# Patient Record
Sex: Female | Born: 1950
Health system: Southern US, Community
[De-identification: ages and names within clinical notes are randomized; demographics above are authoritative.]

## PROBLEM LIST (undated history)

## (undated) DIAGNOSIS — T8859XA Other complications of anesthesia, initial encounter: Secondary | ICD-10-CM

## (undated) DIAGNOSIS — Z9889 Other specified postprocedural states: Secondary | ICD-10-CM

## (undated) DIAGNOSIS — T4145XA Adverse effect of unspecified anesthetic, initial encounter: Secondary | ICD-10-CM

## (undated) DIAGNOSIS — E785 Hyperlipidemia, unspecified: Secondary | ICD-10-CM

## (undated) DIAGNOSIS — R112 Nausea with vomiting, unspecified: Secondary | ICD-10-CM

---

## 1997-12-23 ENCOUNTER — Other Ambulatory Visit: Admission: RE | Admit: 1997-12-23 | Discharge: 1997-12-23 | Payer: Self-pay | Admitting: Obstetrics & Gynecology

## 1999-02-02 ENCOUNTER — Other Ambulatory Visit: Admission: RE | Admit: 1999-02-02 | Discharge: 1999-02-02 | Payer: Self-pay | Admitting: Obstetrics & Gynecology

## 2000-06-19 ENCOUNTER — Other Ambulatory Visit: Admission: RE | Admit: 2000-06-19 | Discharge: 2000-06-19 | Payer: Self-pay | Admitting: Obstetrics & Gynecology

## 2001-02-26 HISTORY — PX: HEMORRHOID SURGERY: SHX153

## 2002-06-10 ENCOUNTER — Other Ambulatory Visit: Admission: RE | Admit: 2002-06-10 | Discharge: 2002-06-10 | Payer: Self-pay | Admitting: Obstetrics & Gynecology

## 2003-10-19 ENCOUNTER — Other Ambulatory Visit: Admission: RE | Admit: 2003-10-19 | Discharge: 2003-10-19 | Payer: Self-pay | Admitting: Obstetrics & Gynecology

## 2005-01-08 ENCOUNTER — Other Ambulatory Visit: Admission: RE | Admit: 2005-01-08 | Discharge: 2005-01-08 | Payer: Self-pay | Admitting: Obstetrics & Gynecology

## 2008-04-08 ENCOUNTER — Ambulatory Visit (HOSPITAL_COMMUNITY): Admission: RE | Admit: 2008-04-08 | Discharge: 2008-04-08 | Payer: Self-pay | Admitting: Surgery

## 2008-04-08 ENCOUNTER — Encounter (INDEPENDENT_AMBULATORY_CARE_PROVIDER_SITE_OTHER): Payer: Self-pay | Admitting: Surgery

## 2010-06-13 LAB — CBC
HCT: 32 % — ABNORMAL LOW (ref 36.0–46.0)
Hemoglobin: 10.2 g/dL — ABNORMAL LOW (ref 12.0–15.0)
MCHC: 31.9 g/dL (ref 30.0–36.0)
MCV: 72.5 fL — ABNORMAL LOW (ref 78.0–100.0)
RBC: 4.42 MIL/uL (ref 3.87–5.11)

## 2010-06-13 LAB — COMPREHENSIVE METABOLIC PANEL
ALT: 25 U/L (ref 0–35)
CO2: 29 mEq/L (ref 19–32)
Calcium: 9.6 mg/dL (ref 8.4–10.5)
Creatinine, Ser: 0.9 mg/dL (ref 0.4–1.2)
GFR calc non Af Amer: >60 mL/min (ref >60)
Glucose, Bld: 109 mg/dL — ABNORMAL HIGH (ref 70–99)

## 2010-06-13 LAB — DIFFERENTIAL
Eosinophils Absolute: 0.1 10*3/uL (ref 0.0–0.7)
Lymphocytes Relative: 34 % (ref 12–46)
Lymphs Abs: 1.8 10*3/uL (ref 0.7–4.0)
Neutrophils Relative %: 54 % (ref 43–77)

## 2010-07-11 NOTE — Op Note (Signed)
NAME:  Nichole Cunningham, Nichole Cunningham                ACCOUNT NO.:  [id number]   MEDICAL RECORD NO.:  [id number]          PATIENT TYPE:  AMB   LOCATION:  DAY                          FACILITY:  [facility]   PHYSICIAN:  Thomas A. Cornett, M.D.DATE OF BIRTH:  [birth year]   DATE OF PROCEDURE:  04/08/2008  DATE OF DISCHARGE:                               OPERATIVE REPORT   PREOPERATIVE DIAGNOSES:  Grade 3 complex internal and external  hemorrhoids.   POSTOPERATIVE DIAGNOSES:  Grade 3 complex internal and external  hemorrhoids.   PROCEDURE:  Three column internal/external hemorrhoidectomy.   SURGEON:  Maisie Fus A. Cornett, M.D.   ANESTHESIA:  General endotracheal anesthesia.   ESTIMATED BLOOD LOSS:  60 mL.   SPECIMEN:  Multiple hemorrhoid columns, sent to pathology.   DRAINS:  None.   INDICATIONS FOR PROCEDURE:  The patient has a longstanding history of  complex internal and external hemorrhoids.  She has been treated in the  past with sclerotherapy but hemorrhoids  have recurred.  She has a  significant external component and therefore we talked about those  being stapled . I went over the pros and cons of each.  She decided to  go ahead and proceed with a hemorrhoidectomy   DESCRIPTION OF PROCEDURE:  The patient was brought to the operating room  and intubated.  The patient was placed in jackknifeprone position and  appropriately  padded.  The buttocks were taped apart.  The perineum was  prepped and draped in a sterile fashion.  The digital exam was done and  tone was normal.  A stitchwas placed to the right posterior and inferior  hemmorhoid complexes.  The right posterior column was addressed first.  A  stitch  was placed  at the apex. We excised the hemmohoid both  internal and external components and removeda  skin tag in its entirety  with care  taken not to injure the sphincter mechanism.  This was easily  seen and we avoided this.  We closed the  mucosa with a running #3-0  Monocryl .  In a  similar fashion the other two areas were grasped with  #3-0 Monocryl with a running tack stitch.  The excess tissue excised,  preserving the sphincter muscle that we could see and _avoid.The right  anterior,  right posterior and left lateral piles were excised.  Once  the three columns were addressed and hemostasis achieved irrigation  within  the rectum was done which was clear without significant  bleeding.  Packing of Gelfoam wrap with Surgicel was placed in the anal  canal.  We injected the entire anal region  with 0.25%sensoricaine  locally with epinephrine. Xylocaine gel was then  applied.  The patient was then placed supine at this point.  All sponge,  needle and instrument counts were correct.   The patient was awakened and taken to recovery room in satisfactory  condition.      Thomas A. Cornett, M.D.  Electronically Signed     TAC/MEDQ  D:  04/08/2008  T:  04/08/2008  Job:  [id number]   cc:   Dr. __________

## 2010-09-07 ENCOUNTER — Encounter: Payer: Self-pay | Admitting: Internal Medicine

## 2010-11-30 ENCOUNTER — Other Ambulatory Visit: Payer: Self-pay | Admitting: Obstetrics & Gynecology

## 2011-04-01 ENCOUNTER — Other Ambulatory Visit: Payer: Self-pay

## 2011-04-01 ENCOUNTER — Emergency Department (HOSPITAL_COMMUNITY): Payer: Managed Care, Other (non HMO)

## 2011-04-01 ENCOUNTER — Encounter (HOSPITAL_COMMUNITY): Payer: Self-pay | Admitting: *Deleted

## 2011-04-01 ENCOUNTER — Emergency Department (HOSPITAL_COMMUNITY)
Admission: EM | Admit: 2011-04-01 | Discharge: 2011-04-01 | Disposition: A | Discharge status: Home / Self Care | Payer: Managed Care, Other (non HMO) | Attending: Emergency Medicine | Admitting: Emergency Medicine

## 2011-04-01 DIAGNOSIS — K829 Disease of gallbladder, unspecified: Secondary | ICD-10-CM | POA: Insufficient documentation

## 2011-04-01 DIAGNOSIS — E785 Hyperlipidemia, unspecified: Secondary | ICD-10-CM | POA: Insufficient documentation

## 2011-04-01 DIAGNOSIS — R079 Chest pain, unspecified: Secondary | ICD-10-CM | POA: Insufficient documentation

## 2011-04-01 DIAGNOSIS — I491 Atrial premature depolarization: Secondary | ICD-10-CM | POA: Insufficient documentation

## 2011-04-01 DIAGNOSIS — R10816 Epigastric abdominal tenderness: Secondary | ICD-10-CM | POA: Insufficient documentation

## 2011-04-01 DIAGNOSIS — M549 Dorsalgia, unspecified: Secondary | ICD-10-CM | POA: Insufficient documentation

## 2011-04-01 DIAGNOSIS — R1011 Right upper quadrant pain: Secondary | ICD-10-CM | POA: Insufficient documentation

## 2011-04-01 HISTORY — DX: Hyperlipidemia, unspecified: E78.5

## 2011-04-01 LAB — HEPATIC FUNCTION PANEL
ALT: 47 U/L — ABNORMAL HIGH (ref 0–35)
AST: 40 U/L — ABNORMAL HIGH (ref 0–37)
Albumin: 3.9 g/dL (ref 3.5–5.2)
Alkaline Phosphatase: 85 U/L (ref 39–117)
Total Protein: 7.1 g/dL (ref 6.0–8.3)

## 2011-04-01 LAB — DIFFERENTIAL
Eosinophils Relative: 2 % (ref 0–5)
Lymphocytes Relative: 27 % (ref 12–46)
Lymphs Abs: 2.5 10*3/uL (ref 0.7–4.0)
Neutro Abs: 6.4 10*3/uL (ref 1.7–7.7)

## 2011-04-01 LAB — BASIC METABOLIC PANEL
CO2: 27 mEq/L (ref 19–32)
Chloride: 102 mEq/L (ref 96–112)
Glucose, Bld: 156 mg/dL — ABNORMAL HIGH (ref 70–99)
Potassium: 4.1 mEq/L (ref 3.5–5.1)
Sodium: 139 mEq/L (ref 135–145)

## 2011-04-01 LAB — CBC
MCV: 90.4 fL (ref 78.0–100.0)
Platelets: 208 10*3/uL (ref 150–400)
RBC: 4.38 MIL/uL (ref 3.87–5.11)
WBC: 9.4 10*3/uL (ref 4.0–10.5)

## 2011-04-01 MED ORDER — SODIUM CHLORIDE 0.9 % IV SOLN
Freq: Once | INTRAVENOUS | Status: AC
  Administered 2011-04-01: 08:00:00 via INTRAVENOUS

## 2011-04-01 MED ORDER — PROMETHAZINE HCL 25 MG PO TABS: 25.0000 mg | ORAL_TABLET | Freq: Four times a day (QID) | ORAL | Status: DC | PRN

## 2011-04-01 MED ORDER — HYDROCODONE-ACETAMINOPHEN 5-325 MG PO TABS: 1.0000 | ORAL_TABLET | Freq: Four times a day (QID) | ORAL | Status: DC | PRN

## 2011-04-01 MED ORDER — HYDROMORPHONE HCL PF 1 MG/ML IJ SOLN
1.0000 mg | Freq: Once | INTRAMUSCULAR | Status: AC
  Administered 2011-04-01: 1 mg via INTRAVENOUS
  Filled 2011-04-01: qty 1

## 2011-04-01 MED ORDER — PANTOPRAZOLE SODIUM 40 MG IV SOLR
40.0000 mg | Freq: Once | INTRAVENOUS | Status: AC
  Administered 2011-04-01: 40 mg via INTRAVENOUS
  Filled 2011-04-01: qty 40

## 2011-04-01 MED ORDER — ONDANSETRON HCL 4 MG/2ML IJ SOLN
4.0000 mg | Freq: Once | INTRAMUSCULAR | Status: AC
  Administered 2011-04-01: 4 mg via INTRAVENOUS
  Filled 2011-04-01: qty 2

## 2011-04-01 NOTE — ED Notes (Signed)
Patient actively vomiting at this time. Dr Estell Harpin made aware and order for Zofran obtained.

## 2011-04-01 NOTE — ED Notes (Signed)
Pt arrived at department via EMS. Pt reports being awoken with pain in center of chest. Reports pain moves to left side somewhat, and into back.  Pt denies any nausea or SOB.  Nitro and ASA given by EMS.

## 2011-04-01 NOTE — ED Provider Notes (Signed)
History  Scribed for [name], MD, the patient was seen in room APA04/APA04. This chart was scribed by Candelaria Stagers. The patient's care started at 7:13AM      CSN: [id number]  Arrival date & time 04/01/11  2725   First MD Initiated Contact with Patient 04/01/11 [phone]      Chief Complaint  Patient presents with  . Chest Pain      Patient is a 61 y.o. female presenting with chest pain. The history is provided by the patient.  Chest Pain The chest pain began 1 - 2 hours ago. Chest pain occurs constantly. The pain radiates to the left arm. Pertinent negatives for primary symptoms include no fever, no cough and no nausea.  Pertinent negatives for associated symptoms include no diaphoresis. She tried nothing for the symptoms.  Her family medical history is significant for TIA in family.    Nichole Cunningham is a 61 y.o. female who presents to the Emergency Department complaining of constant chest pain in the center of the chest that started several hours ago.  Pt states the pain has moved to the left side of chest and that she is also experiencing back pain.  She denies nausea, diaphoresis, fever, or cough.  She reports that she has never experienced these sx before.  She is currently taking cholesterol medicines.  Pt's father died of heart attack in his early 82's.    Patient is a 61 y.o. female presenting with chest pain. The history is provided by the patient.    Past Medical History  Diagnosis Date  . Hyperlipidemia     History reviewed. No pertinent past surgical history.  History reviewed. No pertinent family history.  History  Substance Use Topics  . Smoking status: Never Smoker   . Smokeless tobacco: Not on file  . Alcohol Use: No    OB History    Grav Para Term Preterm Abortions TAB SAB Ect Mult Living                  Review of Systems  Constitutional: Negative for fever and diaphoresis.  Respiratory: Negative for cough.   Cardiovascular: Positive for  chest pain.  Gastrointestinal: Negative for nausea.  Musculoskeletal: Positive for back pain.  All other systems reviewed and are negative.    Allergies  Review of patient's allergies indicates no known allergies.  Home Medications   Current Outpatient Rx  Name Route Sig Dispense Refill  . ROSUVASTATIN CALCIUM 10 MG PO TABS Oral Take 10 mg by mouth daily.    Marland Kitchen ZOLPIDEM TARTRATE 5 MG PO TABS Oral Take 5 mg by mouth at bedtime as needed.      BP 122/91  Pulse 57  Temp 97.5 F (36.4 C)  Resp 16  Ht 5\' 3"  (1.6 m)  Wt 146 lb (66.225 kg)  BMI 25.86 kg/m2  SpO2 100%  Physical Exam  Nursing note and vitals reviewed. Constitutional: She is oriented to person, place, and time. She appears well-developed and well-nourished.  HENT:  Head: Atraumatic.  Eyes: EOM are normal. Right eye exhibits no discharge. Left eye exhibits no discharge.  Neck: Normal range of motion. Neck supple.  Pulmonary/Chest: Effort normal. She exhibits no tenderness.  Abdominal: Soft. There is tenderness (epigastric tenderness). There is no rebound.  Musculoskeletal: She exhibits no tenderness.       Baseline ROM, no obvious new focal weakness.  Neurological: She is alert and oriented to person, place, and time.  Skin: Skin  is warm and dry. No rash noted. She is not diaphoretic.  Psychiatric: She has a normal mood and affect. Her behavior is normal.    ED Course  Procedures   DIAGNOSTIC STUDIES: Oxygen Saturation is 100% on room air, normal by my interpretation.    COORDINATION OF CARE:  7:02AM Ordered: ED EKG ; Cardiac monitoring ; Pulse oximetry, continuous ; Saline lock IV ; CBC; Differential ; Basic metabolic panel ; I-Stat tropoinin I cardiac marker ; DG Chest Portable 1 View  7:19AM Ordered: Lipase, blood ; Hepatic function panel ; 0.9 % sodium chloride infusion ; HYDROmorphone (DILAUDID) injection 1 mg ; ondansetron (ZOFRAN) injection 4 mg ; pantoprazole (PROTONIX) injection 40 mg ; US Abdomen  Complete  8:07AM Ordered: ondansetron (ZOFRAN) injection 4 mg  10:29 AM  Recheck: discussed test results and course of care, recommended follow up with surgeon, pt is feeling nauseated.      Labs Reviewed  BASIC METABOLIC PANEL - Abnormal; Notable for the following:    Glucose, Bld 156 (*)    GFR calc non Af Amer 79 (*)    All other components within normal limits  HEPATIC FUNCTION PANEL - Abnormal; Notable for the following:    AST 40 (*)    ALT 47 (*)    All other components within normal limits  CBC  DIFFERENTIAL  LIPASE, BLOOD  POCT I-STAT TROPONIN I   US Abdomen Limited  04/01/2011  *RADIOLOGY REPORT*  Clinical Data:  Right upper quadrant abdominal pain  LIMITED ABDOMINAL ULTRASOUND - RIGHT UPPER QUADRANT  Comparison:  None.  Findings:  Gallbladder:  Dependent sludge noted without gallbladder wall thickening, pericholecystic fluid, or sonographic Murphy's sign.  Common bile duct:  Within normal limits in caliber.  Liver:  The liver is increased in echogenicity with poor sound through transmission, suggestive of fatty infiltration.A few areas of relative hypo echogenicity may indicate fatty sparing.  This is nonspecific however.  IMPRESSION: Sludge in the gallbladder without other sonographic evidence for acute cholecystitis.  Hepatic steatosis with areas of probable focal sparing.  If there is any underlying liver disease or primary malignancy, consider outpatient liver mass protocol MRI with contrast for better visualization of possible underlying lesions.  Original Report Authenticated By: Harrel Lemon, M.D.   Dg Chest Portable 1 View  04/01/2011  *RADIOLOGY REPORT*  Clinical Data: Chest pain  PORTABLE CHEST - 1 VIEW  Comparison: 04/08/2008  Findings: Cardiomediastinal silhouette is within normal limits. The lungs are clear. No pleural effusion.  No pneumothorax.  No acute osseous abnormality.  IMPRESSION: Normal chest.  Original Report Authenticated By: Harrel Lemon, M.D.       No diagnosis found.  Date: 04/01/2011  Rate: 67  Rhythm: premature atrial contractions (PAC)  QRS Axis: normal  Intervals: normal  ST/T Wave abnormalities: normal  Conduction Disutrbances:none  Narrative Interpretation:   Old EKG Reviewed: none available    MDM   Abd, pain,  Sludge in gb   The chart was scribed for me under my direct supervision.  I personally performed the history, physical, and medical decision making and all procedures in the evaluation of this patient.[name], MD 04/01/11 1038

## 2011-04-01 NOTE — ED Notes (Signed)
Patient transported to Ultrasound 

## 2011-04-01 NOTE — ED Notes (Signed)
New dark green tube drawn via venipuncture from right wrist. 2nd IStat troponin initiated. Patient tolerated procedure well.

## 2011-04-01 NOTE — ED Notes (Signed)
Patient ambulatory to restroom with steady gait. Patient reattached to cardiac monitor upon return to room.

## 2011-04-01 NOTE — ED Notes (Signed)
Patient with no complaints at this time. Respirations even and unlabored. Skin warm/dry. Discharge instructions reviewed with patient at this time. Patient given opportunity to voice concerns/ask questions. IV removed per policy and band-aid applied to site. Patient discharged at this time and left Emergency Department with steady gait.  

## 2011-04-06 ENCOUNTER — Encounter (HOSPITAL_COMMUNITY): Payer: Self-pay | Admitting: Pharmacy Technician

## 2011-04-11 ENCOUNTER — Encounter (HOSPITAL_COMMUNITY): Payer: Self-pay

## 2011-04-11 ENCOUNTER — Encounter (HOSPITAL_COMMUNITY)
Admission: RE | Admit: 2011-04-11 | Discharge: 2011-04-11 | Disposition: A | Discharge status: Home / Self Care | Payer: Managed Care, Other (non HMO) | Source: Ambulatory Visit | Attending: General Surgery | Admitting: General Surgery

## 2011-04-11 HISTORY — DX: Other complications of anesthesia, initial encounter: T88.59XA

## 2011-04-11 HISTORY — DX: Other specified postprocedural states: Z98.890

## 2011-04-11 HISTORY — DX: Nausea with vomiting, unspecified: R11.2

## 2011-04-11 HISTORY — DX: Adverse effect of unspecified anesthetic, initial encounter: T41.45XA

## 2011-04-11 NOTE — H&P (Signed)
  NTS SOAP Note  Vital Signs:  Vitals as of: 04/05/2011: Systolic 141: Diastolic 94: Heart Rate 75: Temp 99.56F: Height 15ft 3in: Weight 149Lbs 0 Ounces: OFC 0in: Respiratory Rate 0: O2 Saturation 0: Pain Level 3: BMI 26  BMI : 26.39 kg/m2  Subjective: This 7 Years 2 Months old Female presents forof R sided abdominal pain.  Has had slowly progressing symptoms.  Last sunday pain awoke her from sleep. Colicky in nature.  Associated nausea and emesis.  No change in BM.  No melena, hematochezia.  No diarrhea.  No constipation.  Has not noted any significant change with fatty foods.  Does have pressure pain occassionally.  No jaundice.  No family history of biliary disease.  Review of Symptoms:  Constitutional:unremarkable Head:unremarkable Eyes:unremarkable Nose/Mouth/Throat:unremarkable Cardiovascular:unremarkable Respiratory:unremarkable as per HPI Genitourinary:unremarkable arthralgia. Skin:unremarkable Breast:unremarkable Hematolgic/Lymphatic:unremarkable Allergic/Immunologic:unremarkable   Past Medical History:Obtained   Past Medical History  Pregnancy Gravida: 2 Pregnancy Para: 2 Surgical History: tubal Medical Problems: insomnia, hyperlipidemia Psychiatric History: none Allergies: NKDA Medications: Antara, ambien   Social History:Obtained   Social History  No EtOH No rec drugs.   Smoking Status: Never smoker reviewed on 04/08/2011  Family History:Obtained   Family History  noncontributory    Objective Information: General:Well appearing, well nourished in no distress. Skin:no rash or prominent lesions Head:Atraumatic; no masses; no abnormalities Eyes:conjunctiva clear, EOM intact, PERRL Mouth:Mucous membranes moist, no mucosal lesions. Neck:Supple without lymphadenopathy.  Heart:RRR, no murmur Lungs:CTA bilaterally, no wheezes, rhonchi, rales.  Breathing  unlabored. Abdomen:Soft, ND, no HSM, no masses. Mild RUQ pain. Extremities:No deformities, clubbing, cyanosis, or edema.   Assessment:  Diagnosis &amp; Procedure: DiagnosisCode: 574.00, ProcedureCode: 27253,    Plan: Cholelithiasis.  Surgical options discussed with the patient.  Will schedule at her convenience.    Patient Education:Alternative treatments to surgery were discussed with patient (and family).Risks and benefits  of procedure were fully explained to the patient (and family) who gave informed consent. Patient/family questions were addressed.  Follow-up:Pending Surgery

## 2011-04-11 NOTE — Patient Instructions (Signed)
20 MARASIA NEWHALL  04/11/2011   Your procedure is scheduled on:  04/13/11  Report to [facility] at 07:40 AM.  Call this number if you have problems the morning of surgery: [phone]   Remember:   Do not eat food:After Midnight.  May have clear liquids:until Midnight .  Clear liquids include soda, tea, black coffee, apple or grape juice, broth.  Take these medicines the morning of surgery with A SIP OF WATER: None   Do not wear jewelry, make-up or nail polish.  Do not wear lotions, powders, or perfumes. You may wear deodorant.  Do not shave 48 hours prior to surgery.  Do not bring valuables to the hospital.  Contacts, dentures or bridgework may not be worn into surgery.  Leave suitcase in the car. After surgery it may be brought to your room.  For patients admitted to the hospital, checkout time is 11:00 AM the day of discharge.   Patients discharged the day of surgery will not be allowed to drive home.  Name and phone number of your driver:   Special Instructions: CHG Shower Use Special Wash: 1/2 bottle night before surgery and 1/2 bottle morning of surgery.   Please read over the following fact sheets that you were given: Pain Booklet, MRSA Information, Surgical Site Infection Prevention, Anesthesia Post-op Instructions and Care and Recovery After Surgery    Laparoscopic Cholecystectomy Laparoscopic cholecystectomy is surgery to remove the gallbladder. The gallbladder is located slightly to the right of center in the abdomen, behind the liver. It is a concentrating and storage sac for the bile produced in the liver. Bile aids in the digestion and absorption of fats. Gallbladder disease (cholecystitis) is an inflammation of your gallbladder. This condition is usually caused by a buildup of gallstones (cholelithiasis) in your gallbladder. Gallstones can block the flow of bile, resulting in inflammation and pain. In severe cases, emergency surgery may be required. When emergency surgery is  not required, you will have time to prepare for the procedure. Laparoscopic surgery is an alternative to open surgery. Laparoscopic surgery usually has a shorter recovery time. Your common bile duct may also need to be examined and explored. Your caregiver will discuss this with you if he or she feels this should be done. If stones are found in the common bile duct, they may be removed. LET YOUR CAREGIVER KNOW ABOUT:  Allergies to food or medicine.   Medicines taken, including vitamins, herbs, eyedrops, over-the-counter medicines, and creams.   Use of steroids (by mouth or creams).   Previous problems with anesthetics or numbing medicines.   History of bleeding problems or blood clots.   Previous surgery.   Other health problems, including diabetes and kidney problems.   Possibility of pregnancy, if this applies.  RISKS AND COMPLICATIONS All surgery is associated with risks. Some problems that may occur following this procedure include:  Infection.   Damage to the common bile duct, nerves, arteries, veins, or other internal organs such as the stomach or intestines.   Bleeding.   A stone may remain in the common bile duct.  BEFORE THE PROCEDURE  Do not take aspirin for 3 days prior to surgery or blood thinners for 1 week prior to surgery.   Do not eat or drink anything after midnight the night before surgery.   Let your caregiver know if you develop a cold or other infectious problem prior to surgery.   You should be present 60 minutes before the procedure or as directed.  PROCEDURE  You will be given medicine that makes you sleep (general anesthetic). When you are asleep, your surgeon will make several small cuts (incisions) in your abdomen. One of these incisions is used to insert a small, lighted scope (laparoscope) into the abdomen. The laparoscope helps the surgeon see into your abdomen. Carbon dioxide gas will be pumped into your abdomen. The gas allows more room for the  surgeon to perform your surgery. Other operating instruments are inserted through the other incisions. Laparoscopic procedures may not be appropriate when:  There is major scarring from previous surgery.   The gallbladder is extremely inflamed.   There are bleeding disorders or unexpected cirrhosis of the liver.   A pregnancy is near term.   Other conditions make the laparoscopic procedure impossible.  If your surgeon feels it is not safe to continue with a laparoscopic procedure, he or she will perform an open abdominal procedure. In this case, the surgeon will make an incision to open the abdomen. This gives the surgeon a larger view and field to work within. This may allow the surgeon to perform procedures that sometimes cannot be performed with a laparoscope alone. Open surgery has a longer recovery time. AFTER THE PROCEDURE  You will be taken to the recovery area where a nurse will watch and check your progress.   You may be allowed to go home the same day.   Do not resume physical activities until directed by your caregiver.   You may resume a normal diet and activities as directed.  Document Released: 02/12/2005 Document Revised: 10/25/2010 Document Reviewed: 07/28/2010 [facility] Patient Information 2012 [place], [place].   PATIENT INSTRUCTIONS POST-ANESTHESIA  IMMEDIATELY FOLLOWING SURGERY:  Do not drive or operate machinery for the first twenty four hours after surgery.  Do not make any important decisions for twenty four hours after surgery or while taking narcotic pain medications or sedatives.  If you develop intractable nausea and vomiting or a severe headache please notify your doctor immediately.  FOLLOW-UP:  Please make an appointment with your surgeon as instructed. You do not need to follow up with anesthesia unless specifically instructed to do so.  WOUND CARE INSTRUCTIONS (if applicable):  Keep a dry clean dressing on the anesthesia/puncture wound site if there is  drainage.  Once the wound has quit draining you may leave it open to air.  Generally you should leave the bandage intact for twenty four hours unless there is drainage.  If the epidural site drains for more than 36-48 hours please call the anesthesia department.  QUESTIONS?:  Please feel free to call your physician or the hospital operator if you have any questions, and they will be happy to assist you.     [facility] Anesthesia Department [address] [place] [phone]

## 2011-04-13 ENCOUNTER — Encounter (HOSPITAL_COMMUNITY): Payer: Self-pay | Admitting: *Deleted

## 2011-04-13 ENCOUNTER — Ambulatory Visit (HOSPITAL_COMMUNITY)
Admission: RE | Admit: 2011-04-13 | Discharge: 2011-04-13 | Disposition: A | Discharge status: Home / Self Care | Payer: Managed Care, Other (non HMO) | Source: Ambulatory Visit | Attending: General Surgery | Admitting: General Surgery

## 2011-04-13 ENCOUNTER — Encounter (HOSPITAL_COMMUNITY): Payer: Self-pay | Admitting: Anesthesiology

## 2011-04-13 ENCOUNTER — Encounter (HOSPITAL_COMMUNITY)
Admission: RE | Disposition: A | Discharge status: Home / Self Care | Payer: Self-pay | Source: Ambulatory Visit | Attending: General Surgery

## 2011-04-13 ENCOUNTER — Ambulatory Visit (HOSPITAL_COMMUNITY): Payer: Managed Care, Other (non HMO) | Admitting: Anesthesiology

## 2011-04-13 ENCOUNTER — Other Ambulatory Visit: Payer: Self-pay | Admitting: General Surgery

## 2011-04-13 DIAGNOSIS — K810 Acute cholecystitis: Secondary | ICD-10-CM | POA: Insufficient documentation

## 2011-04-13 DIAGNOSIS — K819 Cholecystitis, unspecified: Secondary | ICD-10-CM

## 2011-04-13 DIAGNOSIS — Z01812 Encounter for preprocedural laboratory examination: Secondary | ICD-10-CM | POA: Insufficient documentation

## 2011-04-13 DIAGNOSIS — E785 Hyperlipidemia, unspecified: Secondary | ICD-10-CM | POA: Insufficient documentation

## 2011-04-13 HISTORY — PX: CHOLECYSTECTOMY: SHX55

## 2011-04-13 SURGERY — LAPAROSCOPIC CHOLECYSTECTOMY
Anesthesia: General | Site: Abdomen | Wound class: Clean Contaminated

## 2011-04-13 MED ORDER — FENTANYL CITRATE 0.05 MG/ML IJ SOLN
INTRAMUSCULAR | Status: AC
  Administered 2011-04-13: 50 ug via INTRAVENOUS
  Filled 2011-04-13: qty 2

## 2011-04-13 MED ORDER — LACTATED RINGERS IV SOLN
INTRAVENOUS | Status: DC
  Administered 2011-04-13: 1000 mL via INTRAVENOUS

## 2011-04-13 MED ORDER — FENTANYL CITRATE 0.05 MG/ML IJ SOLN
INTRAMUSCULAR | Status: DC | PRN
  Administered 2011-04-13 (×4): 50 ug via INTRAVENOUS

## 2011-04-13 MED ORDER — FENTANYL CITRATE 0.05 MG/ML IJ SOLN
25.0000 ug | INTRAMUSCULAR | Status: DC | PRN
  Administered 2011-04-13 (×2): 50 ug via INTRAVENOUS

## 2011-04-13 MED ORDER — PROMETHAZINE HCL 25 MG/ML IJ SOLN
12.5000 mg | Freq: Once | INTRAMUSCULAR | Status: AC
  Administered 2011-04-13: 12.5 mg via INTRAVENOUS

## 2011-04-13 MED ORDER — GLYCOPYRROLATE 0.2 MG/ML IJ SOLN
INTRAMUSCULAR | Status: AC
  Filled 2011-04-13: qty 1

## 2011-04-13 MED ORDER — CEFAZOLIN SODIUM 1-5 GM-% IV SOLN
INTRAVENOUS | Status: DC | PRN
  Administered 2011-04-13: 1 g via INTRAVENOUS

## 2011-04-13 MED ORDER — CELECOXIB 100 MG PO CAPS
400.0000 mg | ORAL_CAPSULE | Freq: Every day | ORAL | Status: AC
  Administered 2011-04-13: 400 mg via ORAL

## 2011-04-13 MED ORDER — MIDAZOLAM HCL 2 MG/2ML IJ SOLN
1.0000 mg | INTRAMUSCULAR | Status: DC | PRN
  Administered 2011-04-13 (×2): 2 mg via INTRAVENOUS

## 2011-04-13 MED ORDER — BUPIVACAINE HCL (PF) 0.5 % IJ SOLN
INTRAMUSCULAR | Status: DC | PRN
  Administered 2011-04-13: 10 mL

## 2011-04-13 MED ORDER — BUPIVACAINE HCL (PF) 0.5 % IJ SOLN
INTRAMUSCULAR | Status: AC
  Filled 2011-04-13: qty 30

## 2011-04-13 MED ORDER — ONDANSETRON HCL 4 MG/2ML IJ SOLN
INTRAMUSCULAR | Status: AC
  Administered 2011-04-13: 4 mg via INTRAVENOUS
  Filled 2011-04-13: qty 2

## 2011-04-13 MED ORDER — ENOXAPARIN SODIUM 40 MG/0.4ML ~~LOC~~ SOLN
40.0000 mg | Freq: Once | SUBCUTANEOUS | Status: AC
  Administered 2011-04-13: 40 mg via SUBCUTANEOUS

## 2011-04-13 MED ORDER — PROMETHAZINE HCL 25 MG/ML IJ SOLN
INTRAMUSCULAR | Status: AC
  Administered 2011-04-13: 12.5 mg via INTRAVENOUS
  Filled 2011-04-13: qty 1

## 2011-04-13 MED ORDER — CELECOXIB 100 MG PO CAPS
ORAL_CAPSULE | ORAL | Status: AC
  Administered 2011-04-13: 400 mg via ORAL
  Filled 2011-04-13: qty 4

## 2011-04-13 MED ORDER — ROCURONIUM BROMIDE 100 MG/10ML IV SOLN
INTRAVENOUS | Status: DC | PRN
  Administered 2011-04-13: 5 mg via INTRAVENOUS
  Administered 2011-04-13: 25 mg via INTRAVENOUS

## 2011-04-13 MED ORDER — FENTANYL CITRATE 0.05 MG/ML IJ SOLN
INTRAMUSCULAR | Status: AC
  Filled 2011-04-13: qty 2

## 2011-04-13 MED ORDER — ONDANSETRON HCL 4 MG/2ML IJ SOLN
4.0000 mg | Freq: Once | INTRAMUSCULAR | Status: AC
  Administered 2011-04-13: 4 mg via INTRAVENOUS

## 2011-04-13 MED ORDER — ONDANSETRON HCL 4 MG/2ML IJ SOLN: 4.0000 mg | Freq: Once | INTRAMUSCULAR | Status: DC | PRN

## 2011-04-13 MED ORDER — CEFAZOLIN SODIUM 1-5 GM-% IV SOLN: 1.0000 g | INTRAVENOUS | Status: DC

## 2011-04-13 MED ORDER — GLYCOPYRROLATE 0.2 MG/ML IJ SOLN
0.2000 mg | Freq: Once | INTRAMUSCULAR | Status: AC
  Administered 2011-04-13: 0.2 mg via INTRAVENOUS

## 2011-04-13 MED ORDER — MIDAZOLAM HCL 2 MG/2ML IJ SOLN
INTRAMUSCULAR | Status: AC
  Filled 2011-04-13: qty 2

## 2011-04-13 MED ORDER — ACETAMINOPHEN 325 MG PO TABS: 325.0000 mg | ORAL_TABLET | ORAL | Status: DC | PRN

## 2011-04-13 MED ORDER — LIDOCAINE HCL 1 % IJ SOLN
INTRAMUSCULAR | Status: DC | PRN
  Administered 2011-04-13: 30 mg via INTRADERMAL

## 2011-04-13 MED ORDER — SODIUM CHLORIDE 0.9 % IR SOLN
Status: DC | PRN
  Administered 2011-04-13: 3000 mL

## 2011-04-13 MED ORDER — ENOXAPARIN SODIUM 40 MG/0.4ML ~~LOC~~ SOLN
SUBCUTANEOUS | Status: AC
  Administered 2011-04-13: 40 mg via SUBCUTANEOUS
  Filled 2011-04-13: qty 0.4

## 2011-04-13 MED ORDER — GLYCOPYRROLATE 0.2 MG/ML IJ SOLN
0.1000 mg | Freq: Once | INTRAMUSCULAR | Status: AC
  Administered 2011-04-13: 0.2 mg via INTRAVENOUS

## 2011-04-13 MED ORDER — GLYCOPYRROLATE 0.2 MG/ML IJ SOLN
INTRAMUSCULAR | Status: AC
  Administered 2011-04-13: 0.2 mg via INTRAVENOUS
  Filled 2011-04-13: qty 1

## 2011-04-13 MED ORDER — CEFAZOLIN SODIUM 1-5 GM-% IV SOLN
INTRAVENOUS | Status: AC
  Filled 2011-04-13: qty 50

## 2011-04-13 MED ORDER — GLYCOPYRROLATE 0.2 MG/ML IJ SOLN
INTRAMUSCULAR | Status: DC | PRN
  Administered 2011-04-13: .6 mg via INTRAVENOUS

## 2011-04-13 MED ORDER — PROPOFOL 10 MG/ML IV BOLUS
INTRAVENOUS | Status: DC | PRN
  Administered 2011-04-13: 130 mg via INTRAVENOUS

## 2011-04-13 MED ORDER — MIDAZOLAM HCL 2 MG/2ML IJ SOLN
INTRAMUSCULAR | Status: AC
  Administered 2011-04-13: 2 mg via INTRAVENOUS
  Filled 2011-04-13: qty 2

## 2011-04-13 MED ORDER — SODIUM CHLORIDE 0.9 % IR SOLN
Status: DC | PRN
  Administered 2011-04-13: 1000 mL

## 2011-04-13 MED ORDER — NEOSTIGMINE METHYLSULFATE 1 MG/ML IJ SOLN
INTRAMUSCULAR | Status: DC | PRN
  Administered 2011-04-13: 3 mg via INTRAVENOUS

## 2011-04-13 MED ORDER — SODIUM CHLORIDE 0.9 % IJ SOLN
INTRAMUSCULAR | Status: AC
  Filled 2011-04-13: qty 10

## 2011-04-13 MED ORDER — HYDROCODONE-ACETAMINOPHEN 5-325 MG PO TABS: 1.0000 | ORAL_TABLET | ORAL | Status: AC | PRN

## 2011-04-13 SURGICAL SUPPLY — 36 items
APL SKNCLS STERI-STRIP NONHPOA (GAUZE/BANDAGES/DRESSINGS) ×1
APPLIER CLIP UNV 5X34 EPIX (ENDOMECHANICALS) ×2 IMPLANT
APR XCLPCLP 20M/L UNV 34X5 (ENDOMECHANICALS) ×1
BAG HAMPER (MISCELLANEOUS) ×2 IMPLANT
BAG SPEC RTRVL LRG 6X4 10 (ENDOMECHANICALS) ×1
BENZOIN TINCTURE PRP APPL 2/3 (GAUZE/BANDAGES/DRESSINGS) ×2 IMPLANT
CLOTH BEACON ORANGE TIMEOUT ST (SAFETY) ×2 IMPLANT
COVER LIGHT HANDLE STERIS (MISCELLANEOUS) ×4 IMPLANT
DECANTER SPIKE VIAL GLASS SM (MISCELLANEOUS) ×2 IMPLANT
DEVICE TROCAR PUNCTURE CLOSURE (ENDOMECHANICALS) ×2 IMPLANT
DURAPREP 26ML APPLICATOR (WOUND CARE) ×2 IMPLANT
ELECT REM PT RETURN 9FT ADLT (ELECTROSURGICAL) ×2
ELECTRODE REM PT RTRN 9FT ADLT (ELECTROSURGICAL) ×1 IMPLANT
FILTER SMOKE EVAC LAPAROSHD (FILTER) ×2 IMPLANT
FORMALIN 10 PREFIL 120ML (MISCELLANEOUS) ×2 IMPLANT
GLOVE BIOGEL PI IND STRL 7.5 (GLOVE) ×1 IMPLANT
GLOVE BIOGEL PI INDICATOR 7.5 (GLOVE) ×1
GLOVE ECLIPSE 6.5 STRL STRAW (GLOVE) ×3 IMPLANT
GLOVE ECLIPSE 7.0 STRL STRAW (GLOVE) ×5 IMPLANT
GLOVE EXAM NITRILE MD LF STRL (GLOVE) ×1 IMPLANT
GOWN STRL REIN XL XLG (GOWN DISPOSABLE) ×7 IMPLANT
HEMOSTAT SNOW SURGICEL 2X4 (HEMOSTASIS) ×1 IMPLANT
INST SET LAPROSCOPIC AP (KITS) ×2 IMPLANT
IV NS IRRIG 3000ML ARTHROMATIC (IV SOLUTION) ×2 IMPLANT
KIT ROOM TURNOVER APOR (KITS) ×2 IMPLANT
KIT TROCAR LAP CHOLE (TROCAR) ×2 IMPLANT
MANIFOLD NEPTUNE II (INSTRUMENTS) ×2 IMPLANT
PACK LAP CHOLE LZT030E (CUSTOM PROCEDURE TRAY) ×2 IMPLANT
PAD ARMBOARD 7.5X6 YLW CONV (MISCELLANEOUS) ×2 IMPLANT
POUCH SPECIMEN RETRIEVAL 10MM (ENDOMECHANICALS) ×2 IMPLANT
SET BASIN LINEN APH (SET/KITS/TRAYS/PACK) ×2 IMPLANT
SET TUBE IRRIG SUCTION NO TIP (IRRIGATION / IRRIGATOR) ×1 IMPLANT
STRIP CLOSURE SKIN 1/2X4 (GAUZE/BANDAGES/DRESSINGS) ×2 IMPLANT
SUT MNCRL AB 4-0 PS2 18 (SUTURE) ×4 IMPLANT
SUT VIC AB 2-0 CT2 27 (SUTURE) ×3 IMPLANT
WARMER LAPAROSCOPE (MISCELLANEOUS) ×2 IMPLANT

## 2011-04-13 NOTE — Transfer of Care (Signed)
Immediate Anesthesia Transfer of Care Note  Patient: Nichole Cunningham  Procedure(s) Performed: Procedure(s) (LRB): LAPAROSCOPIC CHOLECYSTECTOMY (N/A)  Patient Location: PACU  Anesthesia Type: General  Level of Consciousness: awake  Airway & Oxygen Therapy: Patient Spontanous Breathing and Patient connected to face mask oxygen  Post-op Assessment: Report given to PACU RN  Post vital signs: Reviewed and stable  Complications: No apparent anesthesia complications

## 2011-04-13 NOTE — Anesthesia Postprocedure Evaluation (Signed)
  Anesthesia Post-op Note  Patient: Nichole Cunningham  Procedure(s) Performed: Procedure(s) (LRB): LAPAROSCOPIC CHOLECYSTECTOMY (N/A)  Patient Location: PACU  Anesthesia Type: General  Level of Consciousness: awake  Airway and Oxygen Therapy: Patient Spontanous Breathing and Patient connected to face mask oxygen  Post-op Pain: none  Post-op Assessment: Post-op Vital signs reviewed, Patient's Cardiovascular Status Stable, Respiratory Function Stable and Patent Airway  Post-op Vital Signs: Reviewed and stable  Complications: No apparent anesthesia complications

## 2011-04-13 NOTE — Anesthesia Preprocedure Evaluation (Signed)
Anesthesia Evaluation  Patient identified by MRN, date of birth, ID band Patient awake    Reviewed: Allergy & Precautions, H&P , NPO status , Patient's Chart, lab work & pertinent test results  History of Anesthesia Complications (+) PONV  Airway Mallampati: I TM Distance: >3 FB Neck ROM: Full    Dental No notable dental hx.    Pulmonary neg pulmonary ROS,    Pulmonary exam normal       Cardiovascular neg cardio ROS Regular Normal    Neuro/Psych Negative Neurological ROS  Negative Psych ROS   GI/Hepatic negative GI ROS, Neg liver ROS,   Endo/Other  Negative Endocrine ROS  Renal/GU negative Renal ROS  Genitourinary negative   Musculoskeletal negative musculoskeletal ROS (+)   Abdominal Normal abdominal exam  (+)   Peds  Hematology negative hematology ROS (+)   Anesthesia Other Findings   Reproductive/Obstetrics negative OB ROS                           Anesthesia Physical Anesthesia Plan  ASA: I  Anesthesia Plan: General   Post-op Pain Management:    Induction: Intravenous  Airway Management Planned: Oral ETT  Additional Equipment:   Intra-op Plan:   Post-operative Plan: Extubation in OR  Informed Consent: I have reviewed the patients History and Physical, chart, labs and discussed the procedure including the risks, benefits and alternatives for the proposed anesthesia with the patient or authorized representative who has indicated his/her understanding and acceptance.   Dental advisory given  Plan Discussed with: CRNA  Anesthesia Plan Comments:         Anesthesia Quick Evaluation

## 2011-04-13 NOTE — Op Note (Signed)
Patient:  Nichole Cunningham  DOB:  11-11-1950  MRN:  [id number]   Preop Diagnosis:  Acute cholecystitis   Postop Diagnosis:  Same  Procedure:  Laparoscopic cholecystectomy  Surgeon:  Dr. Tilford Pillar  Anes:  General endotracheal, 0.5% Sensorcaine plain  Indications:  Patient is a 60 year old female presented my office with a history of right upper quadrant epigastric abdominal pain. Workup and evaluation was consistent for cholecystitis. Risks benefits alternatives of a laparoscopic possible open cholecystectomy were discussed at length with the patient including but not limited to risk of bleeding, infection, bile leak, small bowel injury, common bile duct injury, intraoperative cardiac and pulmonary events. Patient's questions and concerns were addressed the patient was consented for the planned procedure.  Procedure note:  Patient was taken to the operating room placed in supine position on the OR table. At this time general anesthetic was a minister was patient was asleep she was endotracheally intubated by the nurse anesthetist. At this point her abdomen was prepped with DuraPrep solution and draped in standard fashion. Stab incision was created supraumbilically with 11 blade scalpel. Additional dissection down to subcuticular tissues carried out using a Coker clamp which is utilized to grasp the anterior abdominal fascia and lift this anteriorly. A Veress needle is inserted saline drop test is utilized confirm intraperitoneal placement. Pneumoperitoneum was initiated once sufficient pneumoperitoneum was obtained an 11 mm process or over laparoscopic allowing visualization of the trocar entering into the peritoneal cavity. At this point the inner cannulas removed the laparoscope was reinserted there is no evidence of any trocar or Veress needle placement injury. At this time the remaining trochars replaced with a 5 mm can epigastrium, a 5 mm in the midline, and a 5 mm in the right lateral  abdominal wall. At this point patient please and her first Trendelenburg left lateral decubitus position. The fundus of the gallbladder was grasped and lifted up and over the right lobe the liver. Some omental effusions adherent to the body of the gallbladder bluntly stripped using a Art gallery manager. The peritoneal reflection onto the infundibulum was bluntly stripped using a Art gallery manager. A window was created behind the cystic duct and cystic artery. 3 endoclips placed proximally one distally and the cystic duct which was divided between 2 most distal clips. 2 endoclips placed proximally one distally and the cystic artery was divided between 2 most distal clips. At this point r utilized dissect gallbladder free from the gallbladder fossa. Once free the 10 mm scope is exchanged for a 5 mm scope and the Endo Catch bag was inserted into the peritoneal cavity. The gallbladder was placed into the Endo Catch bag was placed into the right lower quadrant. Inspection of the gallbladder fossa indicated excellent hemostasis. I did irrigate the daughter fossa until the returning aspirate was clear. Any clips are noted be in excellent position with no evidence of any bleeding or bile leak. At this time return my attention to closure.  Using an Endo Close suture passing device a 2-0 Vicryl sutures passed to the umbilical trocar site. With this suture and placed the gallbladder was retrieved was removed through the umbilical trocar site and intact Endo Catch bag. The gallbladder was sent as a permanent specimen to pathology. At this point the pneumoperitoneum was evacuated trochars removed the Vicryl suture was secured. Local anesthetic is instilled. A 4-0 Monocryl was utilized to reapproximate the skin edges at all 4 trocar sites. The skin was washed dried moist dry towel. Benzoin is applied  around all 4 trocar sites half-inch Steri-Strips are placed. Patient was allowed to come out of general anesthetic and stretcher  the PACU in stable condition. At the conclusion of procedure all instrument, sponge, needle counts are correct. Patient tolerated procedure extremely well.  Complications:  None  EBL:  Minimal  Specimen:  Gallbladder

## 2011-04-13 NOTE — Interval H&P Note (Signed)
History and Physical Interval Note:  04/13/2011 10:10 AM  Nichole Cunningham  has presented today for surgery, with the diagnosis of Calculus of gallbladder with other cholecystitis, without mention of obstruction [574.10]  The various methods of treatment have been discussed with the patient and family. After consideration of risks, benefits and other options for treatment, the patient has consented to  Procedure(s) (LRB): LAPAROSCOPIC CHOLECYSTECTOMY (N/A) as a surgical intervention .  The patients' history has been reviewed, patient examined, no change in status, stable for surgery.  I have reviewed the patients' chart and labs.  Questions were answered to the patient's satisfaction.     [name] C

## 2011-04-13 NOTE — Anesthesia Procedure Notes (Signed)
Procedure Name: Intubation Date/Time: 04/13/2011 10:41 AM Performed by: Glynn Octave Pre-anesthesia Checklist: Patient identified, Patient being monitored, Timeout performed, Emergency Drugs available and Suction available Patient Re-evaluated:Patient Re-evaluated prior to inductionOxygen Delivery Method: Circle System Utilized Preoxygenation: Pre-oxygenation with 100% oxygen Intubation Type: IV induction Ventilation: Mask ventilation without difficulty Laryngoscope Size: Mac and 3 Grade View: Grade II Tube type: Oral Tube size: 7.0 mm Number of attempts: 1 Airway Equipment and Method: stylet Placement Confirmation: ETT inserted through vocal cords under direct vision,  positive ETCO2 and breath sounds checked- equal and bilateral Secured at: 21 cm Tube secured with: Tape Dental Injury: Teeth and Oropharynx as per pre-operative assessment

## 2011-04-17 ENCOUNTER — Encounter (HOSPITAL_COMMUNITY): Payer: Self-pay | Admitting: General Surgery

## 2011-11-15 ENCOUNTER — Encounter: Payer: Self-pay | Admitting: Internal Medicine

## 2012-01-16 ENCOUNTER — Encounter: Payer: Self-pay | Admitting: Internal Medicine

## 2012-02-29 ENCOUNTER — Encounter: Payer: Self-pay | Admitting: Internal Medicine

## 2012-02-29 ENCOUNTER — Ambulatory Visit (AMBULATORY_SURGERY_CENTER): Payer: BC Managed Care – PPO | Admitting: *Deleted

## 2012-02-29 VITALS — Ht 62.0 in | Wt 154.6 lb

## 2012-02-29 DIAGNOSIS — Z1211 Encounter for screening for malignant neoplasm of colon: Secondary | ICD-10-CM

## 2012-02-29 MED ORDER — MOVIPREP 100 G PO SOLR: ORAL | Status: DC

## 2012-03-03 ENCOUNTER — Encounter: Payer: Self-pay | Admitting: Internal Medicine

## 2012-03-14 ENCOUNTER — Ambulatory Visit (AMBULATORY_SURGERY_CENTER): Payer: BC Managed Care – PPO | Admitting: Internal Medicine

## 2012-03-14 ENCOUNTER — Encounter: Payer: Self-pay | Admitting: Internal Medicine

## 2012-03-14 VITALS — BP 139/89 | HR 87 | Temp 99.3°F | Resp 15 | Ht 62.0 in | Wt 154.0 lb

## 2012-03-14 DIAGNOSIS — Z1211 Encounter for screening for malignant neoplasm of colon: Secondary | ICD-10-CM

## 2012-03-14 MED ORDER — SODIUM CHLORIDE 0.9 % IV SOLN: 500.0000 mL | INTRAVENOUS | Status: DC

## 2012-03-14 NOTE — Progress Notes (Signed)
1229 a/ox3 pleased report to [employer]

## 2012-03-14 NOTE — Patient Instructions (Addendum)
Findings:  Normal colon Recommendations:  High Fiber Diet, Repeat colonoscopy in 10 years.  YOU HAD AN ENDOSCOPIC PROCEDURE TODAY AT THE [facility]: Refer to the procedure report that was given to you for any specific questions about what was found during the examination.  If the procedure report does not answer your questions, please call your gastroenterologist to clarify.  If you requested that your care partner not be given the details of your procedure findings, then the procedure report has been included in a sealed envelope for you to review at your convenience later.  YOU SHOULD EXPECT: Some feelings of bloating in the abdomen. Passage of more gas than usual.  Walking can help get rid of the air that was put into your GI tract during the procedure and reduce the bloating. If you had a lower endoscopy (such as a colonoscopy or flexible sigmoidoscopy) you may notice spotting of blood in your stool or on the toilet paper. If you underwent a bowel prep for your procedure, then you may not have a normal bowel movement for a few days.  DIET: Your first meal following the procedure should be a light meal and then it is ok to progress to your normal diet.  A half-sandwich or bowl of soup is an example of a good first meal.  Heavy or fried foods are harder to digest and may make you feel nauseous or bloated.  Likewise meals heavy in dairy and vegetables can cause extra gas to form and this can also increase the bloating.  Drink plenty of fluids but you should avoid alcoholic beverages for 24 hours.  ACTIVITY: Your care partner should take you home directly after the procedure.  You should plan to take it easy, moving slowly for the rest of the day.  You can resume normal activity the day after the procedure however you should NOT DRIVE or use heavy machinery for 24 hours (because of the sedation medicines used during the test).    SYMPTOMS TO REPORT IMMEDIATELY: A gastroenterologist can be  reached at any hour.  During normal business hours, 8:30 AM to 5:00 PM Monday through Friday, call [phone].  After hours and on weekends, please call the GI answering service at [phone] who will take a message and have the physician on call contact you.   Following lower endoscopy (colonoscopy or flexible sigmoidoscopy):  Excessive amounts of blood in the stool  Significant tenderness or worsening of abdominal pains  Swelling of the abdomen that is new, acute  Fever of 100F or higher  Following upper endoscopy (EGD)  Vomiting of blood or coffee ground material  New chest pain or pain under the shoulder blades  Painful or persistently difficult swallowing  New shortness of breath  Fever of 100F or higher  Black, tarry-looking stools  FOLLOW UP: If any biopsies were taken you will be contacted by phone or by letter within the next 1-3 weeks.  Call your gastroenterologist if you have not heard about the biopsies in 3 weeks.  Our staff will call the home number listed on your records the next business day following your procedure to check on you and address any questions or concerns that you may have at that time regarding the information given to you following your procedure. This is a courtesy call and so if there is no answer at the home number and we have not heard from you through the emergency physician on call, we will assume that you  have returned to your regular daily activities without incident.  SIGNATURES/CONFIDENTIALITY: You and/or your care partner have signed paperwork which will be entered into your electronic medical record.  These signatures attest to the fact that that the information above on your After Visit Summary has been reviewed and is understood.  Full responsibility of the confidentiality of this discharge information lies with you and/or your care-partner.   Please follow all discharge instructions given to you by the recovery room nurse. If you have  any questions or problems after discharge please call one of the numbers listed above. You will receive a phone call in the am to see how you are doing and answer any questions you may have. Thank you for choosing [facility] for your health care needs.

## 2012-03-14 NOTE — Progress Notes (Signed)
Patient did not experience any of the following events: a burn prior to discharge; a fall within the facility; wrong site/side/patient/procedure/implant event; or a hospital transfer or hospital admission upon discharge from the facility. (G8907) Patient did not have preoperative order for IV antibiotic SSI prophylaxis. (G8918)  

## 2012-03-14 NOTE — Op Note (Signed)
[facility] [address] [place], [address]   COLONOSCOPY PROCEDURE REPORT  PATIENT: Nichole, Cunningham.  MR#: [id number] BIRTHDATE: 1950/03/06 , 61  yrs. old GENDER: Female ENDOSCOPIST: Hart Carwin, MD REFERRED BY:  Rudi Heap, M.D. PROCEDURE DATE:  03/14/2012 PROCEDURE:   Colonoscopy, screening ASA CLASS:   Class II INDICATIONS:Average risk patient for colon cancer and last colonoscopy 2--3 was normal except for int.  hemorrhoids. MEDICATIONS: MAC sedation, administered by CRNA and Propofol (Diprivan) 180 mg IV  DESCRIPTION OF PROCEDURE:   After the risks and benefits and of the procedure were explained, informed consent was obtained.  A digital rectal exam revealed no abnormalities of the rectum.    The LB [id number]-[id number]  endoscope was introduced through the anus and advanced to the cecum, which was identified by both the appendix and ileocecal valve .  The quality of the prep was good, using MoviPrep .  The instrument was then slowly withdrawn as the colon was fully examined.     COLON FINDINGS: A normal appearing cecum, ileocecal valve, and appendiceal orifice were identified.  The ascending, hepatic flexure, transverse, splenic flexure, descending, sigmoid colon and rectum appeared unremarkable.  No polyps or cancers were seen. The scope was then withdrawn from the patient and the procedure completed.  COMPLICATIONS: There were no complications. ENDOSCOPIC IMPRESSION: Normal colon  RECOMMENDATIONS: High fiber diet   REPEAT EXAM: In 10 year(s)  for Colonoscopy.  cc:  _______________________________ eSignedHart Carwin, MD 03/14/2012 12:27 PM

## 2012-03-17 ENCOUNTER — Telehealth: Payer: Self-pay

## 2012-03-17 NOTE — Telephone Encounter (Signed)
Telephone call 2 x in error. Maw

## 2012-03-17 NOTE — Telephone Encounter (Signed)
  Follow up Call-  Call back number 03/14/2012  Post procedure Call Back phone  # [phone]  Permission to leave phone message Yes     Patient questions:  Do you have a fever, pain , or abdominal swelling? no Pain Score  0 *  Have you tolerated food without any problems? yes  Have you been able to return to your normal activities? yes  Do you have any questions about your discharge instructions: Diet   no Medications  no Follow up visit  no  Do you have questions or concerns about your Care? no  Actions: * If pain score is 4 or above: No action needed, pain <4.  No problems per the pt. Maw

## 2012-12-30 ENCOUNTER — Other Ambulatory Visit: Payer: Self-pay | Admitting: Obstetrics & Gynecology

## 2013-08-17 ENCOUNTER — Encounter: Payer: Self-pay | Admitting: Family Medicine

## 2013-08-17 ENCOUNTER — Encounter (INDEPENDENT_AMBULATORY_CARE_PROVIDER_SITE_OTHER): Payer: Self-pay

## 2013-08-17 ENCOUNTER — Ambulatory Visit (INDEPENDENT_AMBULATORY_CARE_PROVIDER_SITE_OTHER): Payer: 59

## 2013-08-17 ENCOUNTER — Ambulatory Visit (INDEPENDENT_AMBULATORY_CARE_PROVIDER_SITE_OTHER): Payer: 59 | Admitting: Family Medicine

## 2013-08-17 VITALS — BP 136/80 | HR 81 | Temp 97.3°F | Ht 62.0 in | Wt 148.4 lb

## 2013-08-17 DIAGNOSIS — M545 Low back pain, unspecified: Secondary | ICD-10-CM

## 2013-08-17 DIAGNOSIS — N390 Urinary tract infection, site not specified: Secondary | ICD-10-CM

## 2013-08-17 DIAGNOSIS — R1031 Right lower quadrant pain: Secondary | ICD-10-CM

## 2013-08-17 DIAGNOSIS — G8929 Other chronic pain: Secondary | ICD-10-CM

## 2013-08-17 LAB — POCT UA - MICROSCOPIC ONLY
Bacteria, U Microscopic: NEGATIVE
Casts, Ur, LPF, POC: NEGATIVE
Mucus, UA: NEGATIVE
RBC, urine, microscopic: NEGATIVE
Yeast, UA: NEGATIVE

## 2013-08-17 LAB — POCT URINALYSIS DIPSTICK
Bilirubin, UA: NEGATIVE
Blood, UA: NEGATIVE
Glucose, UA: NEGATIVE
Ketones, UA: NEGATIVE
Nitrite, UA: NEGATIVE
Protein, UA: NEGATIVE
Spec Grav, UA: >=1.03
Urobilinogen, UA: NEGATIVE
pH, UA: 5

## 2013-08-17 MED ORDER — NAPROXEN 500 MG PO TABS: 500.0000 mg | ORAL_TABLET | Freq: Two times a day (BID) | ORAL | Status: DC

## 2013-08-17 MED ORDER — CIPROFLOXACIN HCL 500 MG PO TABS: 500.0000 mg | ORAL_TABLET | Freq: Two times a day (BID) | ORAL | Status: DC

## 2013-08-17 MED ORDER — CYCLOBENZAPRINE HCL 10 MG PO TABS: 10.0000 mg | ORAL_TABLET | Freq: Three times a day (TID) | ORAL | Status: DC | PRN

## 2013-08-17 NOTE — Progress Notes (Signed)
   Subjective:    Patient ID: Nichole Cunningham, female    DOB: 11-Sep-1950, 63 y.o.   MRN: [id number]  HPI This 63 y.o. female presents for evaluation of back pain and abdominal pain for one day. She denies any dysuria.   Review of Systems C/o back pain No chest pain, SOB, HA, dizziness, vision change, N/V, diarrhea, constipation, dysuria, urinary urgency or frequency or rash.     Objective:   Physical Exam   Vital signs noted  Well developed well nourished female.  HEENT - Head atraumatic Normocephalic                Eyes - PERRLA, Conjuctiva - clear Sclera- Clear EOMI                Ears - EAC's Wnl TM's Wnl Gross Hearing WNL                Throat - oropharanx wnl Respiratory - Lungs CTA bilateral Cardiac - RRR S1 and S2 without murmur GI - Abdomen soft tender RLQ and bowel sounds active x 4 Extremities - No edema. Neuro - Grossly intact. MS - TTP bilateral LS muscles.  FROM LS spine  Results for orders placed in visit on 08/17/13  POCT URINALYSIS DIPSTICK      Result Value Ref Range   Color, UA AMBER     Clarity, UA CLOUDY     Glucose, UA NEG     Bilirubin, UA NEG     Ketones, UA NEG     Spec Grav, UA >=1.030     Blood, UA NEG     pH, UA 5.0     Protein, UA NEG     Urobilinogen, UA negative     Nitrite, UA NEG     Leukocytes, UA moderate (2+)    POCT UA - MICROSCOPIC ONLY      Result Value Ref Range   WBC, Ur, HPF, POC 1-2     RBC, urine, microscopic NEG     Bacteria, U Microscopic NEG     Mucus, UA NEG     Epithelial cells, urine per micros OCC     Crystals, Ur, HPF, POC MANY     Casts, Ur, LPF, POC NEG     Yeast, UA NEG        LS spine xray - scoliosis KUB - stool right colon Prelimnary reading by Gwyndolyn Saxon [place],FNP Assessment & Plan:  Right-sided low back pain without sciatica - Plan: POCT urinalysis dipstick, POCT UA - Microscopic Only, DG Abd 1 View, DG Lumbar Spine 2-3 Views, naproxen (NAPROSYN) 500 MG tablet, cyclobenzaprine (FLEXERIL) 10 MG  tablet  Right lower quadrant abdominal pain - Plan: DG Abd 1 View, naproxen (NAPROSYN) 500 MG tablet, cyclobenzaprine (FLEXERIL) 10 MG tablet  UTI - Cipro 500mg  one po bid x 7 days #14  Lysbeth Penner FNP

## 2013-08-19 ENCOUNTER — Telehealth: Payer: Self-pay | Admitting: Family Medicine

## 2013-08-19 LAB — URINE CULTURE

## 2013-08-19 NOTE — Telephone Encounter (Signed)
Message copied by Waverly Ferrari on Wed Aug 19, 2013  9:06 AM ------      Message from: Lysbeth Penner      Created: Wed Aug 19, 2013  8:22 AM       UA cx normal and continue abx's ------

## 2013-08-19 NOTE — Telephone Encounter (Signed)
Pt aware of urine cx results

## 2014-01-12 ENCOUNTER — Other Ambulatory Visit: Payer: Self-pay | Admitting: Obstetrics & Gynecology

## 2014-01-14 LAB — CYTOLOGY - PAP

## 2014-03-03 ENCOUNTER — Ambulatory Visit (INDEPENDENT_AMBULATORY_CARE_PROVIDER_SITE_OTHER): Payer: 59 | Admitting: Nurse Practitioner

## 2014-03-03 ENCOUNTER — Encounter: Payer: Self-pay | Admitting: Nurse Practitioner

## 2014-03-03 VITALS — BP 141/87 | HR 100 | Temp 99.1°F | Ht 62.0 in | Wt 149.0 lb

## 2014-03-03 DIAGNOSIS — J0101 Acute recurrent maxillary sinusitis: Secondary | ICD-10-CM

## 2014-03-03 MED ORDER — AZITHROMYCIN 250 MG PO TABS: ORAL_TABLET | ORAL | Status: DC

## 2014-03-03 NOTE — Patient Instructions (Signed)

## 2014-03-03 NOTE — Progress Notes (Signed)
   Subjective:    Patient ID: Nichole Cunningham, female    DOB: [birth year], 64 y.o.   MRN: [id number]  HPI Patient in today c/o cough and congestion- Started 2 weeks ago as just a cough- Has gotten worse. Low grade fever.    Review of Systems  Constitutional: Positive for fever and fatigue. Negative for chills and appetite change.  HENT: Positive for congestion, postnasal drip, rhinorrhea, sinus pressure, sore throat and voice change. Negative for ear pain and trouble swallowing.   Respiratory: Positive for cough.   Cardiovascular: Negative.   Gastrointestinal: Negative.   Genitourinary: Negative.   Neurological: Negative.   Psychiatric/Behavioral: Negative.   All other systems reviewed and are negative.      Objective:   Physical Exam  Constitutional: She is oriented to person, place, and time. She appears well-developed and well-nourished. No distress.  HENT:  Right Ear: Hearing, tympanic membrane, external ear and ear canal normal.  Left Ear: Hearing, tympanic membrane, external ear and ear canal normal.  Nose: Mucosal edema and rhinorrhea present. Right sinus exhibits maxillary sinus tenderness. Right sinus exhibits no frontal sinus tenderness. Left sinus exhibits maxillary sinus tenderness. Left sinus exhibits no frontal sinus tenderness.  Mouth/Throat: Uvula is midline, oropharynx is clear and moist and mucous membranes are normal.  Eyes: Pupils are equal, round, and reactive to light.  Neck: Normal range of motion. Neck supple.  Cardiovascular: Normal rate, regular rhythm and normal heart sounds.   Pulmonary/Chest: Effort normal and breath sounds normal.  Dry cough  Abdominal: Soft. Bowel sounds are normal.  Lymphadenopathy:    She has no cervical adenopathy.  Neurological: She is alert and oriented to person, place, and time.  Skin: Skin is warm and dry.  Psychiatric: She has a normal mood and affect. Her behavior is normal. Judgment and thought content normal.   BP  141/87 mmHg  Pulse 100  Temp(Src) 99.1 F (37.3 C) (Oral)  Ht 5\' 2"  (1.575 m)  Wt 149 lb (67.586 kg)  BMI 27.25 kg/m2        Assessment & Plan:   1. Acute recurrent maxillary sinusitis    Meds ordered this encounter  Medications  . azithromycin (ZITHROMAX Z-PAK) 250 MG tablet    Sig: As directed    Dispense:  6 each    Refill:  0    Order Specific Question:  Supervising Provider    Answer:  Chipper Herb [[id number]]   1. Take meds as prescribed 2. Use a cool mist humidifier especially during the winter months and when heat has been humid. 3. Use saline nose sprays frequently 4. Saline irrigations of the nose can be very helpful if done frequently.  * 4X daily for 1 week*  * Use of a nettie pot can be helpful with this. Follow directions with this* 5. Drink plenty of fluids 6. Keep thermostat turn down low 7.For any cough or congestion  Use plain Mucinex- regular strength or max strength is fine   * Children- consult with Pharmacist for dosing 8. For fever or aces or pains- take tylenol or ibuprofen appropriate for age and weight.  * for fevers greater than 101 orally you may alternate ibuprofen and tylenol every  3 hours.   Mary-Margaret Hassell Done, FNP

## 2014-10-01 ENCOUNTER — Ambulatory Visit (INDEPENDENT_AMBULATORY_CARE_PROVIDER_SITE_OTHER): Payer: 59

## 2014-10-01 ENCOUNTER — Encounter: Payer: Self-pay | Admitting: Nurse Practitioner

## 2014-10-01 ENCOUNTER — Ambulatory Visit (INDEPENDENT_AMBULATORY_CARE_PROVIDER_SITE_OTHER): Payer: 59 | Admitting: Nurse Practitioner

## 2014-10-01 ENCOUNTER — Telehealth: Payer: Self-pay | Admitting: Nurse Practitioner

## 2014-10-01 VITALS — BP 128/82 | HR 81 | Temp 97.4°F | Ht 62.0 in | Wt 156.0 lb

## 2014-10-01 DIAGNOSIS — Z780 Asymptomatic menopausal state: Secondary | ICD-10-CM | POA: Diagnosis not present

## 2014-10-01 DIAGNOSIS — E785 Hyperlipidemia, unspecified: Secondary | ICD-10-CM | POA: Insufficient documentation

## 2014-10-01 DIAGNOSIS — Z6828 Body mass index (BMI) 28.0-28.9, adult: Secondary | ICD-10-CM | POA: Diagnosis not present

## 2014-10-01 DIAGNOSIS — G4700 Insomnia, unspecified: Secondary | ICD-10-CM

## 2014-10-01 DIAGNOSIS — Z6829 Body mass index (BMI) 29.0-29.9, adult: Secondary | ICD-10-CM | POA: Insufficient documentation

## 2014-10-01 MED ORDER — FENOFIBRATE MICRONIZED 130 MG PO CAPS: 130.0000 mg | ORAL_CAPSULE | Freq: Every day | ORAL | Status: DC

## 2014-10-01 MED ORDER — ZOLPIDEM TARTRATE 10 MG PO TABS: 5.0000 mg | ORAL_TABLET | Freq: Every evening | ORAL | Status: DC | PRN

## 2014-10-01 MED ORDER — ROSUVASTATIN CALCIUM 10 MG PO TABS: 10.0000 mg | ORAL_TABLET | Freq: Every day | ORAL | Status: DC

## 2014-10-01 NOTE — Progress Notes (Signed)
   Subjective:    Patient ID: Nichole Cunningham, female    DOB: 11-27-1950, 64 y.o.   MRN: [id number]   Patient here today for follow up of chronic medical problems.   Hyperlipidemia This is a chronic problem. Recent lipid tests were reviewed and are variable. She has no history of diabetes, hypothyroidism or obesity. Current antihyperlipidemic treatment includes statins. The current treatment provides moderate improvement of lipids. Compliance problems include adherence to diet and adherence to exercise.  Risk factors for coronary artery disease include dyslipidemia, post-menopausal and a sedentary lifestyle.  insomnia Uses ambien which helps her rest well at night. No side effects- has been out of meds for awhile      Review of Systems  Constitutional: Negative.   HENT: Negative.   Respiratory: Negative.   Cardiovascular: Negative.   Gastrointestinal: Negative.   Genitourinary: Negative.   Neurological: Negative.   Psychiatric/Behavioral: Negative.   All other systems reviewed and are negative.      Objective:   Physical Exam  Constitutional: She is oriented to person, place, and time. She appears well-developed and well-nourished.  HENT:  Nose: Nose normal.  Mouth/Throat: Oropharynx is clear and moist.  Eyes: EOM are normal.  Neck: Trachea normal, normal range of motion and full passive range of motion without pain. Neck supple. No JVD present. Carotid bruit is not present. No thyromegaly present.  Cardiovascular: Normal rate, regular rhythm, normal heart sounds and intact distal pulses.  Exam reveals no gallop and no friction rub.   No murmur heard. Pulmonary/Chest: Effort normal and breath sounds normal.  Abdominal: Soft. Bowel sounds are normal. She exhibits no distension and no mass. There is no tenderness.  Musculoskeletal: Normal range of motion.  Lymphadenopathy:    She has no cervical adenopathy.  Neurological: She is alert and oriented to person, place, and time.  She has normal reflexes.  Skin: Skin is warm and dry.  Psychiatric: She has a normal mood and affect. Her behavior is normal. Judgment and thought content normal.    BP 128/82 mmHg  Pulse 81  Temp(Src) 97.4 F (36.3 C) (Oral)  Ht $R'5\' 2"'Ce$  (1.575 m)  Wt 156 lb (70.761 kg)  BMI 28.53 kg/m2  EKG- sinus rhythym-.mmms  Chest x ray- no cardiopulmonary problems-Preliminary reading by Ronnald Collum, FNP  [facility]      Assessment & Plan:  1. Hyperlipidemia with target LDL less than 100 Low fat diet - CMP14+EGFR - Lipid panel - fenofibrate micronized (ANTARA) 130 MG capsule; Take 1 capsule (130 mg total) by mouth daily before breakfast.  Dispense: 90 capsule; Refill: 1 - rosuvastatin (CRESTOR) 10 MG tablet; Take 1 tablet (10 mg total) by mouth daily.  Dispense: 90 tablet; Refill: 1 - DG Chest 2 View; Future - EKG 12-Lead  2. Insomnia Bedtime ritual - zolpidem (AMBIEN) 10 MG tablet; Take 0.5 tablets (5 mg total) by mouth at bedtime as needed.  Dispense: 30 tablet; Refill: 2  3. BMI 28.0-28.9,adult Discussed diet and exercise for person with BMI >25 Will recheck weight in 3-6 months   4. Menopause Weight bearing exercises - DG Bone Density; Future    Labs pending Health maintenance reviewed Diet and exercise encouraged Continue all meds Follow up  In 6 months   [place], FNP

## 2014-10-01 NOTE — Patient Instructions (Signed)
Bone Health Our bones do many things. They provide structure, protect organs, anchor muscles, and store calcium. Adequate calcium in your diet and weight-bearing physical activity help build strong bones, improve bone amounts, and may reduce the risk of weakening of bones (osteoporosis) later in life. PEAK BONE MASS By age 64, the average woman has acquired most of her skeletal bone mass. A large decline occurs in older adults which increases the risk of osteoporosis. In women this occurs around the time of menopause. It is important for young girls to reach their peak bone mass in order to maintain bone health throughout life. A person with high bone mass as a young adult will be more likely to have a higher bone mass later in life. Not enough calcium consumption and physical activity early on could result in a failure to achieve optimum bone mass in adulthood. OSTEOPOROSIS Osteoporosis is a disease of the bones. It is defined as low bone mass with deterioration of bone structure. Osteoporosis leads to an increase risk of fractures with falls. These fractures commonly happen in the wrist, hip, and spine. While men and women of all ages and background can develop osteoporosis, some of the risk factors for osteoporosis are:  Female.  White.  Postmenopausal.  Older adults.  Small in body size.  Eating a diet low in calcium.  Physically inactive.  Smoking.  Use of some medications.  Family history. CALCIUM Calcium is a mineral needed by the body for healthy bones, teeth, and proper function of the heart, muscles, and nerves. The body cannot produce calcium so it must be absorbed through food. Good sources of calcium include:  Dairy products (low fat or nonfat milk, cheese, and yogurt).  Dark green leafy vegetables (bok choy and broccoli).  Calcium fortified foods (orange juice, cereal, bread, soy beverages, and tofu products).  Nuts (almonds). Recommended amounts of calcium vary  for individuals. RECOMMENDED CALCIUM INTAKES Age and Amount in mg per day  Children 1 to 3 years / 700 mg  Children 4 to 8 years / 1,000 mg  Children 9 to 13 years / 1,300 mg  Teens 14 to 18 years / 1,300 mg  Adults 19 to 50 years / 1,000 mg  Adult women 51 to 70 years / 1,200 mg  Adults 71 years and older / 1,200 mg  Pregnant and breastfeeding teens / 1,300 mg  Pregnant and breastfeeding adults / 1,000 mg Vitamin D also plays an important role in healthy bone development. Vitamin D helps in the absorption of calcium. WEIGHT-BEARING PHYSICAL ACTIVITY Regular physical activity has many positive health benefits. Benefits include strong bones. Weight-bearing physical activity early in life is important in reaching peak bone mass. Weight-bearing physical activities cause muscles and bones to work against gravity. Some examples of weight bearing physical activities include:  Walking, jogging, or running.  Field Hockey.  Jumping rope.  Dancing.  Soccer.  Tennis or Racquetball.  Stair climbing.  Basketball.  Hiking.  Weight lifting.  Aerobic fitness classes. Including weight-bearing physical activity into an exercise plan is a great way to keep bones healthy. Adults: Engage in at least 30 minutes of moderate physical activity on most, preferably all, days of the week. Children: Engage in at least 60 minutes of moderate physical activity on most, preferably all, days of the week. FOR MORE INFORMATION [place] Department of Agriculture, Center for Nutrition Policy and Promotion: www.cnpp.usda.gov National Osteoporosis Foundation: www.nof.org Document Released: 05/05/2003 Document Revised: 06/09/2012 Document Reviewed: 08/04/2008 Exit[employer] Patient Information   2015 [employer]. This information is not intended to replace advice given to you by your health care provider. Make sure you discuss any questions you have with your health care provider.  

## 2014-10-02 ENCOUNTER — Other Ambulatory Visit: Payer: Self-pay | Admitting: Nurse Practitioner

## 2014-10-02 LAB — CMP14+EGFR
ALBUMIN: 4.6 g/dL (ref 3.6–4.8)
ALT: 31 IU/L (ref 0–32)
AST: 26 IU/L (ref 0–40)
Albumin/Globulin Ratio: 2 (ref 1.1–2.5)
Alkaline Phosphatase: 87 IU/L (ref 39–117)
BUN / CREAT RATIO: 22 (ref 11–26)
BUN: 18 mg/dL (ref 8–27)
Bilirubin Total: 2.4 mg/dL — ABNORMAL HIGH (ref 0.0–1.2)
CHLORIDE: 99 mmol/L (ref 97–108)
CO2: 26 mmol/L (ref 18–29)
Calcium: 9.8 mg/dL (ref 8.7–10.3)
Creatinine, Ser: 0.81 mg/dL (ref 0.57–1.00)
GFR calc Af Amer: 89 mL/min/{1.73_m2} (ref >59)
GFR, EST NON AFRICAN AMERICAN: 78 mL/min/{1.73_m2} (ref >59)
GLOBULIN, TOTAL: 2.3 g/dL (ref 1.5–4.5)
GLUCOSE: 94 mg/dL (ref 65–99)
Potassium: 4.7 mmol/L (ref 3.5–5.2)
Sodium: 139 mmol/L (ref 134–144)
TOTAL PROTEIN: 6.9 g/dL (ref 6.0–8.5)

## 2014-10-02 LAB — LIPID PANEL
CHOL/HDL RATIO: 7.3 ratio — AB (ref 0.0–4.4)
Cholesterol, Total: 292 mg/dL — ABNORMAL HIGH (ref 100–199)
HDL: 40 mg/dL (ref >39)
TRIGLYCERIDES: 575 mg/dL — AB (ref 0–149)

## 2014-10-02 MED ORDER — FENOFIBRATE 145 MG PO TABS: 145.0000 mg | ORAL_TABLET | Freq: Every day | ORAL | Status: DC

## 2014-10-02 MED ORDER — ATORVASTATIN CALCIUM 40 MG PO TABS: 40.0000 mg | ORAL_TABLET | Freq: Every day | ORAL | Status: DC

## 2014-10-02 NOTE — Telephone Encounter (Signed)
Changed to fenofibrate and lipitor

## 2014-10-02 NOTE — Telephone Encounter (Signed)
Patient informed. 

## 2014-10-05 ENCOUNTER — Telehealth: Payer: Self-pay

## 2014-10-06 NOTE — Telephone Encounter (Signed)
x

## 2014-10-08 ENCOUNTER — Telehealth: Payer: Self-pay

## 2014-10-08 MED ORDER — FENOFIBRATE 160 MG PO TABS: 160.0000 mg | ORAL_TABLET | Freq: Every day | ORAL | Status: DC

## 2014-10-08 NOTE — Telephone Encounter (Signed)
Insurance denied prior authorization for Fenofibrate  Needs to fail Fenofibrate 54 mg and, Fenofibrate 160 mg

## 2014-10-11 ENCOUNTER — Telehealth: Payer: Self-pay | Admitting: Nurse Practitioner

## 2014-10-11 NOTE — Telephone Encounter (Signed)
Patient states that rx for fenofibrate is to high and would like to see if there is anything else she can take. Message sent to [facility].

## 2014-10-12 MED ORDER — FENOFIBRATE 145 MG PO TABS: 145.0000 mg | ORAL_TABLET | Freq: Every day | ORAL | Status: DC

## 2014-10-12 NOTE — Telephone Encounter (Signed)
Sent in tricor rx and see if cheaper

## 2014-10-12 NOTE — Telephone Encounter (Signed)
Patient aware that new rx has been sent to the pharmacy.

## 2014-11-17 ENCOUNTER — Telehealth: Payer: Self-pay | Admitting: Nurse Practitioner

## 2014-11-17 DIAGNOSIS — E785 Hyperlipidemia, unspecified: Secondary | ICD-10-CM

## 2014-11-17 MED ORDER — ROSUVASTATIN CALCIUM 10 MG PO TABS: 10.0000 mg | ORAL_TABLET | Freq: Every day | ORAL | Status: DC

## 2014-11-17 NOTE — Addendum Note (Signed)
Addended by: Eustaquio Maize on: 11/17/2014 01:28 PM   Modules accepted: Orders

## 2014-11-17 NOTE — Telephone Encounter (Signed)
Pt has been on Lipitor for about 2.5 mos now. She takes it at night and develops a headache. She has taken Crestor in the past without any problems and is wondering if she could switch back to it. She is leaving in the am to go out of town.

## 2014-11-17 NOTE — Telephone Encounter (Signed)
Starts getting a headache after taking the lipitor. Thinks she tolerated the crestor better. Will switch to crestor for 30 days to see if symptoms improve. Pt coming by to pick up prescription and coupon.

## 2015-01-24 ENCOUNTER — Telehealth: Payer: Self-pay | Admitting: Nurse Practitioner

## 2015-05-17 ENCOUNTER — Other Ambulatory Visit: Payer: Self-pay | Admitting: Obstetrics & Gynecology

## 2015-05-18 LAB — CYTOLOGY - PAP

## 2015-05-25 ENCOUNTER — Encounter: Payer: Self-pay | Admitting: Nurse Practitioner

## 2015-05-25 ENCOUNTER — Ambulatory Visit (INDEPENDENT_AMBULATORY_CARE_PROVIDER_SITE_OTHER): Payer: BLUE CROSS/BLUE SHIELD | Admitting: Nurse Practitioner

## 2015-05-25 VITALS — BP 138/82 | HR 74 | Temp 96.7°F | Ht 62.0 in | Wt 160.0 lb

## 2015-05-25 DIAGNOSIS — E785 Hyperlipidemia, unspecified: Secondary | ICD-10-CM

## 2015-05-25 DIAGNOSIS — Z1212 Encounter for screening for malignant neoplasm of rectum: Secondary | ICD-10-CM | POA: Diagnosis not present

## 2015-05-25 DIAGNOSIS — Z6828 Body mass index (BMI) 28.0-28.9, adult: Secondary | ICD-10-CM | POA: Diagnosis not present

## 2015-05-25 DIAGNOSIS — G4700 Insomnia, unspecified: Secondary | ICD-10-CM | POA: Diagnosis not present

## 2015-05-25 DIAGNOSIS — Z1159 Encounter for screening for other viral diseases: Secondary | ICD-10-CM

## 2015-05-25 MED ORDER — ROSUVASTATIN CALCIUM 10 MG PO TABS: 10.0000 mg | ORAL_TABLET | Freq: Every day | ORAL | Status: DC

## 2015-05-25 MED ORDER — FENOFIBRATE 160 MG PO TABS: 160.0000 mg | ORAL_TABLET | Freq: Every day | ORAL | Status: DC

## 2015-05-25 NOTE — Patient Instructions (Signed)
Fat and Cholesterol Restricted Diet High levels of fat and cholesterol in your blood may lead to various health problems, such as diseases of the heart, blood vessels, gallbladder, liver, and pancreas. Fats are concentrated sources of energy that come in various forms. Certain types of fat, including saturated fat, may be harmful in excess. Cholesterol is a substance needed by your body in small amounts. Your body makes all the cholesterol it needs. Excess cholesterol comes from the food you eat. When you have high levels of cholesterol and saturated fat in your blood, health problems can develop because the excess fat and cholesterol will gather along the walls of your blood vessels, causing them to narrow. Choosing the right foods will help you control your intake of fat and cholesterol. This will help keep the levels of these substances in your blood within normal limits and reduce your risk of disease. WHAT IS MY PLAN? Your health care provider recommends that you:  Get no more than ___20_______ % of the total calories in your daily diet from fat.  Limit your intake of saturated fat to less than __10____% of your total calories each day.  Limit the amount of cholesterol in your diet to less than ______80___mg per day. WHAT TYPES OF FAT SHOULD I CHOOSE?  Choose healthy fats more often. Choose monounsaturated and polyunsaturated fats, such as olive and canola oil, flaxseeds, walnuts, almonds, and seeds.  Eat more omega-3 fats. Good choices include salmon, mackerel, sardines, tuna, flaxseed oil, and ground flaxseeds. Aim to eat fish at least two times a week.  Limit saturated fats. Saturated fats are primarily found in animal products, such as meats, butter, and cream. Plant sources of saturated fats include palm oil, palm kernel oil, and coconut oil.  Avoid foods with partially hydrogenated oils in them. These contain trans fats. Examples of foods that contain trans fats are stick margarine,  some tub margarines, cookies, crackers, and other baked goods. WHAT GENERAL GUIDELINES DO I NEED TO FOLLOW? These guidelines for healthy eating will help you control your intake of fat and cholesterol:  Check food labels carefully to identify foods with trans fats or high amounts of saturated fat.  Fill one half of your plate with vegetables and green salads.  Fill one fourth of your plate with whole grains. Look for the word "whole" as the first word in the ingredient list.  Fill one fourth of your plate with lean protein foods.  Limit fruit to two servings a day. Choose fruit instead of juice.  Eat more foods that contain soluble fiber. Examples of foods that contain this type of fiber are apples, broccoli, carrots, beans, peas, and barley. Aim to get 20-30 g of fiber per day.  Eat more home-cooked food and less restaurant, buffet, and fast food.  Limit or avoid alcohol.  Limit foods high in starch and sugar.  Limit fried foods.  Cook foods using methods other than frying. Baking, boiling, grilling, and broiling are all great options.  Lose weight if you are overweight. Losing just 5-10% of your initial body weight can help your overall health and prevent diseases such as diabetes and heart disease. WHAT FOODS CAN I EAT? Grains Whole grains, such as whole wheat or whole grain breads, crackers, cereals, and pasta. Unsweetened oatmeal, bulgur, barley, quinoa, or brown rice. Corn or whole wheat flour tortillas. Vegetables Fresh or frozen vegetables (raw, steamed, roasted, or grilled). Green salads. Fruits All fresh, canned (in natural juice), or frozen fruits. Meat and  Other Protein Products Ground beef (85% or leaner), grass-fed beef, or beef trimmed of fat. Skinless chicken or turkey. Ground chicken or turkey. Pork trimmed of fat. All fish and seafood. Eggs. Dried beans, peas, or lentils. Unsalted nuts or seeds. Unsalted canned or dry beans. Dairy Low-fat dairy products, such as  skim or 1% milk, 2% or reduced-fat cheeses, low-fat ricotta or cottage cheese, or plain low-fat yogurt. Fats and Oils Tub margarines without trans fats. Light or reduced-fat mayonnaise and salad dressings. Avocado. Olive, canola, sesame, or safflower oils. Natural peanut or almond butter (choose ones without added sugar and oil). The items listed above may not be a complete list of recommended foods or beverages. Contact your dietitian for more options. WHAT FOODS ARE NOT RECOMMENDED? Grains White bread. White pasta. White rice. Cornbread. Bagels, pastries, and croissants. Crackers that contain trans fat. Vegetables White potatoes. Corn. Creamed or fried vegetables. Vegetables in a cheese sauce. Fruits Dried fruits. Canned fruit in light or heavy syrup. Fruit juice. Meat and Other Protein Products Fatty cuts of meat. Ribs, chicken wings, bacon, sausage, bologna, salami, chitterlings, fatback, hot dogs, bratwurst, and packaged luncheon meats. Liver and organ meats. Dairy Whole or 2% milk, cream, half-and-half, and cream cheese. Whole milk cheeses. Whole-fat or sweetened yogurt. Full-fat cheeses. Nondairy creamers and whipped toppings. Processed cheese, cheese spreads, or cheese curds. Sweets and Desserts Corn syrup, sugars, honey, and molasses. Candy. Jam and jelly. Syrup. Sweetened cereals. Cookies, pies, cakes, donuts, muffins, and ice cream. Fats and Oils Butter, stick margarine, lard, shortening, ghee, or bacon fat. Coconut, palm kernel, or palm oils. Beverages Alcohol. Sweetened drinks (such as sodas, lemonade, and fruit drinks or punches). The items listed above may not be a complete list of foods and beverages to avoid. Contact your dietitian for more information.   This information is not intended to replace advice given to you by your health care provider. Make sure you discuss any questions you have with your health care provider.   Document Released: 02/12/2005 Document Revised:  03/05/2014 Document Reviewed: 05/13/2013 Elsevier Interactive Patient Education 2016 Else[employer] Inc.  

## 2015-05-25 NOTE — Progress Notes (Signed)
Subjective:    Patient ID: Nichole Cunningham, female    DOB: 11-07-1950, 65 y.o.   MRN: [id number]   Patient here today for follow up of chronic medical problems.  Outpatient Encounter Prescriptions as of 05/25/2015  Medication Sig  . Cholecalciferol (VITAMIN D3 SUPER STRENGTH) 2000 UNITS TABS Take 1 tablet by mouth daily.  . Omega-3 Fatty Acids (OMEGA 3 PO) Take by mouth.  . Red Yeast Rice Extract (RED YEAST RICE PO) Take by mouth.  . zolpidem (AMBIEN) 10 MG tablet Take 0.5 tablets (5 mg total) by mouth at bedtime as needed.           * Gyn actually started her on Maxzide 37.5-25 daily- she only took 2 tablets and it causes lower ext cramping so she stopped taking. She has been checking blood pressure at home and is running in the [id number] systolic. * stopped fenofibrate due to expense- we also changed her to liptor due to expense and she said that caused headache- changed her back to crestor but she has not been taking. Labs done last week at OB/GYN- trig were 479- ldl would not calculate due to Trig being so elevated and HDL were normal.   Hyperlipidemia This is a chronic problem. Recent lipid tests were reviewed and are variable. She has no history of diabetes, hypothyroidism or obesity. Pertinent negatives include no shortness of breath. Current antihyperlipidemic treatment includes statins. The current treatment provides moderate improvement of lipids. Compliance problems include adherence to diet and adherence to exercise.  Risk factors for coronary artery disease include dyslipidemia, post-menopausal and a sedentary lifestyle.  Hypertension This is a new problem. The current episode started more than 1 month ago. The problem is unchanged. The problem is controlled. Pertinent negatives include no blurred vision, headaches or shortness of breath. Risk factors for coronary artery disease include dyslipidemia, obesity and post-menopausal state. Past treatments include diuretics. The current  treatment provides moderate improvement. Compliance problems include diet and exercise.  There is no history of CAD/MI or CVA.  insomnia Uses ambien which helps her rest well at night. No side effects- has been out of meds for awhile      Review of Systems  Constitutional: Negative.   HENT: Negative.   Eyes: Negative for blurred vision.  Respiratory: Negative.  Negative for shortness of breath.   Cardiovascular: Negative.   Gastrointestinal: Negative.   Genitourinary: Negative.   Neurological: Negative.  Negative for headaches.  Psychiatric/Behavioral: Negative.   All other systems reviewed and are negative.      Objective:   Physical Exam  Constitutional: She is oriented to person, place, and time. She appears well-developed and well-nourished.  HENT:  Nose: Nose normal.  Mouth/Throat: Oropharynx is clear and moist.  Eyes: EOM are normal.  Neck: Trachea normal, normal range of motion and full passive range of motion without pain. Neck supple. No JVD present. Carotid bruit is not present. No thyromegaly present.  Cardiovascular: Normal rate, regular rhythm, normal heart sounds and intact distal pulses.  Exam reveals no gallop and no friction rub.   No murmur heard. Pulmonary/Chest: Effort normal and breath sounds normal.  Abdominal: Soft. Bowel sounds are normal. She exhibits no distension and no mass. There is no tenderness.  Musculoskeletal: Normal range of motion.  Lymphadenopathy:    She has no cervical adenopathy.  Neurological: She is alert and oriented to person, place, and time. She has normal reflexes.  Skin: Skin is warm and dry.  Psychiatric: She has  a normal mood and affect. Her behavior is normal. Judgment and thought content normal.    BP 138/82 mmHg  Pulse 74  Temp(Src) 96.7 F (35.9 C) (Oral)  Ht 5\' 2"  (1.575 m)  Wt 160 lb (72.576 kg)  BMI 29.26 kg/m2       Assessment & Plan:  1. Hyperlipidemia Low fat diet Discussed need to get back on  fenofibrate and crestor - fenofibrate 160 MG tablet; Take 1 tablet (160 mg total) by mouth daily.  Dispense: 30 tablet; Refill: 5 - LDL Cholesterol, Direct - rosuvastatin (CRESTOR) 10 MG tablet; Take 1 tablet (10 mg total) by mouth daily.  Dispense: 30 tablet; Refill: 5  2. Hypertension Keep diary of blood pressure at home Do not add salt  To diet  3. Insomnia Bedtime ritual  4. BMI 28.0-28.9,adult Discussed diet and exercise for person with BMI >25 Will recheck weight in 3-6 months  5. Need for hepatitis C screening test - Hepatitis C antibody  6. Screening for malignant neoplasm of the rectum - Fecal occult blood, imunochemical; Future    Labs pending Health maintenance reviewed Diet and exercise encouraged Continue all meds Follow up  In 3 month   [place], FNP

## 2015-05-26 LAB — HEPATITIS C ANTIBODY: HEP C VIRUS AB: 0.2 {s_co_ratio} (ref 0.0–0.9)

## 2015-05-26 LAB — LDL CHOLESTEROL, DIRECT: LDL DIRECT: 153 mg/dL — AB (ref 0–99)

## 2015-07-07 ENCOUNTER — Other Ambulatory Visit: Payer: Self-pay

## 2015-07-07 DIAGNOSIS — E785 Hyperlipidemia, unspecified: Secondary | ICD-10-CM

## 2015-07-07 MED ORDER — ROSUVASTATIN CALCIUM 10 MG PO TABS: 10.0000 mg | ORAL_TABLET | Freq: Every day | ORAL | Status: DC

## 2015-07-07 MED ORDER — FENOFIBRATE 160 MG PO TABS: 160.0000 mg | ORAL_TABLET | Freq: Every day | ORAL | Status: DC

## 2015-09-14 ENCOUNTER — Ambulatory Visit: Payer: BLUE CROSS/BLUE SHIELD | Admitting: Nurse Practitioner

## 2015-09-23 ENCOUNTER — Ambulatory Visit (INDEPENDENT_AMBULATORY_CARE_PROVIDER_SITE_OTHER): Payer: BLUE CROSS/BLUE SHIELD | Admitting: Nurse Practitioner

## 2015-09-23 ENCOUNTER — Encounter: Payer: Self-pay | Admitting: Nurse Practitioner

## 2015-09-23 VITALS — BP 126/78 | HR 70 | Temp 97.1°F | Ht 62.0 in | Wt 158.0 lb

## 2015-09-23 DIAGNOSIS — E785 Hyperlipidemia, unspecified: Secondary | ICD-10-CM | POA: Diagnosis not present

## 2015-09-23 DIAGNOSIS — E875 Hyperkalemia: Secondary | ICD-10-CM

## 2015-09-23 DIAGNOSIS — G4700 Insomnia, unspecified: Secondary | ICD-10-CM | POA: Diagnosis not present

## 2015-09-23 DIAGNOSIS — Z6828 Body mass index (BMI) 28.0-28.9, adult: Secondary | ICD-10-CM

## 2015-09-23 MED ORDER — FENOFIBRATE 160 MG PO TABS
160.0000 mg | ORAL_TABLET | Freq: Every day | ORAL | 1 refills | Status: DC
Start: 2015-09-23 — End: 2016-03-20

## 2015-09-23 MED ORDER — ZOLPIDEM TARTRATE 10 MG PO TABS: 5.0000 mg | ORAL_TABLET | Freq: Every evening | ORAL | 2 refills | Status: DC | PRN

## 2015-09-23 MED ORDER — ROSUVASTATIN CALCIUM 10 MG PO TABS: 10.0000 mg | ORAL_TABLET | Freq: Every day | ORAL | 1 refills | Status: DC

## 2015-09-23 NOTE — Progress Notes (Signed)
Subjective:    Patient ID: Nichole Cunningham, female    DOB: 1950/12/30, 65 y.o.   MRN: [id number]   Patient here today for follow up of chronic medical problems. Patient has no complaints today.  Outpatient Encounter Prescriptions as of 09/23/2015  Medication Sig  . Cholecalciferol (VITAMIN D3 SUPER STRENGTH) 2000 UNITS TABS Take 1 tablet by mouth daily.  . fenofibrate 160 MG tablet Take 1 tablet (160 mg total) by mouth daily.  . Omega-3 Fatty Acids (OMEGA 3 PO) Take by mouth.  . Red Yeast Rice Extract (RED YEAST RICE PO) Take by mouth.  . rosuvastatin (CRESTOR) 10 MG tablet Take 1 tablet (10 mg total) by mouth daily.  Marland Kitchen zolpidem (AMBIEN) 10 MG tablet Take 0.5 tablets (5 mg total) by mouth at bedtime as needed.   No facility-administered encounter medications on file as of 09/23/2015.       Hyperlipidemia  This is a chronic problem. Recent lipid tests were reviewed and are variable. She has no history of diabetes, hypothyroidism or obesity. Current antihyperlipidemic treatment includes statins and fibric acid derivatives. The current treatment provides moderate improvement of lipids. Compliance problems include adherence to diet and adherence to exercise.  Risk factors for coronary artery disease include dyslipidemia, post-menopausal and a sedentary lifestyle.  insomnia Uses ambien which helps her rest well at night. No side effects- has been out of meds for awhile   Review of Systems  Constitutional: Negative.   HENT: Negative.   Respiratory: Negative.   Cardiovascular: Negative.   Gastrointestinal: Negative.   Genitourinary: Negative.   Neurological: Negative.   Psychiatric/Behavioral: Negative.   All other systems reviewed and are negative.      Objective:   Physical Exam  Constitutional: She is oriented to person, place, and time. She appears well-developed and well-nourished.  HENT:  Nose: Nose normal.  Mouth/Throat: Oropharynx is clear and moist.  Eyes: EOM are  normal.  Neck: Trachea normal, normal range of motion and full passive range of motion without pain. Neck supple. No JVD present. Carotid bruit is not present. No thyromegaly present.  Cardiovascular: Normal rate, regular rhythm, normal heart sounds and intact distal pulses.  Exam reveals no gallop and no friction rub.   No murmur heard. Pulmonary/Chest: Effort normal and breath sounds normal.  Abdominal: Soft. Bowel sounds are normal. She exhibits no distension and no mass. There is no tenderness.  Musculoskeletal: Normal range of motion.  Lymphadenopathy:    She has no cervical adenopathy.  Neurological: She is alert and oriented to person, place, and time. She has normal reflexes.  Skin: Skin is warm and dry.  Psychiatric: She has a normal mood and affect. Her behavior is normal. Judgment and thought content normal.    BP 126/78   Pulse 70   Temp 97.1 F (36.2 C) (Oral)   Ht '5\' 2"'$  (1.575 m)   Wt 158 lb (71.7 kg)   BMI 28.90 kg/m        Assessment & Plan:  1. Hyperlipidemia with target LDL less than 100 Low fat diet - rosuvastatin (CRESTOR) 10 MG tablet; Take 1 tablet (10 mg total) by mouth daily.  Dispense: 90 tablet; Refill: 1 - fenofibrate 160 MG tablet; Take 1 tablet (160 mg total) by mouth daily.  Dispense: 90 tablet; Refill: 1 - CMP14+EGFR - Lipid panel  2. Insomnia Bedtime routine - zolpidem (AMBIEN) 10 MG tablet; Take 0.5 tablets (5 mg total) by mouth at bedtime as needed.  Dispense: 30 tablet;  Refill: 2  3. BMI 28.0-28.9,adult Discussed diet and exercise for person with BMI >25 Will recheck weight in 3-6 months     Encouraged to do hemoccult cards given at lst appointment Labs pending Health maintenance reviewed Diet and exercise encouraged Continue all meds Follow up  In 3 months   [place], FNP

## 2015-09-23 NOTE — Patient Instructions (Signed)

## 2015-09-24 LAB — CMP14+EGFR
ALBUMIN: 4.9 g/dL — AB (ref 3.6–4.8)
ALT: 32 IU/L (ref 0–32)
AST: 33 IU/L (ref 0–40)
Albumin/Globulin Ratio: 2.2 (ref 1.2–2.2)
Alkaline Phosphatase: 64 IU/L (ref 39–117)
BUN / CREAT RATIO: 24 (ref 12–28)
BUN: 22 mg/dL (ref 8–27)
Bilirubin Total: 0.8 mg/dL (ref 0.0–1.2)
CALCIUM: 9.7 mg/dL (ref 8.7–10.3)
CO2: 23 mmol/L (ref 18–29)
CREATININE: 0.93 mg/dL (ref 0.57–1.00)
Chloride: 103 mmol/L (ref 96–106)
GFR, EST AFRICAN AMERICAN: 75 mL/min/{1.73_m2} (ref >59)
GFR, EST NON AFRICAN AMERICAN: 65 mL/min/{1.73_m2} (ref >59)
GLOBULIN, TOTAL: 2.2 g/dL (ref 1.5–4.5)
Glucose: 101 mg/dL — ABNORMAL HIGH (ref 65–99)
Potassium: 5.5 mmol/L — ABNORMAL HIGH (ref 3.5–5.2)
SODIUM: 143 mmol/L (ref 134–144)
TOTAL PROTEIN: 7.1 g/dL (ref 6.0–8.5)

## 2015-09-24 LAB — LIPID PANEL
CHOL/HDL RATIO: 3.5 ratio (ref 0.0–4.4)
Cholesterol, Total: 165 mg/dL (ref 100–199)
HDL: 47 mg/dL (ref >39)
LDL CALC: 81 mg/dL (ref 0–99)
TRIGLYCERIDES: 186 mg/dL — AB (ref 0–149)
VLDL Cholesterol Cal: 37 mg/dL (ref 5–40)

## 2015-09-28 ENCOUNTER — Other Ambulatory Visit: Payer: BLUE CROSS/BLUE SHIELD

## 2015-09-28 DIAGNOSIS — E875 Hyperkalemia: Secondary | ICD-10-CM

## 2015-09-28 NOTE — Addendum Note (Signed)
Addended by: Thana Ates on: 09/28/2015 10:58 AM   Modules accepted: Orders

## 2015-09-29 LAB — BMP8+EGFR
BUN/Creatinine Ratio: 32 — ABNORMAL HIGH (ref 12–28)
BUN: 30 mg/dL — ABNORMAL HIGH (ref 8–27)
CO2: 24 mmol/L (ref 18–29)
CREATININE: 0.93 mg/dL (ref 0.57–1.00)
Calcium: 10.2 mg/dL (ref 8.7–10.3)
Chloride: 99 mmol/L (ref 96–106)
GFR calc Af Amer: 75 mL/min/{1.73_m2} (ref >59)
GFR, EST NON AFRICAN AMERICAN: 65 mL/min/{1.73_m2} (ref >59)
Glucose: 85 mg/dL (ref 65–99)
Potassium: 5.2 mmol/L (ref 3.5–5.2)
Sodium: 139 mmol/L (ref 134–144)

## 2015-10-03 NOTE — Progress Notes (Signed)
Patient aware.

## 2016-01-02 ENCOUNTER — Other Ambulatory Visit: Payer: Medicare HMO

## 2016-01-02 DIAGNOSIS — Z1212 Encounter for screening for malignant neoplasm of rectum: Secondary | ICD-10-CM | POA: Diagnosis not present

## 2016-01-03 LAB — FECAL OCCULT BLOOD, IMMUNOCHEMICAL: FECAL OCCULT BLD: NEGATIVE

## 2016-02-16 DIAGNOSIS — R69 Illness, unspecified: Secondary | ICD-10-CM | POA: Diagnosis not present

## 2016-03-03 DIAGNOSIS — E782 Mixed hyperlipidemia: Secondary | ICD-10-CM | POA: Diagnosis not present

## 2016-03-03 DIAGNOSIS — R69 Illness, unspecified: Secondary | ICD-10-CM | POA: Diagnosis not present

## 2016-03-03 DIAGNOSIS — Z79899 Other long term (current) drug therapy: Secondary | ICD-10-CM | POA: Diagnosis not present

## 2016-03-03 DIAGNOSIS — Z0000 Encounter for general adult medical examination without abnormal findings: Secondary | ICD-10-CM | POA: Diagnosis not present

## 2016-03-03 DIAGNOSIS — Z6828 Body mass index (BMI) 28.0-28.9, adult: Secondary | ICD-10-CM | POA: Diagnosis not present

## 2016-03-16 ENCOUNTER — Ambulatory Visit: Payer: Medicare HMO | Admitting: Nurse Practitioner

## 2016-03-20 ENCOUNTER — Telehealth: Payer: Self-pay | Admitting: Nurse Practitioner

## 2016-03-20 ENCOUNTER — Ambulatory Visit (INDEPENDENT_AMBULATORY_CARE_PROVIDER_SITE_OTHER): Payer: Medicare HMO | Admitting: Nurse Practitioner

## 2016-03-20 ENCOUNTER — Encounter: Payer: Self-pay | Admitting: Nurse Practitioner

## 2016-03-20 VITALS — BP 118/80 | HR 81 | Temp 98.0°F | Ht 62.0 in | Wt 160.6 lb

## 2016-03-20 DIAGNOSIS — F5101 Primary insomnia: Secondary | ICD-10-CM | POA: Diagnosis not present

## 2016-03-20 DIAGNOSIS — E785 Hyperlipidemia, unspecified: Secondary | ICD-10-CM

## 2016-03-20 DIAGNOSIS — Z6829 Body mass index (BMI) 29.0-29.9, adult: Secondary | ICD-10-CM | POA: Diagnosis not present

## 2016-03-20 DIAGNOSIS — R69 Illness, unspecified: Secondary | ICD-10-CM | POA: Diagnosis not present

## 2016-03-20 MED ORDER — ZOLPIDEM TARTRATE 10 MG PO TABS: 5.0000 mg | ORAL_TABLET | Freq: Every evening | ORAL | 2 refills | Status: DC | PRN

## 2016-03-20 MED ORDER — FENOFIBRATE 160 MG PO TABS: 160.0000 mg | ORAL_TABLET | Freq: Every day | ORAL | 1 refills | Status: DC

## 2016-03-20 MED ORDER — ROSUVASTATIN CALCIUM 10 MG PO TABS: 10.0000 mg | ORAL_TABLET | Freq: Every day | ORAL | 1 refills | Status: DC

## 2016-03-20 NOTE — Patient Instructions (Signed)
Bone Health Introduction Bones protect organs, store calcium, and anchor muscles. Good health habits, such as eating nutritious foods and exercising regularly, are important for maintaining healthy bones. They can also help to prevent a condition that causes bones to lose density and become weak and brittle (osteoporosis). Why is bone mass important? Bone mass refers to the amount of bone tissue that you have. The higher your bone mass, the stronger your bones. An important step toward having healthy bones throughout life is to have strong and dense bones during childhood. A young adult who has a high bone mass is more likely to have a high bone mass later in life. Bone mass at its greatest it is called peak bone mass. A large decline in bone mass occurs in older adults. In women, it occurs about the time of menopause. During this time, it is important to practice good health habits, because if more bone is lost than what is replaced, the bones will become less healthy and more likely to break (fracture). If you find that you have a low bone mass, you may be able to prevent osteoporosis or further bone loss by changing your diet and lifestyle. How can I find out if my bone mass is low? Bone mass can be measured with an X-ray test that is called a bone mineral density (BMD) test. This test is recommended for all women who are age 65 or older. It may also be recommended for men who are age 70 or older, or for people who are more likely to develop osteoporosis due to:  Having bones that break easily.  Having a long-term disease that weakens bones, such as kidney disease or rheumatoid arthritis.  Having menopause earlier than normal.  Taking medicine that weakens bones, such as steroids, thyroid hormones, or hormone treatment for breast cancer or prostate cancer.  Smoking.  Drinking three or more alcoholic drinks each day. What are the nutritional recommendations for healthy bones? To have healthy  bones, you need to get enough of the right minerals and vitamins. Most nutrition experts recommend getting these nutrients from the foods that you eat. Nutritional recommendations vary from person to person. Ask your health care provider what is healthy for you. Here are some general guidelines. Calcium Recommendations  Calcium is the most important (essential) mineral for bone health. Most people can get enough calcium from their diet, but supplements may be recommended for people who are at risk for osteoporosis. Good sources of calcium include:  Dairy products, such as low-fat or nonfat milk, cheese, and yogurt.  Dark green leafy vegetables, such as bok choy and broccoli.  Calcium-fortified foods, such as orange juice, cereal, bread, soy beverages, and tofu products.  Nuts, such as almonds. Follow these recommended amounts for daily calcium intake:  Children, age 1?3: 700 mg.  Children, age 4?8: 1,000 mg.  Children, age 9?13: 1,300 mg.  Teens, age 14?18: 1,300 mg.  Adults, age 19?50: 1,000 mg.  Adults, age 51?70:  Men: 1,000 mg.  Women: 1,200 mg.  Adults, age 71 or older: 1,200 mg.  Pregnant and breastfeeding females:  Teens: 1,300 mg.  Adults: 1,000 mg. Vitamin D Recommendations  Vitamin D is the most essential vitamin for bone health. It helps the body to absorb calcium. Sunlight stimulates the skin to make vitamin D, so be sure to get enough sunlight. If you live in a cold climate or you do not get outside often, your health care provider may recommend that you take vitamin   D supplements. Good sources of vitamin D in your diet include:  Egg yolks.  Saltwater fish.  Milk and cereal fortified with vitamin D. Follow these recommended amounts for daily vitamin D intake:  Children and teens, age 1?18: 600 international units.  Adults, age 50 or younger: 400-800 international units.  Adults, age 51 or older: 800-1,000 international units. Other Nutrients  Other  nutrients for bone health include:  Phosphorus. This mineral is found in meat, poultry, dairy foods, nuts, and legumes. The recommended daily intake for adult men and adult women is 700 mg.  Magnesium. This mineral is found in seeds, nuts, dark green vegetables, and legumes. The recommended daily intake for adult men is 400?420 mg. For adult women, it is 310?320 mg.  Vitamin K. This vitamin is found in green leafy vegetables. The recommended daily intake is 120 mg for adult men and 90 mg for adult women. What type of physical activity is best for building and maintaining healthy bones? Weight-bearing and strength-building activities are important for building and maintaining peak bone mass. Weight-bearing activities cause muscles and bones to work against gravity. Strength-building activities increases muscle strength that supports bones. Weight-bearing and muscle-building activities include:  Walking and hiking.  Jogging and running.  Dancing.  Gym exercises.  Lifting weights.  Tennis and racquetball.  Climbing stairs.  Aerobics. Adults should get at least 30 minutes of moderate physical activity on most days. Children should get at least 60 minutes of moderate physical activity on most days. Ask your health care provide what type of exercise is best for you. Where can I find more information? For more information, check out the following websites:  National Osteoporosis Foundation: http://nof.org/learn/basics  National Institutes of Health: http://www.niams.nih.gov/Health_Info/Bone/Bone_Health/bone_health_for_life.asp This information is not intended to replace advice given to you by your health care provider. Make sure you discuss any questions you have with your health care provider. Document Released: 05/05/2003 Document Revised: 09/02/2015 Document Reviewed: 02/17/2014  2017 Elsevier  

## 2016-03-20 NOTE — Telephone Encounter (Signed)
Have not rec'd from pharmacy yet

## 2016-03-20 NOTE — Progress Notes (Signed)
Subjective:    Patient ID: Nichole Cunningham, female    DOB: May 31, 1950, 66 y.o.   MRN: [id number]   Patient here today for follow up of chronic medical problems. No change since last visit. Patient has no complaints today.  Outpatient Encounter Prescriptions as of 03/20/2016  Medication Sig  . Cholecalciferol (VITAMIN D3 SUPER STRENGTH) 2000 UNITS TABS Take 1 tablet by mouth daily.  . fenofibrate 160 MG tablet Take 1 tablet (160 mg total) by mouth daily.  . Omega-3 Fatty Acids (OMEGA 3 PO) Take by mouth.  . rosuvastatin (CRESTOR) 10 MG tablet Take 1 tablet (10 mg total) by mouth daily.  Marland Kitchen zolpidem (AMBIEN) 10 MG tablet Take 0.5 tablets (5 mg total) by mouth at bedtime as needed.  . Red Yeast Rice Extract (RED YEAST RICE PO) Take by mouth.   No facility-administered encounter medications on file as of 03/20/2016.      Hyperlipidemia  This is a chronic problem. Recent lipid tests were reviewed and are variable. She has no history of diabetes, hypothyroidism or obesity. Current antihyperlipidemic treatment includes statins and fibric acid derivatives. The current treatment provides moderate improvement of lipids. Compliance problems include adherence to diet and adherence to exercise.  Risk factors for coronary artery disease include dyslipidemia, post-menopausal and a sedentary lifestyle.  insomnia Uses ambien which helps her rest well at night. No side effects- has been out of meds for awhile   Review of Systems  Constitutional: Negative.   HENT: Negative.   Respiratory: Negative.   Cardiovascular: Negative.   Gastrointestinal: Negative.   Genitourinary: Negative.   Neurological: Negative.   Psychiatric/Behavioral: Negative.   All other systems reviewed and are negative.      Objective:   Physical Exam  Constitutional: She is oriented to person, place, and time. She appears well-developed and well-nourished.  HENT:  Nose: Nose normal.  Mouth/Throat: Oropharynx is clear and  moist.  Eyes: EOM are normal.  Neck: Trachea normal, normal range of motion and full passive range of motion without pain. Neck supple. No JVD present. Carotid bruit is not present. No thyromegaly present.  Cardiovascular: Normal rate, regular rhythm, normal heart sounds and intact distal pulses.  Exam reveals no gallop and no friction rub.   No murmur heard. Pulmonary/Chest: Effort normal and breath sounds normal.  Abdominal: Soft. Bowel sounds are normal. She exhibits no distension and no mass. There is no tenderness.  Musculoskeletal: Normal range of motion.  Lymphadenopathy:    She has no cervical adenopathy.  Neurological: She is alert and oriented to person, place, and time. She has normal reflexes.  Skin: Skin is warm and dry.  Psychiatric: She has a normal mood and affect. Her behavior is normal. Judgment and thought content normal.    BP 118/80   Pulse 81   Temp 98 F (36.7 C) (Oral)   Ht _0  (1.575 m)   Wt 160 lb 9.6 oz (72.8 kg)   BMI 29.37 kg/m      Assessment & Plan:  1. Hyperlipidemia with target LDL less than 100 *low fat diet - fenofibrate 160 MG tablet; Take 1 tablet (160 mg total) by mouth daily.  Dispense: 90 tablet; Refill: 1 - rosuvastatin (CRESTOR) 10 MG tablet; Take 1 tablet (10 mg total) by mouth daily.  Dispense: 90 tablet; Refill: 1 - CMP14+EGFR - Lipid panel  2. Primary insomnia Bedtime routine - zolpidem (AMBIEN) 10 MG tablet; Take 0.5 tablets (5 mg total) by mouth at bedtime as  needed.  Dispense: 30 tablet; Refill: 2  3. BMI 29.0-29.9,adult Discussed diet and exercise for person with BMI >25 Will recheck weight in 3-6 months   Patient will schedule mammogram Labs pending Health maintenance reviewed Diet and exercise encouraged Continue all meds Follow up  In 6 months   [place], FNP

## 2016-03-21 LAB — CMP14+EGFR
A/G RATIO: 2 (ref 1.2–2.2)
ALT: 48 IU/L — AB (ref 0–32)
AST: 47 IU/L — ABNORMAL HIGH (ref 0–40)
Albumin: 4.7 g/dL (ref 3.6–4.8)
Alkaline Phosphatase: 74 IU/L (ref 39–117)
BILIRUBIN TOTAL: 0.5 mg/dL (ref 0.0–1.2)
BUN / CREAT RATIO: 16 (ref 12–28)
BUN: 15 mg/dL (ref 8–27)
CHLORIDE: 105 mmol/L (ref 96–106)
CO2: 24 mmol/L (ref 18–29)
Calcium: 9.7 mg/dL (ref 8.7–10.3)
Creatinine, Ser: 0.94 mg/dL (ref 0.57–1.00)
GFR calc non Af Amer: 64 mL/min/{1.73_m2} (ref >59)
GFR, EST AFRICAN AMERICAN: 74 mL/min/{1.73_m2} (ref >59)
GLOBULIN, TOTAL: 2.4 g/dL (ref 1.5–4.5)
Glucose: 90 mg/dL (ref 65–99)
POTASSIUM: 5 mmol/L (ref 3.5–5.2)
SODIUM: 143 mmol/L (ref 134–144)
TOTAL PROTEIN: 7.1 g/dL (ref 6.0–8.5)

## 2016-03-21 LAB — LIPID PANEL
CHOL/HDL RATIO: 4.7 ratio — AB (ref 0.0–4.4)
Cholesterol, Total: 210 mg/dL — ABNORMAL HIGH (ref 100–199)
HDL: 45 mg/dL (ref >39)
LDL Calculated: 108 mg/dL — ABNORMAL HIGH (ref 0–99)
Triglycerides: 284 mg/dL — ABNORMAL HIGH (ref 0–149)
VLDL Cholesterol Cal: 57 mg/dL — ABNORMAL HIGH (ref 5–40)

## 2016-06-20 DIAGNOSIS — Z0100 Encounter for examination of eyes and vision without abnormal findings: Secondary | ICD-10-CM | POA: Diagnosis not present

## 2016-06-20 DIAGNOSIS — H2510 Age-related nuclear cataract, unspecified eye: Secondary | ICD-10-CM | POA: Diagnosis not present

## 2016-06-20 DIAGNOSIS — H5200 Hypermetropia, unspecified eye: Secondary | ICD-10-CM | POA: Diagnosis not present

## 2016-06-29 ENCOUNTER — Ambulatory Visit (INDEPENDENT_AMBULATORY_CARE_PROVIDER_SITE_OTHER): Payer: Medicare HMO | Admitting: Nurse Practitioner

## 2016-06-29 ENCOUNTER — Encounter: Payer: Self-pay | Admitting: Nurse Practitioner

## 2016-06-29 VITALS — BP 131/81 | HR 72 | Temp 97.0°F | Ht 62.0 in | Wt 161.0 lb

## 2016-06-29 DIAGNOSIS — Z6829 Body mass index (BMI) 29.0-29.9, adult: Secondary | ICD-10-CM | POA: Diagnosis not present

## 2016-06-29 DIAGNOSIS — R51 Headache: Secondary | ICD-10-CM

## 2016-06-29 DIAGNOSIS — M25511 Pain in right shoulder: Secondary | ICD-10-CM

## 2016-06-29 DIAGNOSIS — F5101 Primary insomnia: Secondary | ICD-10-CM | POA: Diagnosis not present

## 2016-06-29 DIAGNOSIS — R519 Headache, unspecified: Secondary | ICD-10-CM

## 2016-06-29 DIAGNOSIS — R69 Illness, unspecified: Secondary | ICD-10-CM | POA: Diagnosis not present

## 2016-06-29 DIAGNOSIS — E785 Hyperlipidemia, unspecified: Secondary | ICD-10-CM

## 2016-06-29 LAB — CMP14+EGFR
A/G RATIO: 2.6 — AB (ref 1.2–2.2)
ALK PHOS: 61 IU/L (ref 39–117)
ALT: 38 IU/L — AB (ref 0–32)
AST: 37 IU/L (ref 0–40)
Albumin: 5.1 g/dL — ABNORMAL HIGH (ref 3.6–4.8)
BUN/Creatinine Ratio: 26 (ref 12–28)
BUN: 24 mg/dL (ref 8–27)
Bilirubin Total: 0.9 mg/dL (ref 0.0–1.2)
CALCIUM: 9.8 mg/dL (ref 8.7–10.3)
CHLORIDE: 101 mmol/L (ref 96–106)
CO2: 23 mmol/L (ref 18–29)
Creatinine, Ser: 0.93 mg/dL (ref 0.57–1.00)
GFR calc Af Amer: 75 mL/min/{1.73_m2} (ref >59)
GFR calc non Af Amer: 65 mL/min/{1.73_m2} (ref >59)
GLOBULIN, TOTAL: 2 g/dL (ref 1.5–4.5)
Glucose: 90 mg/dL (ref 65–99)
POTASSIUM: 4.8 mmol/L (ref 3.5–5.2)
SODIUM: 142 mmol/L (ref 134–144)
Total Protein: 7.1 g/dL (ref 6.0–8.5)

## 2016-06-29 LAB — LIPID PANEL
CHOL/HDL RATIO: 2.8 ratio (ref 0.0–4.4)
CHOLESTEROL TOTAL: 136 mg/dL (ref 100–199)
HDL: 49 mg/dL (ref >39)
LDL Calculated: 63 mg/dL (ref 0–99)
TRIGLYCERIDES: 118 mg/dL (ref 0–149)
VLDL Cholesterol Cal: 24 mg/dL (ref 5–40)

## 2016-06-29 MED ORDER — FENOFIBRATE 160 MG PO TABS: 160.0000 mg | ORAL_TABLET | Freq: Every day | ORAL | 1 refills | Status: DC

## 2016-06-29 MED ORDER — ROSUVASTATIN CALCIUM 10 MG PO TABS: 10.0000 mg | ORAL_TABLET | Freq: Every day | ORAL | 1 refills | Status: DC

## 2016-06-29 MED ORDER — ZOLPIDEM TARTRATE 10 MG PO TABS: 5.0000 mg | ORAL_TABLET | Freq: Every evening | ORAL | 2 refills | Status: DC | PRN

## 2016-06-29 NOTE — Patient Instructions (Signed)
General Headache Without Cause A headache is pain or discomfort felt around the head or neck area. There are many causes and types of headaches. In some cases, the cause may not be found. Follow these instructions at home: Managing pain   Take over-the-counter and prescription medicines only as told by your doctor.  Lie down in a dark, quiet room when you have a headache.  If directed, apply ice to the head and neck area:  Put ice in a plastic bag.  Place a towel between your skin and the bag.  Leave the ice on for 20 minutes, 2-3 times per day.  Use a heating pad or hot shower to apply heat to the head and neck area as told by your doctor.  Keep lights dim if bright lights bother you or make your headaches worse. Eating and drinking   Eat meals on a regular schedule.  Lessen how much alcohol you drink.  Lessen how much caffeine you drink, or stop drinking caffeine. General instructions   Keep all follow-up visits as told by your doctor. This is important.  Keep a journal to find out if certain things bring on headaches. For example, write down:  What you eat and drink.  How much sleep you get.  Any change to your diet or medicines.  Relax by getting a massage or doing other relaxing activities.  Lessen stress.  Sit up straight. Do not tighten (tense) your muscles.  Do not use tobacco products. This includes cigarettes, chewing tobacco, or e-cigarettes. If you need help quitting, ask your doctor.  Exercise regularly as told by your doctor.  Get enough sleep. This often means 7-9 hours of sleep. Contact a doctor if:  Your symptoms are not helped by medicine.  You have a headache that feels different than the other headaches.  You feel sick to your stomach (nauseous) or you throw up (vomit).  You have a fever. Get help right away if:  Your headache becomes really bad.  You keep throwing up.  You have a stiff neck.  You have trouble seeing.  You have  trouble speaking.  You have pain in the eye or ear.  Your muscles are weak or you lose muscle control.  You lose your balance or have trouble walking.  You feel like you will pass out (faint) or you pass out.  You have confusion. This information is not intended to replace advice given to you by your health care provider. Make sure you discuss any questions you have with your health care provider. Document Released: 11/22/2007 Document Revised: 07/21/2015 Document Reviewed: 06/07/2014 Elsevier Interactive Patient Education  2017 [employer].

## 2016-06-29 NOTE — Progress Notes (Signed)
Subjective:    Patient ID: Nichole Cunningham, female    DOB: 1950-07-27, 66 y.o.   MRN: [id number]  HPI  Nichole Cunningham is here today for follow up of chronic medical problem.  Outpatient Encounter Prescriptions as of 06/29/2016  Medication Sig  . Cholecalciferol (VITAMIN D3 SUPER STRENGTH) 2000 UNITS TABS Take 1 tablet by mouth daily.  . fenofibrate 160 MG tablet Take 1 tablet (160 mg total) by mouth daily.  . Omega-3 Fatty Acids (OMEGA 3 PO) Take by mouth.  . Red Yeast Rice Extract (RED YEAST RICE PO) Take by mouth.  . rosuvastatin (CRESTOR) 10 MG tablet Take 1 tablet (10 mg total) by mouth daily.  Marland Kitchen zolpidem (AMBIEN) 10 MG tablet Take 0.5 tablets (5 mg total) by mouth at bedtime as needed.    1. BMI 29.0-29.9,adult  No recent weight gain or weight loss  2. Hyperlipidemia with target LDL less than 100  Does not watch diet very closely  3. Primary insomnia  Takes ambien to sleep at night. No side effects- feels rested in the morning    New complaints: Right shoulder pian- has been going on for over 3 months- seems to be worsening Headaches at night for the last week- has history of migraines     Review of Systems  Constitutional: Negative for diaphoresis.  Eyes: Negative for pain.  Respiratory: Negative for shortness of breath.   Cardiovascular: Negative for chest pain, palpitations and leg swelling.  Gastrointestinal: Negative for abdominal pain.  Endocrine: Negative for polydipsia.  Skin: Negative for rash.  Neurological: Negative for dizziness, weakness and headaches.  Hematological: Does not bruise/bleed easily.       Objective:   Physical Exam  Constitutional: She is oriented to person, place, and time. She appears well-developed and well-nourished.  HENT:  Nose: Nose normal.  Mouth/Throat: Oropharynx is clear and moist.  Eyes: EOM are normal.  Neck: Trachea normal, normal range of motion and full passive range of motion without pain. Neck supple. No JVD  present. Carotid bruit is not present. No thyromegaly present.  Cardiovascular: Normal rate, regular rhythm, normal heart sounds and intact distal pulses.  Exam reveals no gallop and no friction rub.   No murmur heard. Pulmonary/Chest: Effort normal and breath sounds normal.  Abdominal: Soft. Bowel sounds are normal. She exhibits no distension and no mass. There is no tenderness.  Musculoskeletal: Normal range of motion.  FROM of right shoulder with pain on abduction and internal rotation  Lymphadenopathy:    She has no cervical adenopathy.  Neurological: She is alert and oriented to person, place, and time. She has normal reflexes. No cranial nerve deficit.  Skin: Skin is warm and dry.  Psychiatric: She has a normal mood and affect. Her behavior is normal. Judgment and thought content normal.   BP 131/81   Pulse 72   Temp 97 F (36.1 C) (Oral)   Ht '5\' 2"'$  (1.575 m)   Wt 161 lb (73 kg)   BMI 29.45 kg/m      Assessment & Plan:  1. BMI 29.0-29.9,adult Discussed diet and exercise for person with BMI >25 Will recheck weight in 3-6 months   2. Hyperlipidemia with target LDL less than 100 Low fat diet - fenofibrate 160 MG tablet; Take 1 tablet (160 mg total) by mouth daily.  Dispense: 90 tablet; Refill: 1 - rosuvastatin (CRESTOR) 10 MG tablet; Take 1 tablet (10 mg total) by mouth daily.  Dispense: 90 tablet; Refill: 1 - CMP14+EGFR -  Lipid panel  3. Primary insomnia Bedtime routine - zolpidem (AMBIEN) 10 MG tablet; Take 0.5 tablets (5 mg total) by mouth at bedtime as needed.  Dispense: 30 tablet; Refill: 2  4. Acute pain of right shoulder - referral to ortho  5. Acute nonintractable headache, unspecified headache type Nightly allergy pill Keep diary of headaches RTO if no better   Patient wiill schedule mammogram Labs pending Health maintenance reviewed Diet and exercise encouraged Continue all meds Follow up  In 6 months   [place], FNP

## 2016-07-16 ENCOUNTER — Telehealth: Payer: Self-pay | Admitting: Nurse Practitioner

## 2016-08-14 DIAGNOSIS — Z6828 Body mass index (BMI) 28.0-28.9, adult: Secondary | ICD-10-CM | POA: Diagnosis not present

## 2016-08-14 DIAGNOSIS — Z01419 Encounter for gynecological examination (general) (routine) without abnormal findings: Secondary | ICD-10-CM | POA: Diagnosis not present

## 2016-08-14 DIAGNOSIS — Z1231 Encounter for screening mammogram for malignant neoplasm of breast: Secondary | ICD-10-CM | POA: Diagnosis not present

## 2016-08-15 DIAGNOSIS — L258 Unspecified contact dermatitis due to other agents: Secondary | ICD-10-CM | POA: Diagnosis not present

## 2016-08-15 DIAGNOSIS — B078 Other viral warts: Secondary | ICD-10-CM | POA: Diagnosis not present

## 2016-08-15 DIAGNOSIS — L918 Other hypertrophic disorders of the skin: Secondary | ICD-10-CM | POA: Diagnosis not present

## 2016-11-14 ENCOUNTER — Telehealth: Payer: Self-pay | Admitting: Nurse Practitioner

## 2017-01-07 ENCOUNTER — Encounter: Payer: Self-pay | Admitting: Nurse Practitioner

## 2017-01-07 ENCOUNTER — Ambulatory Visit (INDEPENDENT_AMBULATORY_CARE_PROVIDER_SITE_OTHER): Payer: Medicare HMO | Admitting: Nurse Practitioner

## 2017-01-07 VITALS — BP 126/78 | HR 76 | Temp 97.7°F | Ht 62.0 in | Wt 164.0 lb

## 2017-01-07 DIAGNOSIS — Z6829 Body mass index (BMI) 29.0-29.9, adult: Secondary | ICD-10-CM | POA: Diagnosis not present

## 2017-01-07 DIAGNOSIS — Z23 Encounter for immunization: Secondary | ICD-10-CM

## 2017-01-07 DIAGNOSIS — E785 Hyperlipidemia, unspecified: Secondary | ICD-10-CM

## 2017-01-07 DIAGNOSIS — R69 Illness, unspecified: Secondary | ICD-10-CM | POA: Diagnosis not present

## 2017-01-07 DIAGNOSIS — F5101 Primary insomnia: Secondary | ICD-10-CM | POA: Diagnosis not present

## 2017-01-07 MED ORDER — FENOFIBRATE 160 MG PO TABS: 160.0000 mg | ORAL_TABLET | Freq: Every day | ORAL | 1 refills | Status: DC

## 2017-01-07 MED ORDER — ROSUVASTATIN CALCIUM 10 MG PO TABS: 10.0000 mg | ORAL_TABLET | Freq: Every day | ORAL | 1 refills | Status: DC

## 2017-01-07 NOTE — Addendum Note (Signed)
Addended by: Rolena Infante on: 01/07/2017 09:50 AM   Modules accepted: Orders

## 2017-01-07 NOTE — Patient Instructions (Signed)
Fat and Cholesterol Restricted Diet High levels of fat and cholesterol in your blood may lead to various health problems, such as diseases of the heart, blood vessels, gallbladder, liver, and pancreas. Fats are concentrated sources of energy that come in various forms. Certain types of fat, including saturated fat, may be harmful in excess. Cholesterol is a substance needed by your body in small amounts. Your body makes all the cholesterol it needs. Excess cholesterol comes from the food you eat. When you have high levels of cholesterol and saturated fat in your blood, health problems can develop because the excess fat and cholesterol will gather along the walls of your blood vessels, causing them to narrow. Choosing the right foods will help you control your intake of fat and cholesterol. This will help keep the levels of these substances in your blood within normal limits and reduce your risk of disease. What is my plan? Your health care provider recommends that you:  Limit your fat intake to ___20___% or less of your total calories per day.  Limit the amount of cholesterol in your diet to less than ____200_____mg per day.  Eat 20-30 grams of fiber each day.  What types of fat should I choose?  Choose healthy fats more often. Choose monounsaturated and polyunsaturated fats, such as olive and canola oil, flaxseeds, walnuts, almonds, and seeds.  Eat more omega-3 fats. Good choices include salmon, mackerel, sardines, tuna, flaxseed oil, and ground flaxseeds. Aim to eat fish at least two times a week.  Limit saturated fats. Saturated fats are primarily found in animal products, such as meats, butter, and cream. Plant sources of saturated fats include palm oil, palm kernel oil, and coconut oil.  Avoid foods with partially hydrogenated oils in them. These contain trans fats. Examples of foods that contain trans fats are stick margarine, some tub margarines, cookies, crackers, and other baked  goods. What general guidelines do I need to follow? These guidelines for healthy eating will help you control your intake of fat and cholesterol:  Check food labels carefully to identify foods with trans fats or high amounts of saturated fat.  Fill one half of your plate with vegetables and green salads.  Fill one fourth of your plate with whole grains. Look for the word "whole" as the first word in the ingredient list.  Fill one fourth of your plate with lean protein foods.  Limit fruit to two servings a day. Choose fruit instead of juice.  Eat more foods that contain fiber, such as apples, broccoli, carrots, beans, peas, and barley.  Eat more home-cooked food and less restaurant, buffet, and fast food.  Limit or avoid alcohol.  Limit foods high in starch and sugar.  Limit fried foods.  Cook foods using methods other than frying. Baking, boiling, grilling, and broiling are all great options.  Lose weight if you are overweight. Losing just 5-10% of your initial body weight can help your overall health and prevent diseases such as diabetes and heart disease.  What foods can I eat? Grains  Whole grains, such as whole wheat or whole grain breads, crackers, cereals, and pasta. Unsweetened oatmeal, bulgur, barley, quinoa, or brown rice. Corn or whole wheat flour tortillas. Vegetables  Fresh or frozen vegetables (raw, steamed, roasted, or grilled). Green salads. Fruits  All fresh, canned (in natural juice), or frozen fruits. Meats and other protein foods  Ground beef (85% or leaner), grass-fed beef, or beef trimmed of fat. Skinless chicken or [place]. Ground chicken or [place].  Pork trimmed of fat. All fish and seafood. Eggs. Dried beans, peas, or lentils. Unsalted nuts or seeds. Unsalted canned or dry beans. Dairy  Low-fat dairy products, such as skim or 1% milk, 2% or reduced-fat cheeses, low-fat ricotta or cottage cheese, or plain low-fat yo Fats and oils  Tub margarines  without trans fats. Light or reduced-fat mayonnaise and salad dressings. Avocado. Olive, canola, sesame, or safflower oils. Natural peanut or almond butter (choose ones without added sugar and oil). The items listed above may not be a complete list of recommended foods or beverages. Contact your dietitian for more options. Foods to avoid Grains  White bread. White pasta. White rice. Cornbread. Bagels, pastries, and croissants. Crackers that contain trans fat. Vegetables  White potatoes. Corn. Creamed or fried vegetables. Vegetables in a cheese sauce. Fruits  Dried fruits. Canned fruit in light or heavy syrup. Fruit juice. Meats and other protein foods  Fatty cuts of meat. Ribs, chicken wings, bacon, sausage, bologna, salami, chitterlings, fatback, hot dogs, bratwurst, and packaged luncheon meats. Liver and organ meats. Dairy  Whole or 2% milk, cream, half-and-half, and cream cheese. Whole milk cheeses. Whole-fat or sweetened yogurt. Full-fat cheeses. Nondairy creamers and whipped toppings. Processed cheese, cheese spreads, or cheese curds. Beverages  Alcohol. Sweetened drinks (such as sodas, lemonade, and fruit drinks or punches). Fats and oils  Butter, stick margarine, lard, shortening, ghee, or bacon fat. Coconut, palm kernel, or palm oils. Sweets and desserts  Corn syrup, sugars, honey, and molasses. Candy. Jam and jelly. Syrup. Sweetened cereals. Cookies, pies, cakes, donuts, muffins, and ice cream. The items listed above may not be a complete list of foods and beverages to avoid. Contact your dietitian for more information. This information is not intended to replace advice given to you by your health care provider. Make sure you discuss any questions you have with your health care provider. Document Released: 02/12/2005 Document Revised: 03/05/2014 Document Reviewed: 05/13/2013 Elsevier Interactive Patient Education  2017 [employer].

## 2017-01-07 NOTE — Addendum Note (Signed)
Addended by: Rolena Infante on: 01/07/2017 09:58 AM   Modules accepted: Orders

## 2017-01-07 NOTE — Progress Notes (Signed)
Subjective:    Patient ID: Nichole Cunningham, female    DOB: 07-18-1950, 66 y.o.   MRN: [id number]  HPI   RAILEY GLAD is here today for follow up of chronic medical problem.  Outpatient Encounter Medications as of 01/07/2017  Medication Sig  . Cholecalciferol (VITAMIN D3 SUPER STRENGTH) 2000 UNITS TABS Take 1 tablet by mouth daily.  . fenofibrate 160 MG tablet Take 1 tablet (160 mg total) by mouth daily.  . Omega-3 Fatty Acids (OMEGA 3 PO) Take by mouth.  . Red Yeast Rice Extract (RED YEAST RICE PO) Take by mouth.  . rosuvastatin (CRESTOR) 10 MG tablet Take 1 tablet (10 mg total) by mouth daily.  Marland Kitchen zolpidem (AMBIEN) 10 MG tablet Take 0.5 tablets (5 mg total) by mouth at bedtime as needed.   No facility-administered encounter medications on file as of 01/07/2017.     1. Primary insomnia  Takes ambien to sleep at night. Does not take every night but does a couple times a month.  2. Hyperlipidemia with target LDL less than 100  Has not been watching diet very closely  3. BMI 29.0-29.9,adult  No recent weight changes    New complaints: None today  Social history: Retired- lives with husband- keeps her grandson.   Review of Systems  Constitutional: Negative for activity change and appetite change.  HENT: Negative.   Eyes: Negative for pain.  Respiratory: Negative for shortness of breath.   Cardiovascular: Negative for chest pain, palpitations and leg swelling.  Gastrointestinal: Negative for abdominal pain.  Endocrine: Negative for polydipsia.  Genitourinary: Negative.   Skin: Negative for rash.  Neurological: Negative for dizziness, weakness and headaches.  Hematological: Does not bruise/bleed easily.  Psychiatric/Behavioral: Negative.   All other systems reviewed and are negative.      Objective:   Physical Exam  Constitutional: She is oriented to person, place, and time. She appears well-developed and well-nourished.  HENT:  Nose: Nose normal.  Mouth/Throat:  Oropharynx is clear and moist.  Eyes: EOM are normal.  Neck: Trachea normal, normal range of motion and full passive range of motion without pain. Neck supple. No JVD present. Carotid bruit is not present. No thyromegaly present.  Cardiovascular: Normal rate, regular rhythm, normal heart sounds and intact distal pulses. Exam reveals no gallop and no friction rub.  No murmur heard. Pulmonary/Chest: Effort normal and breath sounds normal.  Abdominal: Soft. Bowel sounds are normal. She exhibits no distension and no mass. There is no tenderness.  Musculoskeletal: Normal range of motion.  Lymphadenopathy:    She has no cervical adenopathy.  Neurological: She is alert and oriented to person, place, and time. She has normal reflexes.  Skin: Skin is warm and dry.  Psychiatric: She has a normal mood and affect. Her behavior is normal. Judgment and thought content normal.    BP 126/78   Pulse 76   Temp 97.7 F (36.5 C) (Oral)   Ht 5\' 2"  (1.575 m)   Wt 164 lb (74.4 kg)   BMI 30.00 kg/m      Assessment & Plan:  1. Primary insomnia bedtime routine  2. Hyperlipidemia with target LDL less than 100 Low fat diet - fenofibrate 160 MG tablet; Take 1 tablet (160 mg total) daily by mouth.  Dispense: 90 tablet; Refill: 1 - rosuvastatin (CRESTOR) 10 MG tablet; Take 1 tablet (10 mg total) daily by mouth.  Dispense: 90 tablet; Refill: 1  3. BMI 29.0-29.9,adult Discussed diet and exercise for person with  BMI >25 Will recheck weight in 3-6 months    Labs pending Health maintenance reviewed Diet and exercise encouraged Continue all meds Follow up  In 6 months   Gauley Bridge, FNP

## 2017-01-08 LAB — CMP14+EGFR
A/G RATIO: 2 (ref 1.2–2.2)
ALT: 21 IU/L (ref 0–32)
AST: 24 IU/L (ref 0–40)
Albumin: 4.7 g/dL (ref 3.6–4.8)
Alkaline Phosphatase: 65 IU/L (ref 39–117)
BUN/Creatinine Ratio: 20 (ref 12–28)
BUN: 20 mg/dL (ref 8–27)
Bilirubin Total: 0.8 mg/dL (ref 0.0–1.2)
CALCIUM: 9.9 mg/dL (ref 8.7–10.3)
CO2: 21 mmol/L (ref 20–29)
CREATININE: 0.98 mg/dL (ref 0.57–1.00)
Chloride: 104 mmol/L (ref 96–106)
GFR, EST AFRICAN AMERICAN: 70 mL/min/{1.73_m2} (ref >59)
GFR, EST NON AFRICAN AMERICAN: 61 mL/min/{1.73_m2} (ref >59)
GLUCOSE: 91 mg/dL (ref 65–99)
Globulin, Total: 2.4 g/dL (ref 1.5–4.5)
Potassium: 4.3 mmol/L (ref 3.5–5.2)
Sodium: 141 mmol/L (ref 134–144)
TOTAL PROTEIN: 7.1 g/dL (ref 6.0–8.5)

## 2017-01-08 LAB — LIPID PANEL
Chol/HDL Ratio: 3 ratio (ref 0.0–4.4)
Cholesterol, Total: 141 mg/dL (ref 100–199)
HDL: 47 mg/dL (ref >39)
LDL CALC: 64 mg/dL (ref 0–99)
TRIGLYCERIDES: 148 mg/dL (ref 0–149)
VLDL Cholesterol Cal: 30 mg/dL (ref 5–40)

## 2017-03-05 ENCOUNTER — Telehealth: Payer: Self-pay | Admitting: Nurse Practitioner

## 2017-03-05 ENCOUNTER — Other Ambulatory Visit: Payer: Self-pay | Admitting: Nurse Practitioner

## 2017-03-05 MED ORDER — OSELTAMIVIR PHOSPHATE 75 MG PO CAPS: 75.0000 mg | ORAL_CAPSULE | Freq: Every day | ORAL | 0 refills | Status: DC

## 2017-03-05 NOTE — Telephone Encounter (Signed)
Pt aware rx sent into pharmacy. 

## 2017-03-05 NOTE — Telephone Encounter (Signed)
Please review and advise.

## 2017-03-05 NOTE — Telephone Encounter (Signed)
tamiflu rx sent to pharmacy

## 2017-04-01 ENCOUNTER — Encounter: Payer: Self-pay | Admitting: Family Medicine

## 2017-04-01 ENCOUNTER — Ambulatory Visit (INDEPENDENT_AMBULATORY_CARE_PROVIDER_SITE_OTHER): Payer: Medicare HMO | Admitting: Family Medicine

## 2017-04-01 VITALS — BP 133/81 | HR 98 | Temp 98.0°F | Ht 62.0 in | Wt 160.0 lb

## 2017-04-01 DIAGNOSIS — J029 Acute pharyngitis, unspecified: Secondary | ICD-10-CM

## 2017-04-01 MED ORDER — FLUTICASONE PROPIONATE 50 MCG/ACT NA SUSP: 1.0000 | Freq: Two times a day (BID) | NASAL | 6 refills | Status: DC | PRN

## 2017-04-01 NOTE — Progress Notes (Signed)
BP 133/81   Pulse 98   Temp 98 F (36.7 C) (Oral)   Ht 5\' 2"  (1.575 m)   Wt 160 lb (72.6 kg)   BMI 29.26 kg/m    Subjective:    Patient ID: Nichole Cunningham, female    DOB: [birth year], 67 y.o.   MRN: [id number]  HPI: Nichole Cunningham is a 67 y.o. female presenting on 04/01/2017 for Cough, drainage in throat (x 5 days, has taken Tylenol and Nyquil)   HPI  Cough: Patient presents to the clinic with cough x 5 days. Patient describes her cough as a dry, constant cough that is continuous throughout the day. She admits to having a sore throat, problems swallowing, runny nose, and chills. Patient states it feels like something is stuck in her throat which is causing problems swallowing. She has taken both Nyquil and Tylenol with relief, but her symptoms do not seem to be improving. Patient denies SOB, chest pain, heartburn, and fever. Patient denies being around any sick contacts.   Tinnitus: Patient also admits to having tinnitus bilaterally for a couple of months, but it has not worsened over the course of her acute illness. Patient admits to having hearing loss over the past few years which has not impacted her daily life yet. She is retired and no longer gets her hearing checked which was performed years ago at work. Patient has not tried anything OTC for her tinnitus. She denies any medication changes.   Relevant past medical, surgical, family and social history reviewed and updated as indicated. Interim medical history since our last visit reviewed. Allergies and medications reviewed and updated.  Review of Systems  Constitutional: Positive for chills. Negative for fever.  HENT: Positive for congestion, hearing loss (patient admits to hearing loss over the past years; has not gotten worse with illness), rhinorrhea, sore throat, tinnitus (for a couple a months, but has not worsened with illness (bilateral)) and trouble swallowing. Negative for ear discharge, ear pain, postnasal drip, sinus  pressure and sinus pain.   Eyes: Negative for pain, discharge, redness and itching.  Respiratory: Positive for cough (dry, constant cough). Negative for shortness of breath and wheezing.   Cardiovascular: Negative for chest pain.    Per HPI unless specifically indicated above   Allergies as of 04/01/2017   No Known Allergies     Medication List        Accurate as of 04/01/17  8:49 AM. Always use your most recent med list.          fenofibrate 160 MG tablet Take 1 tablet (160 mg total) daily by mouth.   fluticasone 50 MCG/ACT nasal spray Commonly known as:  FLONASE Place 1 spray into both nostrils 2 (two) times daily as needed for allergies or rhinitis.   OMEGA 3 PO Take by mouth.   rosuvastatin 10 MG tablet Commonly known as:  CRESTOR Take 1 tablet (10 mg total) daily by mouth.   VITAMIN D3 SUPER STRENGTH 2000 units Tabs Generic drug:  Cholecalciferol Take 1 tablet by mouth daily.   zolpidem 10 MG tablet Commonly known as:  AMBIEN Take 0.5 tablets (5 mg total) by mouth at bedtime as needed.          Objective:    BP 133/81   Pulse 98   Temp 98 F (36.7 C) (Oral)   Ht 5\' 2"  (1.575 m)   Wt 160 lb (72.6 kg)   BMI 29.26 kg/m   Wt Readings from Last  3 Encounters:  04/01/17 160 lb (72.6 kg)  01/07/17 164 lb (74.4 kg)  06/29/16 161 lb (73 kg)    Physical Exam  Constitutional: She is oriented to person, place, and time. She appears well-developed and well-nourished. No distress.  HENT:  Head: Normocephalic.  Right Ear: Hearing, tympanic membrane, external ear and ear canal normal.  Left Ear: Hearing, tympanic membrane, external ear and ear canal normal.  Nose: Rhinorrhea present. Right sinus exhibits no maxillary sinus tenderness and no frontal sinus tenderness. Left sinus exhibits no maxillary sinus tenderness and no frontal sinus tenderness.  Mouth/Throat: Uvula is midline. Posterior oropharyngeal erythema present. No oropharyngeal exudate or posterior  oropharyngeal edema.  Eyes: Conjunctivae are normal. Right eye exhibits no discharge. Left eye exhibits no discharge.  Neck: No tracheal deviation present. No thyromegaly present.  Cardiovascular: Normal rate, regular rhythm and normal heart sounds. Exam reveals no gallop and no friction rub.  No murmur heard. Pulmonary/Chest: Effort normal and breath sounds normal. No stridor. No respiratory distress. She has no wheezes. She has no rales.  Lymphadenopathy:    She has no cervical adenopathy.  Neurological: She is alert and oriented to person, place, and time.  Skin: She is not diaphoretic.  Psychiatric: She has a normal mood and affect. Her behavior is normal. Judgment and thought content normal.        Assessment & Plan:   Problem List Items Addressed This Visit    None    Visit Diagnoses    Acute pharyngitis, unspecified etiology    -  Primary   Recommended Flonase and Mucinex and an antihistamine and an NSAID, call back in 3 or 4 days if not improved   Relevant Medications   fluticasone (FLONASE) 50 MCG/ACT nasal spray    Acute Pharyngitis:  Patient presents to the clinic with signs and symptoms of acute pharyngitis such as sore throat, dysphagia, and a constant, dry cough. On physical exam, patient has post oropharyngeal erythema. Patient was instructed to take a NSAID po BID x 1 week, an allergy medication such as Zyrtec po daily x 1 week, and Flonase nasal spray 1 puff in each nostril BID x 1 week. Patient has been instructed to call the clinic in 3-4 days if her symptoms do not improve to potentially call in an antibiotic.   Tinnitus: Patient complains of tinnitus bilaterally for a few months. She admits to having hearing loss over the past few years. Patient has been educated on common causes of tinnitus such as age-related hearing loss. Patient has been instructed to get her hearing checked for the potential need of hearing aids. Patient has been instructed to attempt to have  white noise in the background to help with her tinnitus. Physical exam of the ear was insignificant.   Follow up plan: Return if symptoms worsen or fail to improve.  Counseling provided for all of the vaccine components No orders of the defined types were placed in this encounter.  Patient seen and examined with Lanna Poche PA student, agree with assessment and plan above. Caryl Pina, MD [facility] 04/04/2017, 3:43 PM

## 2017-04-02 ENCOUNTER — Telehealth: Payer: Self-pay | Admitting: Family Medicine

## 2017-04-02 MED ORDER — POLYMYXIN B-TRIMETHOPRIM 10000-0.1 UNIT/ML-% OP SOLN: 1.0000 [drp] | OPHTHALMIC | 0 refills | Status: DC

## 2017-04-02 NOTE — Telephone Encounter (Signed)
Office visit with Dr. Warrick Parisian yesterday.    Please advise on medicine for eye.

## 2017-04-02 NOTE — Telephone Encounter (Signed)
I sent in Polytrim for the patient

## 2017-04-02 NOTE — Telephone Encounter (Signed)
What symptoms do you have? Right eye was matted together this morning and went to the pharmacy to get something for it. Was told it was probably pink eye. Patient was seen yesterday for congestion. Wants something called in for pink eye  How long have you been sick? Today  Have you been seen for this problem? NO  If your provider decides to give you a prescription, which pharmacy would you like for it to be sent to? [facility] in [place]   Patient informed that this information will be sent to the clinical staff for review and that they should receive a follow up call.

## 2017-04-02 NOTE — Telephone Encounter (Signed)
Aware.  Script ready. 

## 2017-04-08 ENCOUNTER — Ambulatory Visit (INDEPENDENT_AMBULATORY_CARE_PROVIDER_SITE_OTHER): Payer: Medicare HMO | Admitting: Family Medicine

## 2017-04-08 VITALS — BP 129/79 | HR 95 | Temp 98.4°F | Ht 62.0 in | Wt 161.0 lb

## 2017-04-08 DIAGNOSIS — J40 Bronchitis, not specified as acute or chronic: Secondary | ICD-10-CM | POA: Diagnosis not present

## 2017-04-08 DIAGNOSIS — J029 Acute pharyngitis, unspecified: Secondary | ICD-10-CM

## 2017-04-08 LAB — RAPID STREP SCREEN (MED CTR MEBANE ONLY): Strep Gp A Ag, IA W/Reflex: NEGATIVE

## 2017-04-08 LAB — CULTURE, GROUP A STREP

## 2017-04-08 MED ORDER — AMOXICILLIN-POT CLAVULANATE 875-125 MG PO TABS: 1.0000 | ORAL_TABLET | Freq: Two times a day (BID) | ORAL | 0 refills | Status: DC

## 2017-04-08 MED ORDER — LIDOCAINE VISCOUS 2 % MT SOLN: OROMUCOSAL | 0 refills | Status: DC

## 2017-04-08 MED ORDER — BENZONATATE 200 MG PO CAPS: 200.0000 mg | ORAL_CAPSULE | Freq: Two times a day (BID) | ORAL | 0 refills | Status: DC | PRN

## 2017-04-08 NOTE — Patient Instructions (Signed)
Your strep test was negative.  It appears that you have a viral upper respiratory infection (cold).  Cold symptoms can last up to 2 weeks.  Because we are nearing the 2-week marker, I have prescribed you Augmentin to use if symptoms do not improve or if they worsen.  Please hold off on using this medication for now.  I have sent in a cough medication that you may use twice a day and a solution that you can swish and spit every 4 hours if needed for sore throat.  - Get plenty of rest and drink plenty of fluids. - Try to breathe moist air. Use a cold mist humidifier. - Consume warm fluids (soup or tea) to provide relief for a stuffy nose and to loosen phlegm. - For nasal stuffiness, try saline nasal spray or a Neti Pot. Afrin nasal spray can also be used but this product should not be used longer than 3 days or it will cause rebound nasal stuffiness (worsening nasal congestion). - For sore throat pain relief: suck on throat lozenges, hard candy or popsicles; gargle with warm salt water (1/4 tsp. salt per 8 oz. of water); and eat soft, bland foods. - Eat a well-balanced diet. If you cannot, ensure you are getting enough nutrients by taking a daily multivitamin. - Avoid dairy products, as they can thicken phlegm. - Avoid alcohol, as it impairs your body's immune system.  CONTACT YOUR DOCTOR IF YOU EXPERIENCE ANY OF THE FOLLOWING: - High fever - Ear pain - Sinus-type headache - Unusually severe cold symptoms - Cough that gets worse while other cold symptoms improve - Flare up of any chronic lung problem, such as asthma - Your symptoms persist longer than 2 weeks

## 2017-04-08 NOTE — Progress Notes (Signed)
Subjective: CC: URI/ ? Pink eye PCP: Chevis Pretty, FNP Nichole Cunningham is a 67 y.o. female presenting to clinic today for:  1. Cold symptoms  Patient reports cough, sore throat, headache that started 11 days ago.  Denies hemoptysis, SOB, dizziness, rash, nausea, vomiting, diarrhea, fevers, chills, myalgia, sick contacts, recent travel.  Denies dental or facial pain.  No difficulty swallowing.  She reports that the pinkeye has completely resolved with Polytrim eyedrops.  Patient has used Tylenol cough cold and flu and she has been gargling salt water with little relief of symptoms.  Denies history of COPD or asthma.  Denies tobacco use/ exposure.  ROS: Per HPI  No Known Allergies Past Medical History:  Diagnosis Date  . Complication of anesthesia   . Hyperlipidemia   . PONV (postoperative nausea and vomiting)     Current Outpatient Medications:  .  Cholecalciferol (VITAMIN D3 SUPER STRENGTH) 2000 UNITS TABS, Take 1 tablet by mouth daily., Disp: , Rfl:  .  fenofibrate 160 MG tablet, Take 1 tablet (160 mg total) daily by mouth., Disp: 90 tablet, Rfl: 1 .  fluticasone (FLONASE) 50 MCG/ACT nasal spray, Place 1 spray into both nostrils 2 (two) times daily as needed for allergies or rhinitis., Disp: 16 g, Rfl: 6 .  Omega-3 Fatty Acids (OMEGA 3 PO), Take by mouth., Disp: , Rfl:  .  rosuvastatin (CRESTOR) 10 MG tablet, Take 1 tablet (10 mg total) daily by mouth., Disp: 90 tablet, Rfl: 1 .  trimethoprim-polymyxin b (POLYTRIM) ophthalmic solution, Place 1 drop into both eyes every 4 (four) hours. Give enough for 10 days, Disp: 10 mL, Rfl: 0 .  zolpidem (AMBIEN) 10 MG tablet, Take 0.5 tablets (5 mg total) by mouth at bedtime as needed., Disp: 30 tablet, Rfl: 2 .  amoxicillin-clavulanate (AUGMENTIN) 875-125 MG tablet, Take 1 tablet by mouth 2 (two) times daily., Disp: 20 tablet, Rfl: 0 .  benzonatate (TESSALON) 200 MG capsule, Take 1 capsule (200 mg total) by mouth 2 (two) times daily  as needed for cough., Disp: 20 capsule, Rfl: 0 .  lidocaine (XYLOCAINE) 2 % solution, Swish and spit 10 mL every 4 hours as needed for sore throat., Disp: 100 mL, Rfl: 0 Social History   Socioeconomic History  . Marital status: Married    Spouse name: Not on file  . Number of children: Not on file  . Years of education: Not on file  . Highest education level: Not on file  Social Needs  . Financial resource strain: Not on file  . Food insecurity - worry: Not on file  . Food insecurity - inability: Not on file  . Transportation needs - medical: Not on file  . Transportation needs - non-medical: Not on file  Occupational History  . Not on file  Tobacco Use  . Smoking status: Never Smoker  . Smokeless tobacco: Never Used  Substance and Sexual Activity  . Alcohol use: No  . Drug use: No  . Sexual activity: Not on file  Other Topics Concern  . Not on file  Social History Narrative  . Not on file   Family History  Problem Relation Age of Onset  . Anesthesia problems Neg Hx   . Hypotension Neg Hx   . Malignant hyperthermia Neg Hx   . Pseudochol deficiency Neg Hx   . Colon cancer Neg Hx   . Stomach cancer Neg Hx     Objective: Office vital signs reviewed. BP 129/79   Pulse 95  Temp 98.4 F (36.9 C) (Oral)   Ht 5\' 2"  (1.575 m)   Wt 161 lb (73 kg)   SpO2 96%   BMI 29.45 kg/m   Physical Examination:  General: Awake, alert, well nourished, tired appearing, No acute distress HEENT: No sinus tenderness to palpation    Neck: No masses palpated. No lymphadenopathy    Ears: Tympanic membranes intact, normal light reflex, no erythema, no bulging    Eyes: PERRLA, extraocular membranes intact, sclera white    Nose: nasal turbinates moist, clear nasal discharge    Throat: moist mucus membranes, moderate oropharyngeal erythema, grade 2 tonsils with no tonsillar exudate.  Airway is patent Cardio: regular rate and rhythm, S1S2 heard, no murmurs appreciated Pulm: clear to  auscultation bilaterally, no wheezes, rhonchi or rales; normal work of breathing on room air  Assessment/ Plan: 67 y.o. female   1. Bronchitis I still think this is likely viral.  However, given persistent symptoms and borderline prolonged duration I did provide her a pocket prescription for Augmentin 875 p.o. twice daily for 10 days.  I did recommend that she wait a couple more days before starting this medication unless symptoms were to worsen prior to that time.  In the interim, continue supportive care.  Tessalon Perles and viscous lidocaine prescribed for symptom control.  Home care instructions were reviewed with the patient and a handout was provided. Strict return precautions and reasons for emergent evaluation in the emergency department review with patient.  They voiced understanding and will follow-up as needed.  2. Sore throat Rapid strep was negative. - Rapid Strep Screen (Not at [facility])   Orders Placed This Encounter  Procedures  . Rapid Strep Screen (Not at [facility])  . Culture, Group A Strep   Meds ordered this encounter  Medications  . amoxicillin-clavulanate (AUGMENTIN) 875-125 MG tablet    Sig: Take 1 tablet by mouth 2 (two) times daily.    Dispense:  20 tablet    Refill:  0  . benzonatate (TESSALON) 200 MG capsule    Sig: Take 1 capsule (200 mg total) by mouth 2 (two) times daily as needed for cough.    Dispense:  20 capsule    Refill:  0  . lidocaine (XYLOCAINE) 2 % solution    Sig: Swish and spit 10 mL every 4 hours as needed for sore throat.    Dispense:  100 mL    Refill:  [place], DO [facility] [phone] '

## 2017-05-31 DIAGNOSIS — Z8249 Family history of ischemic heart disease and other diseases of the circulatory system: Secondary | ICD-10-CM | POA: Diagnosis not present

## 2017-05-31 DIAGNOSIS — L309 Dermatitis, unspecified: Secondary | ICD-10-CM | POA: Diagnosis not present

## 2017-05-31 DIAGNOSIS — Z809 Family history of malignant neoplasm, unspecified: Secondary | ICD-10-CM | POA: Diagnosis not present

## 2017-05-31 DIAGNOSIS — H269 Unspecified cataract: Secondary | ICD-10-CM | POA: Diagnosis not present

## 2017-05-31 DIAGNOSIS — E785 Hyperlipidemia, unspecified: Secondary | ICD-10-CM | POA: Diagnosis not present

## 2017-05-31 DIAGNOSIS — Z7722 Contact with and (suspected) exposure to environmental tobacco smoke (acute) (chronic): Secondary | ICD-10-CM | POA: Diagnosis not present

## 2017-05-31 DIAGNOSIS — Z833 Family history of diabetes mellitus: Secondary | ICD-10-CM | POA: Diagnosis not present

## 2017-05-31 DIAGNOSIS — G4700 Insomnia, unspecified: Secondary | ICD-10-CM | POA: Diagnosis not present

## 2017-05-31 DIAGNOSIS — N393 Stress incontinence (female) (male): Secondary | ICD-10-CM | POA: Diagnosis not present

## 2017-06-28 ENCOUNTER — Encounter (INDEPENDENT_AMBULATORY_CARE_PROVIDER_SITE_OTHER): Payer: Self-pay

## 2017-06-28 ENCOUNTER — Encounter: Payer: Self-pay | Admitting: Nurse Practitioner

## 2017-06-28 ENCOUNTER — Ambulatory Visit (INDEPENDENT_AMBULATORY_CARE_PROVIDER_SITE_OTHER): Payer: Medicare HMO | Admitting: Nurse Practitioner

## 2017-06-28 ENCOUNTER — Ambulatory Visit (INDEPENDENT_AMBULATORY_CARE_PROVIDER_SITE_OTHER): Payer: Medicare HMO

## 2017-06-28 VITALS — BP 135/88 | Temp 97.3°F | Ht 62.0 in | Wt 160.0 lb

## 2017-06-28 DIAGNOSIS — E785 Hyperlipidemia, unspecified: Secondary | ICD-10-CM | POA: Diagnosis not present

## 2017-06-28 DIAGNOSIS — Z6829 Body mass index (BMI) 29.0-29.9, adult: Secondary | ICD-10-CM | POA: Diagnosis not present

## 2017-06-28 DIAGNOSIS — M542 Cervicalgia: Secondary | ICD-10-CM

## 2017-06-28 DIAGNOSIS — R69 Illness, unspecified: Secondary | ICD-10-CM | POA: Diagnosis not present

## 2017-06-28 DIAGNOSIS — F5101 Primary insomnia: Secondary | ICD-10-CM

## 2017-06-28 MED ORDER — FENOFIBRATE 160 MG PO TABS: 160.0000 mg | ORAL_TABLET | Freq: Every day | ORAL | 1 refills | Status: DC

## 2017-06-28 MED ORDER — ZOLPIDEM TARTRATE 10 MG PO TABS: 5.0000 mg | ORAL_TABLET | Freq: Every evening | ORAL | 2 refills | Status: DC | PRN

## 2017-06-28 MED ORDER — PREDNISONE 10 MG (21) PO TBPK: ORAL_TABLET | ORAL | 0 refills | Status: DC

## 2017-06-28 MED ORDER — ROSUVASTATIN CALCIUM 10 MG PO TABS
10.0000 mg | ORAL_TABLET | Freq: Every day | ORAL | 1 refills | Status: DC
Start: 2017-06-28 — End: 2018-03-06

## 2017-06-28 NOTE — Progress Notes (Addendum)
Subjective:    Patient ID: Nichole Cunningham, female    DOB: 1950-03-19, 67 y.o.   MRN: [id number]   Chief Complaint: medical management of chronic medical problems  HPI:  1. Primary insomnia  Patient takes ambien to sleep at night. Works well for her. Feels rested in mornings.  2. Hyperlipidemia with target LDL less than 100  Tries to watch diet. Does very little exercise.  3. BMI 29.0-29.9,adult  No recent weight changes    Outpatient Encounter Medications as of 06/28/2017  Medication Sig  . Cholecalciferol (VITAMIN D3 SUPER STRENGTH) 2000 UNITS TABS Take 1 tablet by mouth daily.  . fenofibrate 160 MG tablet Take 1 tablet (160 mg total) daily by mouth.  . fluticasone (FLONASE) 50 MCG/ACT nasal spray Place 1 spray into both nostrils 2 (two) times daily as needed for allergies or rhinitis.  Marland Kitchen lidocaine (XYLOCAINE) 2 % solution Swish and spit 10 mL every 4 hours as needed for sore throat.  . Omega-3 Fatty Acids (OMEGA 3 PO) Take by mouth.  . rosuvastatin (CRESTOR) 10 MG tablet Take 1 tablet (10 mg total) daily by mouth.  . trimethoprim-polymyxin b (POLYTRIM) ophthalmic solution Place 1 drop into both eyes every 4 (four) hours. Give enough for 10 days  . zolpidem (AMBIEN) 10 MG tablet Take 0.5 tablets (5 mg total) by mouth at bedtime as needed.       New complaints: - having pain in back of neck that radiates upward. Started about 1 month ago. She has taken some motrin at times which helps. -right keg going numb at night time. Does not happen every night but at least 2-3x a week. Moving makes it go away.  Social history: Still keeping her grandson-     Review of Systems  Constitutional: Negative for activity change and appetite change.  HENT: Negative.   Eyes: Negative for pain.  Respiratory: Negative for shortness of breath.   Cardiovascular: Negative for chest pain, palpitations and leg swelling.  Gastrointestinal: Negative for abdominal pain.  Endocrine: Negative for  polydipsia.  Genitourinary: Negative.   Skin: Negative for rash.  Neurological: Negative for dizziness, weakness and headaches.  Hematological: Does not bruise/bleed easily.  Psychiatric/Behavioral: Negative.   All other systems reviewed and are negative.      Objective:   Physical Exam  Constitutional: She is oriented to person, place, and time.  HENT:  Head: Normocephalic.  Nose: Nose normal.  Mouth/Throat: Oropharynx is clear and moist.  Eyes: Pupils are equal, round, and reactive to light. EOM are normal.  Neck: Normal range of motion. Neck supple. No JVD present. Carotid bruit is not present.  Cardiovascular: Normal rate, regular rhythm, normal heart sounds and intact distal pulses.  Pulmonary/Chest: Effort normal and breath sounds normal. No respiratory distress. She has no wheezes. She has no rales. She exhibits no tenderness.  Abdominal: Soft. Normal appearance, normal aorta and bowel sounds are normal. She exhibits no distension, no abdominal bruit, no pulsatile midline mass and no mass. There is no splenomegaly or hepatomegaly. There is no tenderness.  Musculoskeletal: Normal range of motion. She exhibits no edema.  Decreased rom of cervical spine with pain on flexion and extension Grips equal bil  Lymphadenopathy:    She has no cervical adenopathy.  Neurological: She is alert and oriented to person, place, and time. She has normal reflexes. She displays normal reflexes. No cranial nerve deficit.  Skin: Skin is warm and dry.  Psychiatric: She has a normal mood and affect.  Her behavior is normal. Judgment and thought content normal.   BP 135/88   Temp (!) 97.3 F (36.3 C) (Oral)   Ht '5\' 2"'$  (1.575 m)   Wt 160 lb (72.6 kg)   BMI 29.26 kg/m   Cervical xray- degenerative change sat c2-3-Preliminary reading by Ronnald Collum, FNP  [facility]      Assessment & Plan:  Nichole Cunningham comes in today with chief complaint of Medical Management of Chronic Issues   Diagnosis and  orders addressed:  1. Primary insomnia Bedtime routine - zolpidem (AMBIEN) 10 MG tablet; Take 0.5 tablets (5 mg total) by mouth at bedtime as needed.  Dispense: 30 tablet; Refill: 2  2. Hyperlipidemia with target LDL less than 100 Low fat diet - CMP14+EGFR - Lipid panel - fenofibrate 160 MG tablet; Take 1 tablet (160 mg total) by mouth daily.  Dispense: 90 tablet; Refill: 1 - rosuvastatin (CRESTOR) 10 MG tablet; Take 1 tablet (10 mg total) by mouth daily.  Dispense: 90 tablet; Refill: 1  3. BMI 29.0-29.9,adult Discussed diet and exercise for person with BMI >25 Will recheck weight in 3-6 months  4. Neck pain Will try prednisone- if does not help then will do referral to ortho - DG Cervical Spine Complete; Future - predniSONE (STERAPRED UNI-PAK 21 TAB) 10 MG (21) TBPK tablet; As directed x 6 days  Dispense: 21 tablet; Refill: 0  Patient will schedule mammogram Labs pending Health Maintenance reviewed Diet and exercise encouraged  Follow up plan: 6 months    Mary-Margaret Hassell Done, FNP

## 2017-06-28 NOTE — Patient Instructions (Signed)

## 2017-06-29 LAB — CMP14+EGFR
ALK PHOS: 91 IU/L (ref 39–117)
ALT: 29 IU/L (ref 0–32)
AST: 30 IU/L (ref 0–40)
Albumin/Globulin Ratio: 2 (ref 1.2–2.2)
Albumin: 5.1 g/dL — ABNORMAL HIGH (ref 3.6–4.8)
BILIRUBIN TOTAL: 1.8 mg/dL — AB (ref 0.0–1.2)
BUN/Creatinine Ratio: 19 (ref 12–28)
BUN: 16 mg/dL (ref 8–27)
CHLORIDE: 103 mmol/L (ref 96–106)
CO2: 21 mmol/L (ref 20–29)
Calcium: 10.3 mg/dL (ref 8.7–10.3)
Creatinine, Ser: 0.83 mg/dL (ref 0.57–1.00)
GFR calc Af Amer: 85 mL/min/{1.73_m2} (ref >59)
GFR calc non Af Amer: 74 mL/min/{1.73_m2} (ref >59)
GLUCOSE: 96 mg/dL (ref 65–99)
Globulin, Total: 2.5 g/dL (ref 1.5–4.5)
Potassium: 4.6 mmol/L (ref 3.5–5.2)
Sodium: 144 mmol/L (ref 134–144)
Total Protein: 7.6 g/dL (ref 6.0–8.5)

## 2017-06-29 LAB — LIPID PANEL
CHOLESTEROL TOTAL: 169 mg/dL (ref 100–199)
Chol/HDL Ratio: 3.4 ratio (ref 0.0–4.4)
HDL: 49 mg/dL (ref >39)
LDL Calculated: 66 mg/dL (ref 0–99)
Triglycerides: 269 mg/dL — ABNORMAL HIGH (ref 0–149)
VLDL CHOLESTEROL CAL: 54 mg/dL — AB (ref 5–40)

## 2017-09-18 DIAGNOSIS — M5136 Other intervertebral disc degeneration, lumbar region: Secondary | ICD-10-CM | POA: Diagnosis not present

## 2017-09-18 DIAGNOSIS — M545 Low back pain: Secondary | ICD-10-CM | POA: Diagnosis not present

## 2017-09-23 DIAGNOSIS — Z01419 Encounter for gynecological examination (general) (routine) without abnormal findings: Secondary | ICD-10-CM | POA: Diagnosis not present

## 2017-09-23 DIAGNOSIS — Z1231 Encounter for screening mammogram for malignant neoplasm of breast: Secondary | ICD-10-CM | POA: Diagnosis not present

## 2017-09-23 DIAGNOSIS — Z6829 Body mass index (BMI) 29.0-29.9, adult: Secondary | ICD-10-CM | POA: Diagnosis not present

## 2017-09-23 DIAGNOSIS — Z124 Encounter for screening for malignant neoplasm of cervix: Secondary | ICD-10-CM | POA: Diagnosis not present

## 2017-09-24 ENCOUNTER — Other Ambulatory Visit: Payer: Self-pay | Admitting: Obstetrics & Gynecology

## 2017-09-24 DIAGNOSIS — R928 Other abnormal and inconclusive findings on diagnostic imaging of breast: Secondary | ICD-10-CM

## 2017-09-30 ENCOUNTER — Ambulatory Visit
Admission: RE | Admit: 2017-09-30 | Discharge: 2017-09-30 | Disposition: A | Discharge status: Home / Self Care | Payer: Medicare HMO | Source: Ambulatory Visit | Attending: Obstetrics & Gynecology | Admitting: Obstetrics & Gynecology

## 2017-09-30 ENCOUNTER — Ambulatory Visit: Payer: Self-pay

## 2017-09-30 DIAGNOSIS — R928 Other abnormal and inconclusive findings on diagnostic imaging of breast: Secondary | ICD-10-CM | POA: Diagnosis not present

## 2017-10-04 ENCOUNTER — Other Ambulatory Visit: Payer: Self-pay

## 2017-10-04 ENCOUNTER — Other Ambulatory Visit: Payer: Self-pay | Admitting: Nurse Practitioner

## 2017-10-04 DIAGNOSIS — Z1239 Encounter for other screening for malignant neoplasm of breast: Secondary | ICD-10-CM

## 2017-10-14 ENCOUNTER — Other Ambulatory Visit: Payer: Self-pay | Admitting: Nurse Practitioner

## 2017-10-14 ENCOUNTER — Ambulatory Visit (INDEPENDENT_AMBULATORY_CARE_PROVIDER_SITE_OTHER): Payer: Medicare HMO

## 2017-10-14 ENCOUNTER — Other Ambulatory Visit: Payer: Medicare HMO

## 2017-10-14 DIAGNOSIS — Z780 Asymptomatic menopausal state: Secondary | ICD-10-CM

## 2017-10-14 DIAGNOSIS — M85852 Other specified disorders of bone density and structure, left thigh: Secondary | ICD-10-CM | POA: Diagnosis not present

## 2017-12-03 ENCOUNTER — Ambulatory Visit (INDEPENDENT_AMBULATORY_CARE_PROVIDER_SITE_OTHER): Payer: Medicare HMO

## 2017-12-03 ENCOUNTER — Encounter: Payer: Self-pay | Admitting: Nurse Practitioner

## 2017-12-03 ENCOUNTER — Ambulatory Visit (INDEPENDENT_AMBULATORY_CARE_PROVIDER_SITE_OTHER): Payer: Medicare HMO | Admitting: Nurse Practitioner

## 2017-12-03 VITALS — BP 139/89 | HR 79 | Temp 97.2°F | Ht 62.0 in | Wt 168.0 lb

## 2017-12-03 DIAGNOSIS — R109 Unspecified abdominal pain: Secondary | ICD-10-CM | POA: Diagnosis not present

## 2017-12-03 DIAGNOSIS — R0789 Other chest pain: Secondary | ICD-10-CM | POA: Diagnosis not present

## 2017-12-03 MED ORDER — NAPROXEN 500 MG PO TABS: 500.0000 mg | ORAL_TABLET | Freq: Two times a day (BID) | ORAL | 1 refills | Status: DC

## 2017-12-03 NOTE — Progress Notes (Signed)
   Subjective:    Patient ID: Nichole Cunningham, female    DOB: 1950/10/28, 67 y.o.   MRN: [id number]   Chief Complaint: right side pain (For 2  months  Also having sharp pains in head)   HPI Patient comes in c/o right flank pain for the last 2 months. Seems to be getting worse. Pain is intermittent and is worse when laying down at night. Rates pain 4/10 currently. Describes pain as aching most of the time but occasionally it is a sharp pain that just fades away. Denies any injury   Review of Systems  Constitutional: Negative for activity change and appetite change.  HENT: Negative.   Eyes: Negative for pain.  Respiratory: Negative for shortness of breath.   Cardiovascular: Negative for chest pain, palpitations and leg swelling.  Gastrointestinal: Negative for abdominal pain.  Endocrine: Negative for polydipsia.  Genitourinary: Negative.   Skin: Negative for rash.  Neurological: Negative for dizziness, weakness and headaches.  Hematological: Does not bruise/bleed easily.  Psychiatric/Behavioral: Negative.   All other systems reviewed and are negative.      Objective:   Physical Exam  Constitutional: She is oriented to person, place, and time. She appears well-developed and well-nourished. She appears distressed (mild).  Cardiovascular: Normal rate.  Pulmonary/Chest: Effort normal.  Mild ain on right lower rib cage on palpation.  Neurological: She is alert and oriented to person, place, and time.  Skin: Skin is warm.  Psychiatric: She has a normal mood and affect. Her behavior is normal. Thought content normal.   BP 139/89   Pulse 79   Temp (!) 97.2 F (36.2 C) (Oral)   Ht 5\' 2"  (1.575 m)   Wt 168 lb (76.2 kg)   BMI 30.73 kg/m      Assessment & Plan:  Nichole Cunningham in today with chief complaint of right side pain (For 2  months  Also having sharp pains in head)   1. Right flank pain Will try antiinflammatory and if does not help will order chest CT - DG Chest 2  View; Future - naproxen (NAPROSYN) 500 MG tablet; Take 1 tablet (500 mg total) by mouth 2 (two) times daily with a meal.  Dispense: 60 tablet; Refill: [place], FNP

## 2017-12-17 ENCOUNTER — Other Ambulatory Visit: Payer: Self-pay | Admitting: Family Medicine

## 2017-12-17 DIAGNOSIS — J029 Acute pharyngitis, unspecified: Secondary | ICD-10-CM

## 2017-12-30 DIAGNOSIS — R69 Illness, unspecified: Secondary | ICD-10-CM | POA: Diagnosis not present

## 2017-12-31 DIAGNOSIS — L821 Other seborrheic keratosis: Secondary | ICD-10-CM | POA: Diagnosis not present

## 2017-12-31 DIAGNOSIS — L728 Other follicular cysts of the skin and subcutaneous tissue: Secondary | ICD-10-CM | POA: Diagnosis not present

## 2017-12-31 DIAGNOSIS — L918 Other hypertrophic disorders of the skin: Secondary | ICD-10-CM | POA: Diagnosis not present

## 2018-01-17 ENCOUNTER — Ambulatory Visit (INDEPENDENT_AMBULATORY_CARE_PROVIDER_SITE_OTHER): Payer: Medicare HMO | Admitting: Nurse Practitioner

## 2018-01-17 ENCOUNTER — Encounter: Payer: Self-pay | Admitting: Nurse Practitioner

## 2018-01-17 VITALS — BP 135/84 | HR 100 | Temp 97.6°F | Ht 62.0 in | Wt 169.0 lb

## 2018-01-17 DIAGNOSIS — K591 Functional diarrhea: Secondary | ICD-10-CM

## 2018-01-17 MED ORDER — HYOSCYAMINE SULFATE 0.125 MG PO TABS: 0.1250 mg | ORAL_TABLET | ORAL | 0 refills | Status: DC | PRN

## 2018-01-17 NOTE — Progress Notes (Signed)
   Subjective:    Patient ID: Nichole Cunningham, female    DOB: 1950-06-26, 67 y.o.   MRN: [id number]   Chief Complaint: frequent stools (Since having gallbladder out)   HPI Patient comes in c/o of chronic diarrhea. Started after she had her galblader out about 2 years ago. Not long after eating she gets stomach cramps and has to go to the bathroom. Always diarrhea. She is not able  To hold it when she has to go. She has not tried anything for it. No family history of bowel problems.    Review of Systems  Constitutional: Negative.   HENT: Negative.   Respiratory: Negative.   Cardiovascular: Negative.   Gastrointestinal: Positive for abdominal pain (only when sh ehas to go to the restroom. ) and diarrhea. Negative for blood in stool.  Genitourinary: Negative.   Neurological: Negative.   Psychiatric/Behavioral: Negative.   All other systems reviewed and are negative.      Objective:   Physical Exam  Constitutional: She is oriented to person, place, and time. She appears well-developed and well-nourished. No distress.  Cardiovascular: Normal rate and regular rhythm.  Pulmonary/Chest: Effort normal and breath sounds normal.  Abdominal: Soft. Bowel sounds are normal. She exhibits no distension and no mass. There is no tenderness. There is no rebound and no guarding. No hernia.  Neurological: She is alert and oriented to person, place, and time.  Skin: Skin is warm.  Psychiatric: She has a normal mood and affect. Her behavior is normal. Thought content normal.   BP 135/84   Pulse 100   Temp 97.6 F (36.4 C) (Oral)   Ht 5\' 2"  (1.575 m)   Wt 169 lb (76.7 kg)   BMI 30.91 kg/m        Assessment & Plan:  Nichole Cunningham in today with chief complaint of frequent stools (Since having gallbladder out)   1. Functional diarrhea Imodium AD OTC as needed Probiotic daily Force fluids Meds ordered this encounter  Medications  . hyoscyamine (LEVSIN, ANASPAZ) 0.125 MG tablet    Sig:  Take 1 tablet (0.125 mg total) by mouth every 4 (four) hours as needed.    Dispense:  30 tablet    Refill:  0    Order Specific Question:   Supervising Provider    Answer:   Eustaquio Maize [[id number]]   Mary-Margaret Hassell Done, FNP

## 2018-01-17 NOTE — Patient Instructions (Signed)
Diarrhea, Adult °Diarrhea is when you have loose and water poop (stool) often. Diarrhea can make you feel weak and cause you to get dehydrated. Dehydration can make you tired and thirsty, make you have a dry mouth, and make it so you pee (urinate) less often. Diarrhea often lasts 2-3 days. However, it can last longer if it is a sign of something more serious. It is important to treat your diarrhea as told by your doctor. °Follow these instructions at home: °Eating and drinking ° °Follow these recommendations as told by your doctor: °· Take an oral rehydration solution (ORS). This is a drink that is sold at pharmacies and stores. °· Drink clear fluids, such as: °? Water. °? Ice chips. °? Diluted fruit juice. °? Low-calorie sports drinks. °· Eat bland, easy-to-digest foods in small amounts as you are able. These foods include: °? Bananas. °? Applesauce. °? Rice. °? Low-fat (lean) meats. °? Toast. °? Crackers. °· Avoid drinking fluids that have a lot of sugar or caffeine in them. °· Avoid alcohol. °· Avoid spicy or fatty foods. ° °General instructions ° °· Drink enough fluid to keep your pee (urine) clear or pale yellow. °· Wash your hands often. If you cannot use soap and water, use hand sanitizer. °· Make sure that all people in your home wash their hands well and often. °· Take over-the-counter and prescription medicines only as told by your doctor. °· Rest at home while you get better. °· Watch your condition for any changes. °· Take a warm bath to help with any burning or pain from having diarrhea. °· Keep all follow-up visits as told by your doctor. This is important. °Contact a doctor if: °· You have a fever. °· Your diarrhea gets worse. °· You have new symptoms. °· You cannot keep fluids down. °· You feel light-headed or dizzy. °· You have a headache. °· You have muscle cramps. °Get help right away if: °· You have chest pain. °· You feel very weak or you pass out (faint). °· You have bloody or black poop or  poop that look like tar. °· You have very bad pain, cramping, or bloating in your belly (abdomen). °· You have trouble breathing or you are breathing very quickly. °· Your heart is beating very quickly. °· Your skin feels cold and clammy. °· You feel confused. °· You have signs of dehydration, such as: °? Dark pee, hardly any pee, or no pee. °? Cracked lips. °? Dry mouth. °? Sunken eyes. °? Sleepiness. °? Weakness. °This information is not intended to replace advice given to you by your health care provider. Make sure you discuss any questions you have with your health care provider. °Document Released: 08/01/2007 Document Revised: 09/02/2015 Document Reviewed: 10/19/2014 °Elsevier Interactive Patient Education © 2018 Else[employer] Inc[employer] ° °

## 2018-02-04 DIAGNOSIS — R69 Illness, unspecified: Secondary | ICD-10-CM | POA: Diagnosis not present

## 2018-02-26 HISTORY — PX: SKIN LESION EXCISION: SHX2412

## 2018-02-27 ENCOUNTER — Other Ambulatory Visit: Payer: Self-pay | Admitting: Nurse Practitioner

## 2018-02-27 DIAGNOSIS — F5101 Primary insomnia: Secondary | ICD-10-CM

## 2018-02-27 NOTE — Telephone Encounter (Signed)
Last filled and seen 05/0/19

## 2018-03-04 DIAGNOSIS — C490 Malignant neoplasm of connective and soft tissue of head, face and neck: Secondary | ICD-10-CM | POA: Diagnosis not present

## 2018-03-04 DIAGNOSIS — L820 Inflamed seborrheic keratosis: Secondary | ICD-10-CM | POA: Diagnosis not present

## 2018-03-06 ENCOUNTER — Other Ambulatory Visit: Payer: Self-pay | Admitting: Nurse Practitioner

## 2018-03-06 DIAGNOSIS — E785 Hyperlipidemia, unspecified: Secondary | ICD-10-CM

## 2018-03-21 ENCOUNTER — Other Ambulatory Visit: Payer: Self-pay | Admitting: Nurse Practitioner

## 2018-03-21 DIAGNOSIS — E785 Hyperlipidemia, unspecified: Secondary | ICD-10-CM

## 2018-03-21 NOTE — Telephone Encounter (Signed)
Last lipid 06/28/17

## 2018-03-24 DIAGNOSIS — C490 Malignant neoplasm of connective and soft tissue of head, face and neck: Secondary | ICD-10-CM | POA: Diagnosis not present

## 2018-04-01 ENCOUNTER — Other Ambulatory Visit: Payer: Self-pay | Admitting: Nurse Practitioner

## 2018-04-01 DIAGNOSIS — E785 Hyperlipidemia, unspecified: Secondary | ICD-10-CM

## 2018-04-01 NOTE — Telephone Encounter (Signed)
MMM. NTBS 30 days given 03/06/18

## 2018-04-02 NOTE — Telephone Encounter (Signed)
appt made

## 2018-04-07 ENCOUNTER — Ambulatory Visit (INDEPENDENT_AMBULATORY_CARE_PROVIDER_SITE_OTHER): Payer: Medicare HMO | Admitting: Nurse Practitioner

## 2018-04-07 ENCOUNTER — Encounter: Payer: Self-pay | Admitting: Nurse Practitioner

## 2018-04-07 VITALS — BP 132/88 | HR 80 | Temp 97.6°F | Ht 62.0 in | Wt 169.0 lb

## 2018-04-07 DIAGNOSIS — Z23 Encounter for immunization: Secondary | ICD-10-CM

## 2018-04-07 DIAGNOSIS — Z6829 Body mass index (BMI) 29.0-29.9, adult: Secondary | ICD-10-CM | POA: Diagnosis not present

## 2018-04-07 DIAGNOSIS — E785 Hyperlipidemia, unspecified: Secondary | ICD-10-CM | POA: Diagnosis not present

## 2018-04-07 DIAGNOSIS — R69 Illness, unspecified: Secondary | ICD-10-CM | POA: Diagnosis not present

## 2018-04-07 DIAGNOSIS — F5101 Primary insomnia: Secondary | ICD-10-CM

## 2018-04-07 MED ORDER — ZOLPIDEM TARTRATE 10 MG PO TABS: 5.0000 mg | ORAL_TABLET | Freq: Every evening | ORAL | 5 refills | Status: DC | PRN

## 2018-04-07 MED ORDER — FENOFIBRATE 160 MG PO TABS: ORAL_TABLET | ORAL | 1 refills | Status: DC

## 2018-04-07 MED ORDER — ROSUVASTATIN CALCIUM 10 MG PO TABS: 10.0000 mg | ORAL_TABLET | Freq: Every day | ORAL | 1 refills | Status: DC

## 2018-04-07 NOTE — Addendum Note (Signed)
Addended by: Rolena Infante on: 04/07/2018 11:16 AM   Modules accepted: Orders

## 2018-04-07 NOTE — Progress Notes (Signed)
Subjective:    Patient ID: Nichole Cunningham, female    DOB: [birth year], 68 y.o.   MRN: [id number]   Chief Complaint: Medical Management of Chronic Issues   HPI:  1. Primary insomnia  Patient is on ambien at night. Says she sleeps well when she takes [place]  2. Hyperlipidemia with target LDL less than 100  Does not really watch diet and does very little exercise.  3. BMI 29.0-29.9,adult  Weight unchanged from previous visit    Outpatient Encounter Medications as of 04/07/2018  Medication Sig  . Cholecalciferol (VITAMIN D3 SUPER STRENGTH) 2000 UNITS TABS Take 1 tablet by mouth daily.  . fenofibrate 160 MG tablet TAKE 1 TABLET (160 MG TOTAL) DAILY BY MOUTH.  . Omega-3 Fatty Acids (OMEGA 3 PO) Take by mouth.  . rosuvastatin (CRESTOR) 10 MG tablet Take 1 tablet (10 mg total) by mouth daily. (Needs to be seen before next refill)  . zolpidem (AMBIEN) 10 MG tablet TAKE 0.5 TABLETS (5 MG TOTAL) BY MOUTH AT BEDTIME AS NEEDED.     New complaints: None today  Social history: Retired from [employer].  * had cancerous lesion removed from forehead- has follow up in 3 months  Review of Systems  Constitutional: Negative for activity change and appetite change.  HENT: Negative.   Eyes: Negative for pain.  Respiratory: Negative for shortness of breath.   Cardiovascular: Negative for chest pain, palpitations and leg swelling.  Gastrointestinal: Negative for abdominal pain.  Endocrine: Negative for polydipsia.  Genitourinary: Negative.   Skin: Negative for rash.  Neurological: Negative for dizziness, weakness and headaches.  Hematological: Does not bruise/bleed easily.  Psychiatric/Behavioral: Negative.   All other systems reviewed and are negative.      Objective:   Physical Exam Vitals signs and nursing note reviewed.  Constitutional:      General: She is not in acute distress.    Appearance: Normal appearance. She is well-developed.  HENT:     Head: Normocephalic.       Nose: Nose normal.  Eyes:     Pupils: Pupils are equal, round, and reactive to light.  Neck:     Musculoskeletal: Normal range of motion and neck supple.     Vascular: No carotid bruit or JVD.  Cardiovascular:     Rate and Rhythm: Normal rate and regular rhythm.     Heart sounds: Normal heart sounds.  Pulmonary:     Effort: Pulmonary effort is normal. No respiratory distress.     Breath sounds: Normal breath sounds. No wheezing or rales.  Chest:     Chest wall: No tenderness.  Abdominal:     General: Bowel sounds are normal. There is no distension or abdominal bruit.     Palpations: Abdomen is soft. There is no hepatomegaly, splenomegaly, mass or pulsatile mass.     Tenderness: There is no abdominal tenderness.  Musculoskeletal: Normal range of motion.  Lymphadenopathy:     Cervical: No cervical adenopathy.  Skin:    General: Skin is warm and dry.  Neurological:     Mental Status: She is alert and oriented to person, place, and time.     Deep Tendon Reflexes: Reflexes are normal and symmetric.  Psychiatric:        Behavior: Behavior normal.        Thought Content: Thought content normal.        Judgment: Judgment normal.    BP 132/88   Pulse 80   Temp 97.6 F (36.4  C) (Oral)   Ht '5\' 2"'$  (1.575 m)   Wt 169 lb (76.7 kg)   BMI 30.91 kg/m         Assessment & Plan:  Nichole Cunningham comes in today with chief complaint of Medical Management of Chronic Issues   Diagnosis and orders addressed:  1. Primary insomnia Bedtime routine - zolpidem (AMBIEN) 10 MG tablet; Take 0.5 tablets (5 mg total) by mouth at bedtime as needed.  Dispense: 30 tablet; Refill: 5  2. Hyperlipidemia with target LDL less than 100 Low fat diet - fenofibrate 160 MG tablet; TAKE 1 TABLET (160 MG TOTAL) DAILY BY MOUTH.  Dispense: 90 tablet; Refill: 1 - rosuvastatin (CRESTOR) 10 MG tablet; Take 1 tablet (10 mg total) by mouth daily. (Needs to be seen before next refill)  Dispense: 90 tablet;  Refill: 1 - CMP14+EGFR - Lipid panel  3. BMI 29.0-29.9,adult Discussed diet and exercise for person with BMI >25 Will recheck weight in 3-6 months    Labs pending Health Maintenance reviewed Diet and exercise encouraged  Follow up plan: 6 months   Mary-Margaret Hassell Done, FNP

## 2018-04-07 NOTE — Patient Instructions (Signed)

## 2018-04-08 LAB — CMP14+EGFR
ALT: 31 IU/L (ref 0–32)
AST: 27 IU/L (ref 0–40)
Albumin/Globulin Ratio: 2.1 (ref 1.2–2.2)
Albumin: 4.8 g/dL (ref 3.8–4.8)
Alkaline Phosphatase: 75 IU/L (ref 39–117)
BUN/Creatinine Ratio: 23 (ref 12–28)
BUN: 22 mg/dL (ref 8–27)
Bilirubin Total: 0.7 mg/dL (ref 0.0–1.2)
CO2: 22 mmol/L (ref 20–29)
Calcium: 9.9 mg/dL (ref 8.7–10.3)
Chloride: 106 mmol/L (ref 96–106)
Creatinine, Ser: 0.96 mg/dL (ref 0.57–1.00)
GFR calc non Af Amer: 61 mL/min/{1.73_m2} (ref >59)
GFR, EST AFRICAN AMERICAN: 71 mL/min/{1.73_m2} (ref >59)
Globulin, Total: 2.3 g/dL (ref 1.5–4.5)
Glucose: 101 mg/dL — ABNORMAL HIGH (ref 65–99)
Potassium: 4.6 mmol/L (ref 3.5–5.2)
SODIUM: 145 mmol/L — AB (ref 134–144)
Total Protein: 7.1 g/dL (ref 6.0–8.5)

## 2018-04-08 LAB — LIPID PANEL
CHOL/HDL RATIO: 3 ratio (ref 0.0–4.4)
Cholesterol, Total: 130 mg/dL (ref 100–199)
HDL: 43 mg/dL (ref >39)
LDL Calculated: 60 mg/dL (ref 0–99)
Triglycerides: 133 mg/dL (ref 0–149)
VLDL Cholesterol Cal: 27 mg/dL (ref 5–40)

## 2018-05-12 ENCOUNTER — Other Ambulatory Visit: Payer: Self-pay

## 2018-05-13 ENCOUNTER — Ambulatory Visit (INDEPENDENT_AMBULATORY_CARE_PROVIDER_SITE_OTHER): Payer: Medicare HMO | Admitting: Family Medicine

## 2018-05-13 ENCOUNTER — Encounter: Payer: Self-pay | Admitting: Family Medicine

## 2018-05-13 VITALS — BP 139/89 | HR 82 | Temp 98.2°F | Ht 62.0 in | Wt 167.8 lb

## 2018-05-13 DIAGNOSIS — R51 Headache: Secondary | ICD-10-CM | POA: Diagnosis not present

## 2018-05-13 DIAGNOSIS — R42 Dizziness and giddiness: Secondary | ICD-10-CM

## 2018-05-13 DIAGNOSIS — R519 Headache, unspecified: Secondary | ICD-10-CM

## 2018-05-13 NOTE — Patient Instructions (Addendum)
Headache supplements: - Magnesium citrate 400mg  daily - Riboflavin 400mg  daily (B2) - coenzyme Q10 100mg  three times daily  Maintain a headache diary.  Bring this to your next appointment Make sure you are getting plenty of water and rest.  We discussed consideration for neurology referral.  We should consider this especially if symptoms do not get better.  Dizziness Dizziness is a common problem. It makes you feel unsteady or light-headed. You may feel like you are about to pass out (faint). Dizziness can lead to getting hurt if you stumble or fall. Dizziness can be caused by many things, including:  Medicines.  Not having enough water in your body (dehydration).  Illness. Follow these instructions at home: Eating and drinking   Drink enough fluid to keep your pee (urine) clear or pale yellow. This helps to keep you from getting dehydrated. Try to drink more clear fluids, such as water.  Do not drink alcohol.  Limit how much caffeine you drink or eat, if your doctor tells you to do that.  Limit how much salt (sodium) you drink or eat, if your doctor tells you to do that. Activity   Avoid making quick movements. ? When you stand up from sitting in a chair, steady yourself until you feel okay. ? In the morning, first sit up on the side of the bed. When you feel okay, stand slowly while you hold onto something. Do this until you know that your balance is fine.  If you need to stand in one place for a long time, move your legs often. Tighten and relax the muscles in your legs while you are standing.  Do not drive or use heavy machinery if you feel dizzy.  Avoid bending down if you feel dizzy. Place items in your home so you can reach them easily without leaning over. Lifestyle  Do not use any products that contain nicotine or tobacco, such as cigarettes and e-cigarettes. If you need help quitting, ask your doctor.  Try to lower your stress level. You can do this by using  methods such as yoga or meditation. Talk with your doctor if you need help. General instructions  Watch your dizziness for any changes.  Take over-the-counter and prescription medicines only as told by your doctor. Talk with your doctor if you think that you are dizzy because of a medicine that you are taking.  Tell a friend or a family member that you are feeling dizzy. If he or she notices any changes in your behavior, have this person call your doctor.  Keep all follow-up visits as told by your doctor. This is important. Contact a doctor if:  Your dizziness does not go away.  Your dizziness or light-headedness gets worse.  You feel sick to your stomach (nauseous).  You have trouble hearing.  You have new symptoms.  You are unsteady on your feet.  You feel like the room is spinning. Get help right away if:  You throw up (vomit) or have watery poop (diarrhea), and you cannot eat or drink anything.  You have trouble: ? Talking. ? Walking. ? Swallowing. ? Using your arms, hands, or legs.  You feel generally weak.  You are not thinking clearly, or you have trouble forming sentences. A friend or family member may notice this.  You have: ? Chest pain. ? Pain in your belly (abdomen). ? Shortness of breath. ? Sweating.  Your vision changes.  You are bleeding.  You have a very bad headache.  You have neck pain or a stiff neck.  You have a fever. These symptoms may be an emergency. Do not wait to see if the symptoms will go away. Get medical help right away. Call your local emergency services (911 in the [place] Do not drive yourself to the hospital. Summary  Dizziness makes you feel unsteady or light-headed. You may feel like you are about to pass out (faint).  Drink enough fluid to keep your pee (urine) clear or pale yellow. Do not drink alcohol.  Avoid making quick movements if you feel dizzy.  Watch your dizziness for any changes. This information is not  intended to replace advice given to you by your health care provider. Make sure you discuss any questions you have with your health care provider. Document Released: 02/01/2011 Document Revised: 03/01/2016 Document Reviewed: 03/01/2016 Elsevier Interactive Patient Education  2019 [place] Headache Without Cause A headache is pain or discomfort that is felt around the head or neck area. There are many causes and types of headaches. In some cases, the cause may not be found. Follow these instructions at home: Watch your condition for any changes. Let your doctor know about them. Take these steps to help with your condition: Managing pain      Take over-the-counter and prescription medicines only as told by your doctor.  Lie down in a dark, quiet room when you have a headache.  If told, put ice on your head and neck area: ? Put ice in a plastic bag. ? Place a towel between your skin and the bag. ? Leave the ice on for 20 minutes, 2-3 times per day.  If told, put heat on the affected area. Use the heat source that your doctor recommends, such as a moist heat pack or a heating pad. ? Place a towel between your skin and the heat source. ? Leave the heat on for 20-30 minutes. ? Remove the heat if your skin turns bright red. This is very important if you are unable to feel pain, heat, or cold. You may have a greater risk of getting burned.  Keep lights dim if bright lights bother you or make your headaches worse. Eating and drinking  Eat meals on a regular schedule.  If you drink alcohol: ? Limit how much you use to:  0-1 drink a day for women.  0-2 drinks a day for men. ? Be aware of how much alcohol is in your drink. In the [place] one drink equals one 12 oz bottle of beer (355 mL), one 5 oz glass of wine (148 mL), or one 1 oz glass of hard liquor (44 mL).  Stop drinking caffeine, or reduce how much caffeine you drink. General instructions   Keep a journal to find  out if certain things bring on headaches. For example, write down: ? What you eat and drink. ? How much sleep you get. ? Any change to your diet or medicines.  Get a massage or try other ways to relax.  Limit stress.  Sit up straight. Do not tighten (tense) your muscles.  Do not use any products that contain nicotine or tobacco. This includes cigarettes, e-cigarettes, and chewing tobacco. If you need help quitting, ask your doctor.  Exercise regularly as told by your doctor.  Get enough sleep. This often means 7-9 hours of sleep each night.  Keep all follow-up visits as told by your doctor. This is important. Contact a doctor if:  Your symptoms are not  helped by medicine.  You have a headache that feels different than the other headaches.  You feel sick to your stomach (nauseous) or you throw up (vomit).  You have a fever. Get help right away if:  Your headache gets very bad quickly.  Your headache gets worse after a lot of physical activity.  You keep throwing up.  You have a stiff neck.  You have trouble seeing.  You have trouble speaking.  You have pain in the eye or ear.  Your muscles are weak or you lose muscle control.  You lose your balance or have trouble walking.  You feel like you will pass out (faint) or you pass out.  You are mixed up (confused).  You have a seizure. Summary  A headache is pain or discomfort that is felt around the head or neck area.  There are many causes and types of headaches. In some cases, the cause may not be found.  Keep a journal to help find out what causes your headaches. Watch your condition for any changes. Let your doctor know about them.  Contact a doctor if you have a headache that is different from usual, or if your headache is not helped by medicine.  Get help right away if your headache gets very bad, you throw up, you have trouble seeing, you lose your balance, or you have a seizure. This information is not  intended to replace advice given to you by your health care provider. Make sure you discuss any questions you have with your health care provider. Document Released: 11/22/2007 Document Revised: 09/02/2017 Document Reviewed: 09/02/2017 Elsevier Interactive Patient Education  2019 [employer].

## 2018-05-13 NOTE — Progress Notes (Signed)
Subjective: CC: dizziness PCP: [name], DO Nichole Cunningham is a 68 y.o. female presenting to clinic today for:  1. Dizziness Patient reports onset of headache and associated dizziness before Christmas.  Both symptoms seem to be intermittent but occurring several times per week.  Her headache is typically mild and a dull ache.  She has taken Tylenol multiple times per day with some relief but no total resolution.  It continues to recur at least 4 to 5 days/week.  She occasionally has migraine headaches but states that this is less than 1 time per month.  During the migraine she does have some photophobia, phonophobia and nausea.  During her mild headache days she does not have these symptoms.  She does report adequate rest when she uses the Ambien, which is rare.  She does not feel that this changes the characteristics of her headache.  She reports good hydration.  She is a non-smoker.  She has had no neurologic changes including visual disturbance, unilateral weakness or numbness and tingling.  No changes in speech.  She has never seen a neurologist for her migraine headaches in the past.   ROS: Per HPI  No Known Allergies Past Medical History:  Diagnosis Date  . Complication of anesthesia   . Hyperlipidemia   . PONV (postoperative nausea and vomiting)     Current Outpatient Medications:  .  Cholecalciferol (VITAMIN D3 SUPER STRENGTH) 2000 UNITS TABS, Take 1 tablet by mouth daily., Disp: , Rfl:  .  fenofibrate 160 MG tablet, TAKE 1 TABLET (160 MG TOTAL) DAILY BY MOUTH., Disp: 90 tablet, Rfl: 1 .  Omega-3 Fatty Acids (OMEGA 3 PO), Take by mouth., Disp: , Rfl:  .  rosuvastatin (CRESTOR) 10 MG tablet, Take 1 tablet (10 mg total) by mouth daily. (Needs to be seen before next refill), Disp: 90 tablet, Rfl: 1 .  zolpidem (AMBIEN) 10 MG tablet, Take 0.5 tablets (5 mg total) by mouth at bedtime as needed., Disp: 30 tablet, Rfl: 5 Social History   Socioeconomic History  .  Marital status: Married    Spouse name: Not on file  . Number of children: Not on file  . Years of education: Not on file  . Highest education level: Not on file  Occupational History  . Not on file  Social Needs  . Financial resource strain: Not on file  . Food insecurity:    Worry: Not on file    Inability: Not on file  . Transportation needs:    Medical: Not on file    Non-medical: Not on file  Tobacco Use  . Smoking status: Never Smoker  . Smokeless tobacco: Never Used  Substance and Sexual Activity  . Alcohol use: No  . Drug use: No  . Sexual activity: Not on file  Lifestyle  . Physical activity:    Days per week: Not on file    Minutes per session: Not on file  . Stress: Not on file  Relationships  . Social connections:    Talks on phone: Not on file    Gets together: Not on file    Attends religious service: Not on file    Active member of club or organization: Not on file    Attends meetings of clubs or organizations: Not on file    Relationship status: Not on file  . Intimate partner violence:    Fear of current or ex partner: Not on file    Emotionally abused: Not on file  Physically abused: Not on file    Forced sexual activity: Not on file  Other Topics Concern  . Not on file  Social History Narrative  . Not on file   Family History  Problem Relation Age of Onset  . Breast cancer Maternal Aunt   . Anesthesia problems Neg Hx   . Hypotension Neg Hx   . Malignant hyperthermia Neg Hx   . Pseudochol deficiency Neg Hx   . Colon cancer Neg Hx   . Stomach cancer Neg Hx     Objective: Office vital signs reviewed. BP 139/89   Pulse 82   Temp 98.2 F (36.8 C) (Oral)   Ht 5\' 2"  (1.575 m)   Wt 167 lb 12.8 oz (76.1 kg)   BMI 30.69 kg/m   Physical Examination:  General: Awake, alert, well nourished, No acute distress HEENT: Normal; sclera white.  No nasal drainage.  PERRLA/EOMI/symmetric rise of palate. MSK: Normal gait and station Neuro: Cranial  nerves II through XII grossly intact.  Alert and oriented x3.  Cerebellar testing normal in the upper extremities.  She has 5 out of 5 strength in the upper and lower extremities.  Light touch and station grossly intact.  No nystagmus.  Orthostatic VS for the past 24 hrs:  BP- Lying Pulse- Lying BP- Sitting Pulse- Sitting BP- Standing at 0 minutes Pulse- Standing at 0 minutes  05/13/18 1540 133/85 77 125/83 79 126/83 80     Assessment/ Plan: 68 y.o. female   1. Chronic daily headache Possible rebound headache from chronic use of analgesics.  Her neurologic exam was nonfocal today.  I do question a possible chronic sinusitis but she denied any sinus symptoms.  For now, will treat with supplements, maintain headache diary, encourage hydration and adequate rest.  I have advised use of magnesium, vitamin B2 and coenzyme Q10.  She will contact me if she is unable to follow-up in 1 month via telephone and we will plan accordingly.  We did discuss consideration for beta-blocker in efforts to reduce daily headache/migraines.  I also discussed consideration for referral to neurology for further evaluation but she declined this today.  2. Dizziness Orthostatic vital signs within normal limits.  Dizziness is intermittent and nonsustained.  It does not appear to be vertiginous in nature.  It is certainly possible that she has an inner ear dysfunction.  She has known hearing loss secondary to her workplace.  We discussed consideration for oral antihistamine to see if perhaps this might improve symptoms.  We discussed reasons for return and emergent evaluation emergency department.   No orders of the defined types were placed in this encounter.  No orders of the defined types were placed in this encounter.    [name], DO [place] [phone]

## 2018-09-19 ENCOUNTER — Other Ambulatory Visit: Payer: Self-pay | Admitting: Nurse Practitioner

## 2018-09-19 DIAGNOSIS — E785 Hyperlipidemia, unspecified: Secondary | ICD-10-CM

## 2018-09-19 NOTE — Telephone Encounter (Signed)
Ov 10/20/18

## 2018-09-30 ENCOUNTER — Ambulatory Visit (INDEPENDENT_AMBULATORY_CARE_PROVIDER_SITE_OTHER): Payer: Medicare HMO | Admitting: *Deleted

## 2018-09-30 ENCOUNTER — Encounter: Payer: Self-pay | Admitting: *Deleted

## 2018-09-30 DIAGNOSIS — Z0000 Encounter for general adult medical examination without abnormal findings: Secondary | ICD-10-CM | POA: Diagnosis not present

## 2018-09-30 NOTE — Progress Notes (Signed)
MEDICARE ANNUAL WELLNESS VISIT  09/30/2018  Telephone Visit Disclaimer This Medicare AWV was conducted by telephone due to national recommendations for restrictions regarding the COVID-19 Pandemic (e.g. social distancing).  I verified, using two identifiers, that I am speaking with Nichole Cunningham or their authorized healthcare agent. I discussed the limitations, risks, security, and privacy concerns of performing an evaluation and management service by telephone and the potential availability of an in-person appointment in the future. The patient expressed understanding and agreed to proceed.   Subjective:  Nichole Cunningham is a 68 y.o. female patient of [name], DO who had a Medicare Annual Wellness Visit today via telephone. Nichole Cunningham is Retired but she does keep her 60 month old grandchild during the day and lives with their spouse. she has 2 children. she reports that she is socially active and does interact with friends/family regularly. she is minimally physically active and enjoys shopping and looking at antique shops.  Patient Care Team: [name], DO as PCP - General (Family Medicine) Vania Rea, MD as Attending Physician (Obstetrics and Gynecology)  Advanced Directives 09/30/2018 04/11/2011  Does Patient Have a Medical Advance Directive? No Patient does not have advance directive;Patient would not like information  Would patient like information on creating a medical advance directive? No - Patient declined -  Pre-existing out of facility DNR order (yellow form or pink MOST form) - No    Hospital Utilization Over the Past 12 Months: # of hospitalizations or ER visits: 0 # of surgeries: 1  Review of Systems    Patient reports that her overall health is unchanged compared to last year.  Patient Reported Readings (BP, Pulse, CBG, Weight, etc) none  Review of Systems: No complaints  All other systems negative.  Pain Assessment Pain : No/denies pain     Current Medications & Allergies (verified) Allergies as of 09/30/2018   No Known Allergies     Medication List       Accurate as of September 30, 2018  8:51 AM. If you have any questions, ask your nurse or doctor.        fenofibrate 160 MG tablet TAKE 1 TABLET (160 MG TOTAL) DAILY BY MOUTH.   OMEGA 3 PO Take by mouth.   rosuvastatin 10 MG tablet Commonly known as: CRESTOR Take 1 tablet (10 mg total) by mouth daily.   Vitamin D3 Super Strength 50 MCG (2000 UT) Tabs Generic drug: Cholecalciferol Take 1 tablet by mouth daily.   vitamin E 400 UNIT capsule Take 400 Units by mouth daily.   zolpidem 10 MG tablet Commonly known as: AMBIEN Take 0.5 tablets (5 mg total) by mouth at bedtime as needed.       History (reviewed): Past Medical History:  Diagnosis Date  . Complication of anesthesia   . Hyperlipidemia   . PONV (postoperative nausea and vomiting)    Past Surgical History:  Procedure Laterality Date  . CHOLECYSTECTOMY  04/13/2011   Procedure: LAPAROSCOPIC CHOLECYSTECTOMY;  Surgeon: Donato Heinz, MD;  Location: [facility];  Service: General;  Laterality: N/A;  . HEMORRHOID SURGERY  2003   [place]  . SKIN LESION EXCISION  02/2018   mole removed from forehead    Family History  Problem Relation Age of Onset  . Breast cancer Maternal Aunt   . Anesthesia problems Neg Hx   . Hypotension Neg Hx   . Malignant hyperthermia Neg Hx   . Pseudochol deficiency Neg Hx   .  Colon cancer Neg Hx   . Stomach cancer Neg Hx    Social History   Socioeconomic History  . Marital status: Married    Spouse name: [name]  . Number of children: 2  . Years of education: 27  . Highest education level: High school graduate  Occupational History  . Occupation: retired  Scientific laboratory technician  . Financial resource strain: Not hard at all  . Food insecurity    Worry: Never true    Inability: Never true  . Transportation needs    Medical: No    Non-medical: No  Tobacco Use  . Smoking  status: Never Smoker  . Smokeless tobacco: Never Used  Substance and Sexual Activity  . Alcohol use: No  . Drug use: No  . Sexual activity: Yes    Birth control/protection: Post-menopausal  Lifestyle  . Physical activity    Days per week: 0 days    Minutes per session: 0 min  . Stress: Not at all  Relationships  . Social connections    Talks on phone: More than three times a week    Gets together: More than three times a week    Attends religious service: More than 4 times per year    Active member of club or organization: Yes    Attends meetings of clubs or organizations: More than 4 times per year    Relationship status: Married  Other Topics Concern  . Not on file  Social History Narrative  . Not on file    Activities of Daily Living In your present state of health, do you have any difficulty performing the following activities: 09/30/2018  Hearing? N  Vision? N  Comment pt does get yearly eye exams  Difficulty concentrating or making decisions? N  Walking or climbing stairs? N  Dressing or bathing? N  Doing errands, shopping? N  Preparing Food and eating ? N  Using the Toilet? N  In the past six months, have you accidently leaked urine? Y  Comment with coughing, sneezing or trying to pick heavy objects up  Do you have problems with loss of bowel control? N  Managing your Medications? N  Managing your Finances? N  Housekeeping or managing your Housekeeping? N  Some recent data might be hidden    Patient Education/ Literacy How often do you need to have someone help you when you read instructions, pamphlets, or other written materials from your doctor or pharmacy?: 1 - Never What is the last grade level you completed in school?: 12th grade  Exercise Current Exercise Habits: The patient does not participate in regular exercise at present, Exercise limited by: None identified  Diet Patient reports consuming 2 meals a day and 2 snack(s) a day Patient reports that  her primary diet is: Regular Patient reports that she does have regular access to food.   Depression Screen PHQ 2/9 Scores 09/30/2018 05/13/2018 04/07/2018 01/17/2018 12/03/2017 06/28/2017 01/07/2017  PHQ - 2 Score 0 0 0 0 0 0 0  PHQ- 9 Score - 0 - - - - -     Fall Risk Fall Risk  09/30/2018 05/13/2018 04/07/2018 01/17/2018 12/03/2017  Falls in the past year? 0 0 0 0 No     Objective:  Nichole Cunningham seemed alert and oriented and she participated appropriately during our telephone visit.  Blood Pressure Weight BMI  BP Readings from Last 3 Encounters:  05/13/18 139/89  04/07/18 132/88  01/17/18 135/84   Wt Readings from Last 3  Encounters:  05/13/18 167 lb 12.8 oz (76.1 kg)  04/07/18 169 lb (76.7 kg)  01/17/18 169 lb (76.7 kg)   BMI Readings from Last 1 Encounters:  05/13/18 30.69 kg/m    *Unable to obtain current vital signs, weight, and BMI due to telephone visit type  Hearing/Vision  . Denise did not seem to have difficulty with hearing/understanding during the telephone conversation . Reports that she has had a formal eye exam by an eye care professional within the past year . Reports that she has not had a formal hearing evaluation within the past year *Unable to fully assess hearing and vision during telephone visit type  Cognitive Function: 6CIT Screen 09/30/2018  What Year? 0 points  What month? 0 points  What time? 0 points  Count back from 20 0 points  Months in reverse 4 points  Repeat phrase 2 points  Total Score 6   (Normal:0-7, Significant for Dysfunction: >8)  Normal Cognitive Function Screening: Yes   Immunization & Health Maintenance Record Immunization History  Administered Date(s) Administered  . Pneumococcal Conjugate-13 01/07/2017  . Pneumococcal Polysaccharide-23 04/07/2018  . Td 05/30/1999    Health Maintenance  Topic Date Due  . TETANUS/TDAP  05/29/2009  . INFLUENZA VACCINE  09/27/2018  . MAMMOGRAM  09/25/2019  . DEXA SCAN  10/15/2019  .  COLONOSCOPY  03/14/2022  . Hepatitis C Screening  Completed  . PNA vac Low Risk Adult  Completed       Assessment  This is a routine wellness examination for Nichole Cunningham.  Health Maintenance: Due or Overdue Health Maintenance Due  Topic Date Due  . TETANUS/TDAP  05/29/2009  . INFLUENZA VACCINE  09/27/2018    Nichole Cunningham does not need a referral for Community Assistance: Care Management:   no Social Work:    no Prescription Assistance:  no Nutrition/Diabetes Education:  no   Plan:  Personalized Goals Goals Addressed            This Visit's Progress   . DIET - INCREASE WATER INTAKE       Try to drink 6-8 glasses of water daily.      Personalized Health Maintenance & Screening Recommendations  Td vaccine Shingles vaccine  Lung Cancer Screening Recommended: no (Low Dose CT Chest recommended if Age 29-80 years, 30 pack-year currently smoking OR have quit w/in past 15 years) Hepatitis C Screening recommended: no HIV Screening recommended: no  Advanced Directives: Written information was not prepared per patient's request.  Referrals & Orders No orders of the defined types were placed in this encounter.   Follow-up Plan . Follow-up with [name], DO as planned . Consider TDAP and Shingles vaccines at your next visit with your PCP   I have personally reviewed and noted the following in the patient's chart:   . Medical and social history . Use of alcohol, tobacco or illicit drugs  . Current medications and supplements . Functional ability and status . Nutritional status . Physical activity . Advanced directives . List of other physicians . Hospitalizations, surgeries, and ER visits in previous 12 months . Vitals . Screenings to include cognitive, depression, and falls . Referrals and appointments  In addition, I have reviewed and discussed with Nichole Cunningham certain preventive protocols, quality metrics, and best practice recommendations.  A written personalized care plan for preventive services as well as general preventive health recommendations is available and can be mailed to the patient at her request.  Marylin Crosby, LPN  07/02/[id number]

## 2018-09-30 NOTE — Patient Instructions (Signed)
Preventive Care 68 Years and Older, Female Preventive care refers to lifestyle choices and visits with your health care provider that can promote health and wellness. This includes:  A yearly physical exam. This is also called an annual well check.  Regular dental and eye exams.  Immunizations.  Screening for certain conditions.  Healthy lifestyle choices, such as diet and exercise. What can I expect for my preventive care visit? Physical exam Your health care provider will check:  Height and weight. These may be used to calculate body mass index (BMI), which is a measurement that tells if you are at a healthy weight.  Heart rate and blood pressure.  Your skin for abnormal spots. Counseling Your health care provider may ask you questions about:  Alcohol, tobacco, and drug use.  Emotional well-being.  Home and relationship well-being.  Sexual activity.  Eating habits.  History of falls.  Memory and ability to understand (cognition).  Work and work Statistician.  Pregnancy and menstrual history. What immunizations do I need?  Influenza (flu) vaccine  This is recommended every year. Tetanus, diphtheria, and pertussis (Tdap) vaccine  You may need a Td booster every 10 years. Varicella (chickenpox) vaccine  You may need this vaccine if you have not already been vaccinated. Zoster (shingles) vaccine  You may need this after age 68. Pneumococcal conjugate (PCV13) vaccine  One dose is recommended after age 68. Pneumococcal polysaccharide (PPSV23) vaccine  One dose is recommended after age 68. Measles, mumps, and rubella (MMR) vaccine  You may need at least one dose of MMR if you were born in 1957 or later. You may also need a second dose. Meningococcal conjugate (MenACWY) vaccine  You may need this if you have certain conditions. Hepatitis A vaccine  You may need this if you have certain conditions or if you travel or work in places where you may be exposed  to hepatitis A. Hepatitis B vaccine  You may need this if you have certain conditions or if you travel or work in places where you may be exposed to hepatitis B. Haemophilus influenzae type b (Hib) vaccine  You may need this if you have certain conditions. You may receive vaccines as individual doses or as more than one vaccine together in one shot (combination vaccines). Talk with your health care provider about the risks and benefits of combination vaccines. What tests do I need? Blood tests  Lipid and cholesterol levels. These may be checked every 5 years, or more frequently depending on your overall health.  Hepatitis C test.  Hepatitis B test. Screening  Lung cancer screening. You may have this screening every year starting at age 68 if you have a 30-pack-year history of smoking and currently smoke or have quit within the past 15 years.  Colorectal cancer screening. All adults should have this screening starting at age 68 and continuing until age 68. Your health care provider may recommend screening at age 23 if you are at increased risk. You will have tests every 1-10 years, depending on your results and the type of screening test.  Diabetes screening. This is done by checking your blood sugar (glucose) after you have not eaten for a while (fasting). You may have this done every 1-3 years.  Mammogram. This may be done every 1-2 years. Talk with your health care provider about how often you should have regular mammograms.  BRCA-related cancer screening. This may be done if you have a family history of breast, ovarian, tubal, or peritoneal cancers.  Other tests  Sexually transmitted disease (STD) testing.  Bone density scan. This is done to screen for osteoporosis. You may have this done starting at age 68. Follow these instructions at home: Eating and drinking  Eat a diet that includes fresh fruits and vegetables, whole grains, lean protein, and low-fat dairy products. Limit  your intake of foods with high amounts of sugar, saturated fats, and salt.  Take vitamin and mineral supplements as recommended by your health care provider.  Do not drink alcohol if your health care provider tells you not to drink.  If you drink alcohol: ? Limit how much you have to 0-1 drink a day. ? Be aware of how much alcohol is in your drink. In the [place] one drink equals one 12 oz bottle of beer (355 mL), one 5 oz glass of wine (148 mL), or one 1 oz glass of hard liquor (44 mL). Lifestyle  Take daily care of your teeth and gums.  Stay active. Exercise for at least 30 minutes on 5 or more days each week.  Do not use any products that contain nicotine or tobacco, such as cigarettes, e-cigarettes, and chewing tobacco. If you need help quitting, ask your health care provider.  If you are sexually active, practice safe sex. Use a condom or other form of protection in order to prevent STIs (sexually transmitted infections).  Talk with your health care provider about taking a low-dose aspirin or statin. What's next?  Go to your health care provider once a year for a well check visit.  Ask your health care provider how often you should have your eyes and teeth checked.  Stay up to date on all vaccines. This information is not intended to replace advice given to you by your health care provider. Make sure you discuss any questions you have with your health care provider. Document Released: 03/11/2015 Document Revised: 02/06/2018 Document Reviewed: 02/06/2018 Elsevier Patient Education  2020 [employer].

## 2018-10-15 DIAGNOSIS — Z0100 Encounter for examination of eyes and vision without abnormal findings: Secondary | ICD-10-CM | POA: Diagnosis not present

## 2018-10-15 DIAGNOSIS — H5200 Hypermetropia, unspecified eye: Secondary | ICD-10-CM | POA: Diagnosis not present

## 2018-10-15 DIAGNOSIS — H25813 Combined forms of age-related cataract, bilateral: Secondary | ICD-10-CM | POA: Diagnosis not present

## 2018-10-15 DIAGNOSIS — E7800 Pure hypercholesterolemia, unspecified: Secondary | ICD-10-CM | POA: Diagnosis not present

## 2018-10-17 ENCOUNTER — Other Ambulatory Visit: Payer: Self-pay

## 2018-10-17 NOTE — Patient Instructions (Signed)
Controlled Substance Guidelines:  1. You cannot get an early refill, even it is lost.  2. You cannot get controlled medications from any other doctor, unless it is the emergency department and related to a new problem or injury.  3. You cannot use alcohol, marijuana, cocaine or any other recreational drugs while using this medication. This is very dangerous.  4. You are willing to have your urine drug tested at each visit.  5. You will not drive while using this medication, because that can put yourself and others in serious danger of an accident. 6. If any medication is stolen, then there must be a police report to verify it, or it cannot be refilled.  7. I will not prescribe these medications for longer than 3 months.  8. You must bring your pill bottle to each visit.  9. You must use the same pharmacy for all refills for the medication, unless you clear it with me beforehand.  10. You cannot share or sell this medication.   

## 2018-10-20 ENCOUNTER — Ambulatory Visit (INDEPENDENT_AMBULATORY_CARE_PROVIDER_SITE_OTHER): Payer: Medicare HMO | Admitting: Family Medicine

## 2018-10-20 ENCOUNTER — Encounter: Payer: Self-pay | Admitting: Family Medicine

## 2018-10-20 ENCOUNTER — Other Ambulatory Visit: Payer: Self-pay

## 2018-10-20 VITALS — BP 129/85 | HR 85 | Temp 97.9°F | Ht 62.0 in | Wt 167.0 lb

## 2018-10-20 DIAGNOSIS — F5101 Primary insomnia: Secondary | ICD-10-CM

## 2018-10-20 DIAGNOSIS — E785 Hyperlipidemia, unspecified: Secondary | ICD-10-CM | POA: Diagnosis not present

## 2018-10-20 DIAGNOSIS — R3989 Other symptoms and signs involving the genitourinary system: Secondary | ICD-10-CM | POA: Diagnosis not present

## 2018-10-20 DIAGNOSIS — Z79899 Other long term (current) drug therapy: Secondary | ICD-10-CM

## 2018-10-20 DIAGNOSIS — R69 Illness, unspecified: Secondary | ICD-10-CM | POA: Diagnosis not present

## 2018-10-20 LAB — URINALYSIS
Bilirubin, UA: NEGATIVE
Glucose, UA: NEGATIVE
Ketones, UA: NEGATIVE
Leukocytes,UA: NEGATIVE
Nitrite, UA: NEGATIVE
Protein,UA: NEGATIVE
RBC, UA: NEGATIVE
Specific Gravity, UA: >1.03 — ABNORMAL HIGH (ref 1.005–1.030)
Urobilinogen, Ur: 0.2 mg/dL (ref 0.2–1.0)
pH, UA: 5 (ref 5.0–7.5)

## 2018-10-20 MED ORDER — ROSUVASTATIN CALCIUM 10 MG PO TABS: 10.0000 mg | ORAL_TABLET | Freq: Every day | ORAL | 3 refills | Status: DC

## 2018-10-20 MED ORDER — FENOFIBRATE 160 MG PO TABS: ORAL_TABLET | ORAL | 3 refills | Status: DC

## 2018-10-20 MED ORDER — ZOLPIDEM TARTRATE 10 MG PO TABS: 5.0000 mg | ORAL_TABLET | Freq: Every evening | ORAL | 5 refills | Status: DC | PRN

## 2018-10-20 NOTE — Progress Notes (Signed)
Subjective: CC: est care, insomnia PCP: [name], DO WPY:[id number] Nichole Cunningham is a 68 y.o. female presenting to clinic today for:  1. Insomnia Patient reports that she does well on the Ambien.  She will take anywhere between 1/2 tablet and 1 full tablet at bedtime perhaps 2 times per week.  Sometimes she will go full week without needing it at all.  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  She denies any excessive daytime sleepiness, falls, dizziness.  2.  Hyperlipidemia Patient reports compliance with Crestor and fenofibrate.  No abdominal pain, skin discoloration.  No myalgia.  3.  Bladder pain Patient reports several week history of intermittent bladder pain.  This seems to occur when she goes to urinate.  Denies overt dysuria, hematuria or change in urinary frequency.  ROS: Per HPI  No Known Allergies Past Medical History:  Diagnosis Date  . Complication of anesthesia   . Hyperlipidemia   . PONV (postoperative nausea and vomiting)     Current Outpatient Medications:  .  Cholecalciferol (VITAMIN D3 SUPER STRENGTH) 2000 UNITS TABS, Take 1 tablet by mouth daily., Disp: , Rfl:  .  fenofibrate 160 MG tablet, TAKE 1 TABLET (160 MG TOTAL) DAILY BY MOUTH., Disp: 90 tablet, Rfl: 1 .  Omega-3 Fatty Acids (OMEGA 3 PO), Take by mouth., Disp: , Rfl:  .  rosuvastatin (CRESTOR) 10 MG tablet, Take 1 tablet (10 mg total) by mouth daily., Disp: 90 tablet, Rfl: 0 .  vitamin E 400 UNIT capsule, Take 400 Units by mouth daily., Disp: , Rfl:  .  zolpidem (AMBIEN) 10 MG tablet, Take 0.5 tablets (5 mg total) by mouth at bedtime as needed., Disp: 30 tablet, Rfl: 5 Social History   Socioeconomic History  . Marital status: Married    Spouse name: [name]  . Number of children: 2  . Years of education: 50  . Highest education level: High school graduate  Occupational History  . Occupation: retired  Scientific laboratory technician  . Financial resource strain: Not  hard at all  . Food insecurity    Worry: Never true    Inability: Never true  . Transportation needs    Medical: No    Non-medical: No  Tobacco Use  . Smoking status: Never Smoker  . Smokeless tobacco: Never Used  Substance and Sexual Activity  . Alcohol use: No  . Drug use: No  . Sexual activity: Yes    Birth control/protection: Post-menopausal  Lifestyle  . Physical activity    Days per week: 0 days    Minutes per session: 0 min  . Stress: Not at all  Relationships  . Social connections    Talks on phone: More than three times a week    Gets together: More than three times a week    Attends religious service: More than 4 times per year    Active member of club or organization: Yes    Attends meetings of clubs or organizations: More than 4 times per year    Relationship status: Married  . Intimate partner violence    Fear of current or ex partner: No    Emotionally abused: No    Physically abused: No    Forced sexual activity: No  Other Topics Concern  . Not on file  Social History Narrative  . Not on file   Family History  Problem Relation Age of Onset  . Breast cancer Maternal Aunt   .  Cancer Mother   . Heart disease Father   . Anesthesia problems Neg Hx   . Hypotension Neg Hx   . Malignant hyperthermia Neg Hx   . Pseudochol deficiency Neg Hx   . Colon cancer Neg Hx   . Stomach cancer Neg Hx     Objective: Office vital signs reviewed. BP 129/85   Pulse 85   Temp 97.9 F (36.6 C) (Temporal)   Ht _0  (1.575 m)   Wt 167 lb (75.8 kg)   BMI 30.54 kg/m   Physical Examination:  General: Awake, alert, well nourished, No acute distress HEENT: Normal, sclera white, no carotid bruits Cardio: regular rate and rhythm, S1S2 heard, no murmurs appreciated Pulm: clear to auscultation bilaterally, no wheezes, rhonchi or rales; normal work of breathing on room air Psych: Mood stable, speech normal, affect appropriate.  Assessment/ Plan: 68 y.o. female   1.  Hyperlipidemia with target LDL less than 100 Check liver function tests - rosuvastatin (CRESTOR) 10 MG tablet; Take 1 tablet (10 mg total) by mouth daily.  Dispense: 90 tablet; Refill: 3 - fenofibrate 160 MG tablet; TAKE 1 TABLET (160 MG TOTAL) DAILY BY MOUTH.  Dispense: 90 tablet; Refill: 3 - CMP14+EGFR  2. Primary insomnia Controlled substance contract reviewed and signed.  Obtain urine drug screen.  Advised her to try and take half a tablet at bedtime if needed for sleep.  We discussed the risk of this medication, particularly after age 71. - ToxASSURE Select 34 (MW), Urine - zolpidem (AMBIEN) 10 MG tablet; Take 0.5 tablets (5 mg total) by mouth at bedtime as needed.  Dispense: 30 tablet; Refill: 5  3. Controlled substance agreement signed National card database was reviewed and there were no red flags.  Ambien refilled - ToxASSURE Select 13 (MW), Urine  4. Bladder pain Check urine dip.  Rule out infection.  We discussed consideration for urology of ongoing issue in the absence of infection. - Urinalysis   Orders Placed This Encounter  Procedures  . ToxASSURE Select 13 (MW), Urine  . CMP14+EGFR  . Urinalysis   Meds ordered this encounter  Medications  . rosuvastatin (CRESTOR) 10 MG tablet    Sig: Take 1 tablet (10 mg total) by mouth daily.    Dispense:  90 tablet    Refill:  3  . fenofibrate 160 MG tablet    Sig: TAKE 1 TABLET (160 MG TOTAL) DAILY BY MOUTH.    Dispense:  90 tablet    Refill:  3  . zolpidem (AMBIEN) 10 MG tablet    Sig: Take 0.5 tablets (5 mg total) by mouth at bedtime as needed.    Dispense:  30 tablet    Refill:  5    This request is for a new prescription for a controlled substance as required by Federal/State law.     [name], DO [place] [phone]

## 2018-10-21 LAB — CMP14+EGFR
ALT: 20 IU/L (ref 0–32)
AST: 21 IU/L (ref 0–40)
Albumin/Globulin Ratio: 2.6 — ABNORMAL HIGH (ref 1.2–2.2)
Albumin: 4.9 g/dL — ABNORMAL HIGH (ref 3.8–4.8)
Alkaline Phosphatase: 70 IU/L (ref 39–117)
BUN/Creatinine Ratio: 19 (ref 12–28)
BUN: 20 mg/dL (ref 8–27)
Bilirubin Total: 0.8 mg/dL (ref 0.0–1.2)
CO2: 23 mmol/L (ref 20–29)
Calcium: 10.1 mg/dL (ref 8.7–10.3)
Chloride: 104 mmol/L (ref 96–106)
Creatinine, Ser: 1.05 mg/dL — ABNORMAL HIGH (ref 0.57–1.00)
GFR calc Af Amer: 64 mL/min/{1.73_m2} (ref >59)
GFR calc non Af Amer: 55 mL/min/{1.73_m2} — ABNORMAL LOW (ref >59)
Globulin, Total: 1.9 g/dL (ref 1.5–4.5)
Glucose: 103 mg/dL — ABNORMAL HIGH (ref 65–99)
Potassium: 5.3 mmol/L — ABNORMAL HIGH (ref 3.5–5.2)
Sodium: 143 mmol/L (ref 134–144)
Total Protein: 6.8 g/dL (ref 6.0–8.5)

## 2018-10-22 DIAGNOSIS — D2239 Melanocytic nevi of other parts of face: Secondary | ICD-10-CM | POA: Diagnosis not present

## 2018-10-22 DIAGNOSIS — L700 Acne vulgaris: Secondary | ICD-10-CM | POA: Diagnosis not present

## 2018-10-22 DIAGNOSIS — L820 Inflamed seborrheic keratosis: Secondary | ICD-10-CM | POA: Diagnosis not present

## 2018-10-22 LAB — TOXASSURE SELECT 13 (MW), URINE

## 2018-11-21 ENCOUNTER — Other Ambulatory Visit: Payer: Self-pay

## 2018-11-21 DIAGNOSIS — R6889 Other general symptoms and signs: Secondary | ICD-10-CM | POA: Diagnosis not present

## 2018-11-21 DIAGNOSIS — Z20822 Contact with and (suspected) exposure to covid-19: Secondary | ICD-10-CM

## 2018-11-22 LAB — NOVEL CORONAVIRUS, NAA: SARS-CoV-2, NAA: NOT DETECTED

## 2018-12-02 DIAGNOSIS — M7751 Other enthesopathy of right foot: Secondary | ICD-10-CM | POA: Diagnosis not present

## 2018-12-02 DIAGNOSIS — R69 Illness, unspecified: Secondary | ICD-10-CM | POA: Diagnosis not present

## 2018-12-02 DIAGNOSIS — M2041 Other hammer toe(s) (acquired), right foot: Secondary | ICD-10-CM | POA: Diagnosis not present

## 2018-12-25 ENCOUNTER — Other Ambulatory Visit: Payer: Self-pay | Admitting: Family Medicine

## 2018-12-25 ENCOUNTER — Other Ambulatory Visit: Payer: Self-pay | Admitting: Nurse Practitioner

## 2018-12-25 DIAGNOSIS — J029 Acute pharyngitis, unspecified: Secondary | ICD-10-CM

## 2018-12-25 NOTE — Telephone Encounter (Signed)
Pt to call pharmacy, refill sent in on 10/20/18 #90 with 3 refills

## 2018-12-26 DIAGNOSIS — Z1231 Encounter for screening mammogram for malignant neoplasm of breast: Secondary | ICD-10-CM | POA: Diagnosis not present

## 2018-12-31 ENCOUNTER — Telehealth: Payer: Self-pay | Admitting: Family Medicine

## 2018-12-31 NOTE — Telephone Encounter (Signed)
Informed pt refilled on 10/20/18 #90 with 3 refills to contact [facility] asking for refill by name instead of Rx number, sometimes Rx numbers change when RFs are sent back in

## 2018-12-31 NOTE — Telephone Encounter (Signed)
What is the name of the medication? fenofibrate 160 MG tablet   Have you contacted your pharmacy to request a refill? yes  Which pharmacy would you like this sent to? [facility]    Patient notified that their request is being sent to the clinical staff for review and that they should receive a call once it is complete. If they do not receive a call within 24 hours they can check with their pharmacy or our office.

## 2019-01-20 DIAGNOSIS — B078 Other viral warts: Secondary | ICD-10-CM | POA: Diagnosis not present

## 2019-01-20 DIAGNOSIS — D485 Neoplasm of uncertain behavior of skin: Secondary | ICD-10-CM | POA: Diagnosis not present

## 2019-01-20 DIAGNOSIS — L308 Other specified dermatitis: Secondary | ICD-10-CM | POA: Diagnosis not present

## 2019-01-27 DIAGNOSIS — L720 Epidermal cyst: Secondary | ICD-10-CM | POA: Diagnosis not present

## 2019-01-27 DIAGNOSIS — L723 Sebaceous cyst: Secondary | ICD-10-CM | POA: Diagnosis not present

## 2019-02-11 ENCOUNTER — Ambulatory Visit: Payer: Medicare HMO | Admitting: Family Medicine

## 2019-04-21 ENCOUNTER — Other Ambulatory Visit: Payer: Self-pay

## 2019-04-22 ENCOUNTER — Ambulatory Visit (INDEPENDENT_AMBULATORY_CARE_PROVIDER_SITE_OTHER): Payer: Medicare HMO | Admitting: Family Medicine

## 2019-04-22 ENCOUNTER — Encounter: Payer: Self-pay | Admitting: Family Medicine

## 2019-04-22 VITALS — BP 135/78 | HR 76 | Temp 99.1°F | Ht 62.0 in | Wt 170.0 lb

## 2019-04-22 DIAGNOSIS — E785 Hyperlipidemia, unspecified: Secondary | ICD-10-CM

## 2019-04-22 DIAGNOSIS — Z800 Family history of malignant neoplasm of digestive organs: Secondary | ICD-10-CM

## 2019-04-22 DIAGNOSIS — E875 Hyperkalemia: Secondary | ICD-10-CM

## 2019-04-22 DIAGNOSIS — F5101 Primary insomnia: Secondary | ICD-10-CM

## 2019-04-22 DIAGNOSIS — R69 Illness, unspecified: Secondary | ICD-10-CM | POA: Diagnosis not present

## 2019-04-22 MED ORDER — ZOLPIDEM TARTRATE 10 MG PO TABS: 5.0000 mg | ORAL_TABLET | Freq: Every evening | ORAL | 1 refills | Status: DC | PRN

## 2019-04-22 NOTE — Progress Notes (Signed)
Subjective: CC: HLD, insomnia PCP: [name], DO TKZ:[id number] Nichole Cunningham is a 69 y.o. female presenting to clinic today for:  1. Insomnia Patient reports that she does well on the Ambien.  She will take anywhere between 1/2 tablet and 1 full tablet at bedtime.  She uses this extremely rarely and does not need a refill yet.  Denies any excessive daytime sleepiness, hallucinations or falls.  2.  Hyperlipidemia Patient reports compliance with Crestor and fenofibrate.  No chest pain, shortness of breath, abdominal pain.  3.  Family history of colon cancer Patient reports a family history of colon cancer in her sister, which was diagnosed last year.  She also notes that her brother was diagnosed with a metastatic cancer.  Though she is not sure what the primary origin was.  She wonders if there is a special test for cancer screening.  She is up-to-date on all of her preventative care.  Her last colonoscopy was normal and she was instructed to follow-up in 10 years.  She denies any rectal bleeding, nausea, vomiting, early satiety, night sweats, unplanned weight loss.  ROS: Per HPI  No Known Allergies Past Medical History:  Diagnosis Date  . Complication of anesthesia   . Hyperlipidemia   . PONV (postoperative nausea and vomiting)     Current Outpatient Medications:  .  Cholecalciferol (VITAMIN D3 SUPER STRENGTH) 2000 UNITS TABS, Take 1 tablet by mouth daily., Disp: , Rfl:  .  fenofibrate 160 MG tablet, TAKE 1 TABLET (160 MG TOTAL) DAILY BY MOUTH., Disp: 90 tablet, Rfl: 3 .  Omega-3 Fatty Acids (OMEGA 3 PO), Take by mouth., Disp: , Rfl:  .  rosuvastatin (CRESTOR) 10 MG tablet, Take 1 tablet (10 mg total) by mouth daily., Disp: 90 tablet, Rfl: 3 .  zolpidem (AMBIEN) 10 MG tablet, Take 0.5 tablets (5 mg total) by mouth at bedtime as needed., Disp: 30 tablet, Rfl: 5 Social History   Socioeconomic History  . Marital status: Married    Spouse name: [name]  . Number of children: 2  .  Years of education: 56  . Highest education level: High school graduate  Occupational History  . Occupation: retired  Tobacco Use  . Smoking status: Never Smoker  . Smokeless tobacco: Never Used  Substance and Sexual Activity  . Alcohol use: No  . Drug use: No  . Sexual activity: Yes    Birth control/protection: Post-menopausal  Other Topics Concern  . Not on file  Social History Narrative  . Not on file   Social Determinants of Health   Financial Resource Strain: Low Risk   . Difficulty of Paying Living Expenses: Not hard at all  Food Insecurity: No Food Insecurity  . Worried About Charity fundraiser in the Last Year: Never true  . Ran Out of Food in the Last Year: Never true  Transportation Needs: No Transportation Needs  . Lack of Transportation (Medical): No  . Lack of Transportation (Non-Medical): No  Physical Activity: Inactive  . Days of Exercise per Week: 0 days  . Minutes of Exercise per Session: 0 min  Stress: No Stress Concern Present  . Feeling of Stress : Not at all  Social Connections: Not Isolated  . Frequency of Communication with Friends and Family: More than three times a week  . Frequency of Social Gatherings with Friends and Family: More than three times a week  . Attends Religious Services: More than 4 times per year  . Active Member of  Clubs or Organizations: Yes  . Attends Archivist Meetings: More than 4 times per year  . Marital Status: Married  Human resources officer Violence: Not At Risk  . Fear of Current or Ex-Partner: No  . Emotionally Abused: No  . Physically Abused: No  . Sexually Abused: No   Family History  Problem Relation Age of Onset  . Breast cancer Maternal Aunt   . Cancer Mother   . Heart disease Father   . Cancer Sister   . Cancer Brother   . Anesthesia problems Neg Hx   . Hypotension Neg Hx   . Malignant hyperthermia Neg Hx   . Pseudochol deficiency Neg Hx   . Colon cancer Neg Hx   . Stomach cancer Neg Hx      Objective: Office vital signs reviewed. BP 135/78   Pulse 76   Temp 99.1 F (37.3 C) (Temporal)   Ht '5\' 2"'$  (1.575 m)   Wt 170 lb (77.1 kg)   SpO2 96%   BMI 31.09 kg/m   Physical Examination:  General: Awake, alert, well nourished, No acute distress HEENT: Normal, sclera white  Cardio: regular rate and rhythm, S1S2 heard, no murmurs appreciated Pulm: clear to auscultation bilaterally, no wheezes, rhonchi or rales; normal work of breathing on room air Psych: Mood stable, speech normal, affect appropriate. Depression screen [facility] 2/9 04/22/2019 10/20/2018 09/30/2018  Decreased Interest 0 0 0  Down, Depressed, Hopeless 0 0 0  PHQ - 2 Score 0 0 0  Altered sleeping 0 0 -  Tired, decreased energy 0 0 -  Change in appetite 0 0 -  Feeling bad or failure about yourself  0 0 -  Trouble concentrating 0 0 -  Moving slowly or fidgety/restless 0 0 -  Suicidal thoughts 0 0 -  PHQ-9 Score 0 0 -   Assessment/ Plan: 68 y.o. female   1. Primary insomnia National chronic database was reviewed and there were no red flags.  I will place the prescription for Ambien on file since she has not needed this but her last 1 was indeed expired. - zolpidem (AMBIEN) 10 MG tablet; Take 0.5 tablets (5 mg total) by mouth at bedtime as needed for sleep. PUT ON FILE.  Dispense: 30 tablet; Refill: 1  2. Hyperlipidemia with target LDL less than 100 Continue current regimen.  Check LFTs and lipid panel given concomitant use of statin and fenofibrate.  Not having any adverse reactions to this combination - TSH - Lipid Panel - CMP14+EGFR  3. Hyperkalemia Noted on last metabolic panel.  We will recheck today - CMP14+EGFR  4. Family history of colon cancer I have advised her to go ahead and contact her gastroenterologist with the information she provided me today.  Given her sister's history of colon cancer I wonder if she may need closer follow-up than every 10 years.  Additionally, have given her FOBT to  collect at home and return to the office at her convenience. - CBC - Fecal occult blood, imunochemical; Future   No orders of the defined types were placed in this encounter.  No orders of the defined types were placed in this encounter.    [name], DO [place] [phone]

## 2019-04-22 NOTE — Patient Instructions (Addendum)
You had labs performed today.  You will be contacted with the results of the labs once they are available, usually in the next 3 business days for routine lab work.  If you have an active my chart account, they will be released to your MyChart.  If you prefer to have these labs released to you via telephone, please let us know.  If you had a pap smear or biopsy performed, expect to be contacted in about 7-10 days.   Call your GI doctor about scheduling your colonoscopy sooner since your sister had colon cancer last year.

## 2019-04-23 LAB — CMP14+EGFR
ALT: 27 IU/L (ref 0–32)
AST: 27 IU/L (ref 0–40)
Albumin/Globulin Ratio: 2 (ref 1.2–2.2)
Albumin: 4.5 g/dL (ref 3.8–4.8)
Alkaline Phosphatase: 65 IU/L (ref 39–117)
BUN/Creatinine Ratio: 17 (ref 12–28)
BUN: 18 mg/dL (ref 8–27)
Bilirubin Total: 0.6 mg/dL (ref 0.0–1.2)
CO2: 20 mmol/L (ref 20–29)
Calcium: 9.6 mg/dL (ref 8.7–10.3)
Chloride: 106 mmol/L (ref 96–106)
Creatinine, Ser: 1.09 mg/dL — ABNORMAL HIGH (ref 0.57–1.00)
GFR calc Af Amer: 60 mL/min/{1.73_m2} (ref >59)
GFR calc non Af Amer: 52 mL/min/{1.73_m2} — ABNORMAL LOW (ref >59)
Globulin, Total: 2.2 g/dL (ref 1.5–4.5)
Glucose: 119 mg/dL — ABNORMAL HIGH (ref 65–99)
Potassium: 4.3 mmol/L (ref 3.5–5.2)
Sodium: 142 mmol/L (ref 134–144)
Total Protein: 6.7 g/dL (ref 6.0–8.5)

## 2019-04-23 LAB — LIPID PANEL
Chol/HDL Ratio: 3.3 ratio (ref 0.0–4.4)
Cholesterol, Total: 131 mg/dL (ref 100–199)
HDL: 40 mg/dL (ref >39)
LDL Chol Calc (NIH): 69 mg/dL (ref 0–99)
Triglycerides: 122 mg/dL (ref 0–149)
VLDL Cholesterol Cal: 22 mg/dL (ref 5–40)

## 2019-04-23 LAB — CBC
Hematocrit: 42.5 % (ref 34.0–46.6)
Hemoglobin: 14.4 g/dL (ref 11.1–15.9)
MCH: 31.6 pg (ref 26.6–33.0)
MCHC: 33.9 g/dL (ref 31.5–35.7)
MCV: 93 fL (ref 79–97)
Platelets: 218 10*3/uL (ref 150–450)
RBC: 4.56 x10E6/uL (ref 3.77–5.28)
RDW: 12 % (ref 11.7–15.4)
WBC: 7.3 10*3/uL (ref 3.4–10.8)

## 2019-04-23 LAB — TSH: TSH: 2.73 u[IU]/mL (ref 0.450–4.500)

## 2019-05-14 ENCOUNTER — Other Ambulatory Visit: Payer: Self-pay | Admitting: Nurse Practitioner

## 2019-05-14 DIAGNOSIS — J029 Acute pharyngitis, unspecified: Secondary | ICD-10-CM

## 2019-06-08 DIAGNOSIS — L718 Other rosacea: Secondary | ICD-10-CM | POA: Diagnosis not present

## 2019-06-08 DIAGNOSIS — B078 Other viral warts: Secondary | ICD-10-CM | POA: Diagnosis not present

## 2019-08-04 DIAGNOSIS — R69 Illness, unspecified: Secondary | ICD-10-CM | POA: Diagnosis not present

## 2019-08-08 ENCOUNTER — Other Ambulatory Visit: Payer: Self-pay | Admitting: Family Medicine

## 2019-08-08 DIAGNOSIS — E785 Hyperlipidemia, unspecified: Secondary | ICD-10-CM

## 2019-10-07 ENCOUNTER — Ambulatory Visit (INDEPENDENT_AMBULATORY_CARE_PROVIDER_SITE_OTHER): Payer: Medicare HMO

## 2019-10-07 DIAGNOSIS — Z0000 Encounter for general adult medical examination without abnormal findings: Secondary | ICD-10-CM | POA: Diagnosis not present

## 2019-10-07 NOTE — Progress Notes (Signed)
MEDICARE ANNUAL WELLNESS VISIT  10/07/2019  Telephone Visit Disclaimer This Medicare AWV was conducted by telephone due to national recommendations for restrictions regarding the COVID-19 Pandemic (e.g. social distancing).  I verified, using two identifiers, that I am speaking with Nichole Cunningham or their authorized healthcare agent. I discussed the limitations, risks, security, and privacy concerns of performing an evaluation and management service by telephone and the potential availability of an in-person appointment in the future. The patient expressed understanding and agreed to proceed.   I connected with patient via telephone.  Patient was at her home and I was in the office.   Subjective:  Nichole Cunningham is a 69 y.o. female patient of Nichole Norlander, DO who had a Medicare Annual Wellness Visit today via telephone. Nichole Cunningham is Retired and lives with their spouse. She has two children and four grandchildren.. She reports that she is socially active and does interact with friends/family regularly. She is minimally physically active and enjoys shopping with friends and working jigsaw puzzles  In her spare time.  Patient Care Team: Nichole Norlander, DO as PCP - General (Family Medicine) Vania Rea, MD as Attending Physician (Obstetrics and Gynecology)  Advanced Directives 10/07/2019 09/30/2018 04/11/2011  Does Patient Have a Medical Advance Directive? Yes No Patient does not have advance directive;Patient would not like information  Type of Advance Directive Chancellor - -  Does patient want to make changes to medical advance directive? No - Patient declined - -  Copy of [place] in Chart? No - copy requested - -  Would patient like information on creating a medical advance directive? No - Patient declined No - Patient declined -  Pre-existing out of facility DNR order (yellow form or pink MOST form) - - No    Hospital Utilization Over the  Past 12 Months: # of hospitalizations or ER visits: 0 # of surgeries: 0  Review of Systems    Patient reports that her overall health is unchanged compared to last year.  History obtained from chart review and the patient  Patient Reported Readings (BP, Pulse, CBG, Weight, etc) none  Pain Assessment Pain : No/denies pain     Current Medications & Allergies (verified) Allergies as of 10/07/2019   No Known Allergies     Medication List       Accurate as of October 07, 2019  8:32 AM. If you have any questions, ask your nurse or doctor.        fenofibrate 160 MG tablet TAKE 1 TABLET (160 MG TOTAL) DAILY BY MOUTH.   fluticasone 50 MCG/ACT nasal spray Commonly known as: FLONASE PLACE 1 SPRAY INTO BOTH NOSTRILS 2 (TWO) TIMES DAILY AS NEEDED FOR ALLERGIES OR RHINITIS.   OMEGA 3 PO Take by mouth.   rosuvastatin 10 MG tablet Commonly known as: CRESTOR TAKE 1 TABLET BY MOUTH EVERY DAY   Vitamin D3 Super Strength 50 MCG (2000 UT) Tabs Generic drug: Cholecalciferol Take 1 tablet by mouth daily.   zolpidem 10 MG tablet Commonly known as: AMBIEN Take 0.5 tablets (5 mg total) by mouth at bedtime as needed for sleep. PUT ON FILE.       History (reviewed): Past Medical History:  Diagnosis Date  . Complication of anesthesia   . Hyperlipidemia   . PONV (postoperative nausea and vomiting)    Past Surgical History:  Procedure Laterality Date  . CHOLECYSTECTOMY  04/13/2011   Procedure: LAPAROSCOPIC CHOLECYSTECTOMY;  Surgeon:  Donato Heinz, MD;  Location: [facility];  Service: General;  Laterality: N/A;  . HEMORRHOID SURGERY  2003   [facility]  . SKIN LESION EXCISION  02/2018   mole removed from forehead    Family History  Problem Relation Age of Onset  . Breast cancer Maternal Aunt   . Cancer Mother   . Heart disease Father   . Heart attack Father        cause of death  . Cancer Sister   . Colon cancer Sister        dx 2020  . Cancer Brother        metastatic w/  uncertain primary dx 3419  . Anesthesia problems Neg Hx   . Hypotension Neg Hx   . Malignant hyperthermia Neg Hx   . Pseudochol deficiency Neg Hx   . Stomach cancer Neg Hx    Social History   Socioeconomic History  . Marital status: Married    Spouse name: Nichole Cunningham  . Number of children: 2  . Years of education: 68  . Highest education level: High school graduate  Occupational History  . Occupation: retired  Tobacco Use  . Smoking status: Never Smoker  . Smokeless tobacco: Never Used  Vaping Use  . Vaping Use: Never used  Substance and Sexual Activity  . Alcohol use: No  . Drug use: No  . Sexual activity: Yes    Birth control/protection: Post-menopausal  Other Topics Concern  . Not on file  Social History Narrative  . Not on file   Social Determinants of Health   Financial Resource Strain:   . Difficulty of Paying Living Expenses:   Food Insecurity:   . Worried About Charity fundraiser in the Last Year:   . Arboriculturist in the Last Year:   Transportation Needs:   . Film/video editor (Medical):   Marland Kitchen Lack of Transportation (Non-Medical):   Physical Activity:   . Days of Exercise per Week:   . Minutes of Exercise per Session:   Stress:   . Feeling of Stress :   Social Connections:   . Frequency of Communication with Friends and Family:   . Frequency of Social Gatherings with Friends and Family:   . Attends Religious Services:   . Active Member of Clubs or Organizations:   . Attends Archivist Meetings:   Marland Kitchen Marital Status:     Activities of Daily Living In your present state of health, do you have any difficulty performing the following activities: 10/07/2019  Hearing? N  Vision? N  Difficulty concentrating or making decisions? N  Walking or climbing stairs? N  Dressing or bathing? N  Doing errands, shopping? N  Preparing Food and eating ? N  Using the Toilet? N  In the past six months, have you accidently leaked urine? N  Do you have  problems with loss of bowel control? N  Managing your Medications? N  Managing your Finances? N  Housekeeping or managing your Housekeeping? N  Some recent data might be hidden    Patient Education/ Literacy How often do you need to have someone help you when you read instructions, pamphlets, or other written materials from your doctor or pharmacy?: 1 - Never What is the last grade level you completed in school?: 12th  Exercise Current Exercise Habits: The patient does not participate in regular exercise at present, Exercise limited by: None identified  Diet Patient reports consuming 2 meals a day  and 2 snack(s) a day Patient reports that her primary diet is: Regular Patient reports that she does have regular access to food.   Depression Screen PHQ 2/9 Scores 10/07/2019 04/22/2019 10/20/2018 09/30/2018 05/13/2018 04/07/2018 01/17/2018  PHQ - 2 Score 0 0 0 0 0 0 0  PHQ- 9 Score - 0 0 - 0 - -     Fall Risk Fall Risk  10/07/2019 04/22/2019 10/20/2018 09/30/2018 05/13/2018  Falls in the past year? 0 0 0 0 0  Follow up Falls evaluation completed - - - -     Objective:  Nichole Cunningham seemed alert and oriented and she participated appropriately during our telephone visit.  Blood Pressure Weight BMI  BP Readings from Last 3 Encounters:  04/22/19 135/78  10/20/18 129/85  05/13/18 139/89   Wt Readings from Last 3 Encounters:  04/22/19 170 lb (77.1 kg)  10/20/18 167 lb (75.8 kg)  05/13/18 167 lb 12.8 oz (76.1 kg)   BMI Readings from Last 1 Encounters:  04/22/19 31.09 kg/m    *Unable to obtain current vital signs, weight, and BMI due to telephone visit type  Hearing/Vision  . Dorena did not seem to have difficulty with hearing/understanding during the telephone conversation . Reports that she has not had a formal eye exam by an eye care professional within the past year . Reports that she has not had a formal hearing evaluation within the past year *Unable to fully assess hearing and  vision during telephone visit type  Cognitive Function: 6CIT Screen 10/07/2019 09/30/2018  What Year? 0 points 0 points  What month? 0 points 0 points  What time? 0 points 0 points  Count back from 20 0 points 0 points  Months in reverse 4 points 4 points  Repeat phrase 4 points 2 points  Total Score 8 6   (Normal:0-7, Significant for Dysfunction: >8)  Normal Cognitive Function Screening: Yes   Immunization & Health Maintenance Record Immunization History  Administered Date(s) Administered  . Pneumococcal Conjugate-13 01/07/2017  . Pneumococcal Polysaccharide-23 04/07/2018  . Td 05/30/1999    Health Maintenance  Topic Date Due  . COVID-19 Vaccine (1) Never done  . TETANUS/TDAP  05/29/2009  . INFLUENZA VACCINE  09/27/2019  . DEXA SCAN  10/15/2019  . MAMMOGRAM  12/25/2020  . COLONOSCOPY  03/14/2022  . Hepatitis C Screening  Completed  . PNA vac Low Risk Adult  Completed       Assessment  This is a routine wellness examination for Nichole Cunningham.  Health Maintenance: Due or Overdue Health Maintenance Due  Topic Date Due  . COVID-19 Vaccine (1) Never done  . TETANUS/TDAP  05/29/2009  . INFLUENZA VACCINE  09/27/2019    Nichole Cunningham does not need a referral for Community Assistance: Care Management:   no Social Work:    no Prescription Assistance:  no Nutrition/Diabetes Education:  no   Plan:  Personalized Goals Goals Addressed            This Visit's Progress   . Patient Stated       10/07/2019 AWV Goal: Exercise for General Health   Patient will verbalize understanding of the benefits of increased physical activity:  Exercising regularly is important. It will improve your overall fitness, flexibility, and endurance.  Regular exercise also will improve your overall health. It can help you control your weight, reduce stress, and improve your bone density.  Over the next year, patient will increase physical activity as tolerated with a  goal of at least  150 minutes of moderate physical activity per week.   You can tell that you are exercising at a moderate intensity if your heart starts beating faster and you start breathing faster but can still hold a conversation.  Moderate-intensity exercise ideas include:  Walking 1 mile (1.6 km) in about 15 minutes  Biking  Hiking  Golfing  Dancing  Water aerobics  Patient will verbalize understanding of everyday activities that increase physical activity by providing examples like the following: ? Yard work, such as: ? Pushing a Conservation officer, nature ? Raking and bagging leaves ? Washing your car ? Pushing a stroller ? Shoveling snow ? Gardening ? Washing windows or floors  Patient will be able to explain general safety guidelines for exercising:   Before you start a new exercise program, talk with your health care provider.  Do not exercise so much that you hurt yourself, feel dizzy, or get very short of breath.  Wear comfortable clothes and wear shoes with good support.  Drink plenty of water while you exercise to prevent dehydration or heat stroke.  Work out until your breathing and your heartbeat get faster.       Personalized Health Maintenance & Screening Recommendations  Td vaccine Bone densitometry screening  Lung Cancer Screening Recommended: no (Low Dose CT Chest recommended if Age 40-80 years, 30 pack-year currently smoking OR have quit w/in past 15 years) Hepatitis C Screening recommended: no HIV Screening recommended: no  Advanced Directives: Written information was not prepared per patient's request.  Referrals & Orders No orders of the defined types were placed in this encounter.   Follow-up Plan . Follow-up with Nichole Norlander, DO as planned . Schedule eye exam . Schedule dexa scan   I have personally reviewed and noted the following in the patient's chart:   . Medical and social history . Use of alcohol, tobacco or illicit drugs  . Current  medications and supplements . Functional ability and status . Nutritional status . Physical activity . Advanced directives . List of other physicians . Hospitalizations, surgeries, and ER visits in previous 12 months . Vitals . Screenings to include cognitive, depression, and falls . Referrals and appointments  In addition, I have reviewed and discussed with Nichole Cunningham certain preventive protocols, quality metrics, and best practice recommendations. A written personalized care plan for preventive services as well as general preventive health recommendations is available and can be mailed to the patient at her request.      Felicity Coyer, LPN    9/93/[id number]

## 2019-10-20 ENCOUNTER — Ambulatory Visit: Payer: Medicare HMO | Admitting: Family Medicine

## 2019-10-21 ENCOUNTER — Ambulatory Visit (INDEPENDENT_AMBULATORY_CARE_PROVIDER_SITE_OTHER): Payer: Medicare HMO | Admitting: Family Medicine

## 2019-10-21 ENCOUNTER — Other Ambulatory Visit: Payer: Self-pay

## 2019-10-21 ENCOUNTER — Encounter: Payer: Self-pay | Admitting: Family Medicine

## 2019-10-21 VITALS — BP 120/72 | HR 87 | Temp 98.2°F | Ht 62.0 in | Wt 167.4 lb

## 2019-10-21 DIAGNOSIS — R739 Hyperglycemia, unspecified: Secondary | ICD-10-CM

## 2019-10-21 DIAGNOSIS — G4719 Other hypersomnia: Secondary | ICD-10-CM

## 2019-10-21 DIAGNOSIS — F5101 Primary insomnia: Secondary | ICD-10-CM | POA: Diagnosis not present

## 2019-10-21 DIAGNOSIS — R0683 Snoring: Secondary | ICD-10-CM

## 2019-10-21 DIAGNOSIS — Z79891 Long term (current) use of opiate analgesic: Secondary | ICD-10-CM | POA: Diagnosis not present

## 2019-10-21 DIAGNOSIS — E785 Hyperlipidemia, unspecified: Secondary | ICD-10-CM

## 2019-10-21 DIAGNOSIS — R7989 Other specified abnormal findings of blood chemistry: Secondary | ICD-10-CM | POA: Diagnosis not present

## 2019-10-21 DIAGNOSIS — Z0289 Encounter for other administrative examinations: Secondary | ICD-10-CM | POA: Diagnosis not present

## 2019-10-21 DIAGNOSIS — R69 Illness, unspecified: Secondary | ICD-10-CM | POA: Diagnosis not present

## 2019-10-21 MED ORDER — ZOLPIDEM TARTRATE 10 MG PO TABS: 5.0000 mg | ORAL_TABLET | Freq: Every evening | ORAL | 1 refills | Status: DC | PRN

## 2019-10-21 NOTE — Progress Notes (Signed)
Subjective: CC: HLD, insomnia PCP: [name], DO Nichole Cunningham is a 69 y.o. female presenting to clinic today for:  1. Insomnia/ snoring Patient reports that she does well on the Ambien.  She will take anywhere between 1/2 tablet and 1 full tablet at bedtime, usually just 1/2 tablet and very rarely at that. REPORTS excessive daytime sleepiness (husband reports loud snoring).  She often wakes with dry mouth and has a.m. headaches. Denies hallucinations or falls.    2.  Hyperlipidemia Patient reports compliance with Crestor and fenofibrate.  No chest pain, shortness of breath, abdominal pain.  Sometimes she experiences chest tightness.    ROS: Per HPI  No Known Allergies Past Medical History:  Diagnosis Date   Complication of anesthesia    Hyperlipidemia    PONV (postoperative nausea and vomiting)     Current Outpatient Medications:    Cholecalciferol (VITAMIN D3 SUPER STRENGTH) 2000 UNITS TABS, Take 1 tablet by mouth daily., Disp: , Rfl:    fenofibrate 160 MG tablet, TAKE 1 TABLET (160 MG TOTAL) DAILY BY MOUTH., Disp: 90 tablet, Rfl: 3   fluticasone (FLONASE) 50 MCG/ACT nasal spray, PLACE 1 SPRAY INTO BOTH NOSTRILS 2 (TWO) TIMES DAILY AS NEEDED FOR ALLERGIES OR RHINITIS., Disp: 48 mL, Rfl: 5   Omega-3 Fatty Acids (OMEGA 3 PO), Take by mouth., Disp: , Rfl:    rosuvastatin (CRESTOR) 10 MG tablet, TAKE 1 TABLET BY MOUTH EVERY DAY, Disp: 90 tablet, Rfl: 0   zolpidem (AMBIEN) 10 MG tablet, Take 0.5 tablets (5 mg total) by mouth at bedtime as needed for sleep. PUT ON FILE., Disp: 30 tablet, Rfl: 1 Social History   Socioeconomic History   Marital status: Married    Spouse name: Charles   Number of children: 2   Years of education: 12   Highest education level: High school graduate  Occupational History   Occupation: retired  Tobacco Use   Smoking status: Never Smoker   Smokeless tobacco: Never Used  Scientific laboratory technician Use: Never used  Substance  and Sexual Activity   Alcohol use: No   Drug use: No   Sexual activity: Yes    Birth control/protection: Post-menopausal  Other Topics Concern   Not on file  Social History Narrative   Not on file   Social Determinants of Health   Financial Resource Strain:    Difficulty of Paying Living Expenses: Not on file  Food Insecurity:    Worried About Charity fundraiser in the Last Year: Not on file   [employer] of Food in the Last Year: Not on file  Transportation Needs:    Lack of Transportation (Medical): Not on file   Lack of Transportation (Non-Medical): Not on file  Physical Activity:    Days of Exercise per Week: Not on file   Minutes of Exercise per Session: Not on file  Stress:    Feeling of Stress : Not on file  Social Connections:    Frequency of Communication with Friends and Family: Not on file   Frequency of Social Gatherings with Friends and Family: Not on file   Attends Religious Services: Not on file   Active Member of Clubs or Organizations: Not on file   Attends Archivist Meetings: Not on file   Marital Status: Not on file  Intimate Partner Violence:    Fear of Current or Ex-Partner: Not on file   Emotionally Abused: Not on file   Physically Abused: Not  on file   Sexually Abused: Not on file   Family History  Problem Relation Age of Onset   Breast cancer Maternal Aunt    Cancer Mother    Heart disease Father    Heart attack Father        cause of death   Cancer Sister    Colon cancer Sister        dx 2020   Cancer Brother        metastatic w/ uncertain primary dx 1610   Anesthesia problems Neg Hx    Hypotension Neg Hx    Malignant hyperthermia Neg Hx    Pseudochol deficiency Neg Hx    Stomach cancer Neg Hx     Objective: Office vital signs reviewed. BP 120/72    Pulse 87    Temp 98.2 F (36.8 C)    Ht 5\' 2"  (1.575 m)    Wt 167 lb 6.4 oz (75.9 kg)    SpO2 98%    BMI 30.62 kg/m   Physical Examination:    General: Awake, alert, well nourished, No acute distress HEENT: Normal, sclera white Neck girth: 17" Cardio: regular rate and rhythm, S1S2 heard, no murmurs appreciated Pulm: clear to auscultation bilaterally, no wheezes, rhonchi or rales; normal work of breathing on room air Psych: Mood stable, speech normal, affect appropriate. Depression screen [employer] 2/9 10/21/2019 10/07/2019 04/22/2019  Decreased Interest 0 0 0  Down, Depressed, Hopeless 0 0 0  PHQ - 2 Score 0 0 0  Altered sleeping - - 0  Tired, decreased energy - - 0  Change in appetite - - 0  Feeling bad or failure about yourself  - - 0  Trouble concentrating - - 0  Moving slowly or fidgety/restless - - 0  Suicidal thoughts - - 0  PHQ-9 Score - - 0   Assessment/ Plan: 69 y.o. female   1. Primary insomnia Good sleep but following day.  Suspect component of OSA, referred to sleep studies.  National narcotic database was reviewed and there were no red flags.  CSC and UDS updated - ToxASSURE Select 13 (MW), Urine - zolpidem (AMBIEN) 10 MG tablet; Take 0.5-1 tablets (5-10 mg total) by mouth at bedtime as needed for sleep.  Dispense: 30 tablet; Refill: 1  2. Medication management contract signed - ToxASSURE Select 13 (MW), Urine  3. Elevated serum creatinine Recheck renal function panel - Renal Function Panel; Future  4. Snoring STOP BANG score 3 - Ambulatory referral to Neurology  5. Excessive daytime sleepiness - Ambulatory referral to Neurology  6. Hyperlipidemia with target LDL less than 100 - Lipid panel; Future  7. Elevated serum glucose - Bayer DCA Hb A1c Waived; Future   Orders Placed This Encounter  Procedures   ToxASSURE Select 13 (MW), Urine   Renal Function Panel    Standing Status:   Future    Standing Expiration Date:   10/20/2020   Bayer DCA Hb A1c Waived    Standing Status:   Future    Standing Expiration Date:   10/20/2020   Lipid panel    Standing Status:   Future    Standing Expiration  Date:   10/20/2020   No orders of the defined types were placed in this encounter.    [name], DO [place] [phone]

## 2019-10-24 LAB — TOXASSURE SELECT 13 (MW), URINE

## 2019-11-05 ENCOUNTER — Other Ambulatory Visit: Payer: Self-pay

## 2019-11-05 ENCOUNTER — Other Ambulatory Visit: Payer: Medicare HMO

## 2019-11-05 DIAGNOSIS — E785 Hyperlipidemia, unspecified: Secondary | ICD-10-CM | POA: Diagnosis not present

## 2019-11-05 DIAGNOSIS — R739 Hyperglycemia, unspecified: Secondary | ICD-10-CM

## 2019-11-05 DIAGNOSIS — R7989 Other specified abnormal findings of blood chemistry: Secondary | ICD-10-CM

## 2019-11-05 LAB — BAYER DCA HB A1C WAIVED: HB A1C (BAYER DCA - WAIVED): 6.8 % (ref <7.0)

## 2019-11-06 LAB — RENAL FUNCTION PANEL
Albumin: 4.8 g/dL (ref 3.8–4.8)
BUN/Creatinine Ratio: 21 (ref 12–28)
BUN: 21 mg/dL (ref 8–27)
CO2: 24 mmol/L (ref 20–29)
Calcium: 9.9 mg/dL (ref 8.7–10.3)
Chloride: 104 mmol/L (ref 96–106)
Creatinine, Ser: 1.02 mg/dL — ABNORMAL HIGH (ref 0.57–1.00)
GFR calc Af Amer: 65 mL/min/{1.73_m2} (ref >59)
GFR calc non Af Amer: 57 mL/min/{1.73_m2} — ABNORMAL LOW (ref >59)
Glucose: 123 mg/dL — ABNORMAL HIGH (ref 65–99)
Phosphorus: 3.9 mg/dL (ref 3.0–4.3)
Potassium: 5 mmol/L (ref 3.5–5.2)
Sodium: 141 mmol/L (ref 134–144)

## 2019-11-06 LAB — LIPID PANEL
Chol/HDL Ratio: 3.5 ratio (ref 0.0–4.4)
Cholesterol, Total: 143 mg/dL (ref 100–199)
HDL: 41 mg/dL (ref >39)
LDL Chol Calc (NIH): 73 mg/dL (ref 0–99)
Triglycerides: 168 mg/dL — ABNORMAL HIGH (ref 0–149)
VLDL Cholesterol Cal: 29 mg/dL (ref 5–40)

## 2019-11-24 ENCOUNTER — Encounter: Payer: Self-pay | Admitting: Neurology

## 2019-11-24 ENCOUNTER — Other Ambulatory Visit: Payer: Self-pay

## 2019-11-24 ENCOUNTER — Ambulatory Visit: Payer: Medicare HMO | Admitting: Neurology

## 2019-11-24 VITALS — BP 156/82 | Ht 62.0 in | Wt 169.0 lb

## 2019-11-24 DIAGNOSIS — E669 Obesity, unspecified: Secondary | ICD-10-CM

## 2019-11-24 DIAGNOSIS — R351 Nocturia: Secondary | ICD-10-CM | POA: Diagnosis not present

## 2019-11-24 DIAGNOSIS — E66811 Obesity, class 1: Secondary | ICD-10-CM

## 2019-11-24 DIAGNOSIS — R0681 Apnea, not elsewhere classified: Secondary | ICD-10-CM | POA: Diagnosis not present

## 2019-11-24 DIAGNOSIS — R0683 Snoring: Secondary | ICD-10-CM

## 2019-11-24 DIAGNOSIS — R519 Headache, unspecified: Secondary | ICD-10-CM

## 2019-11-24 NOTE — Patient Instructions (Signed)

## 2019-11-24 NOTE — Progress Notes (Signed)
Subjective:    Patient ID: Nichole Cunningham is a 69 y.o. female.  HPI     Star Age, MD, PhD [facility] [address], [address] [address] [place], [place] [address]  Dear Dr. Lajuana Ripple,   I saw your patient, Nichole Cunningham, upon your kind request in my sleep clinic today for initial consultation of her sleep disorder, in particular, concern for underlying obstructive sleep apnea.  The patient is unaccompanied today.  As you know, Nichole Cunningham is a 70 year old right-handed woman with an underlying medical history of hyperlipidemia, and borderline obesity, who reports snoring and occasional morning headaches. She has had difficulty initiating sleep for which she takes Ambien as needed.  I reviewed your office note from 10/21/2019. Her Epworth sleepiness score is 3/24, fatigue severity score is 16 out of 63.  She reports that her snoring can be loud and disturbing to her husband who has also noted some pauses in her breathing at times.  She has a history of migraines in the past but her morning headaches are more bifrontal, dull and achy, she typically does not have to take any medicine as her headache improves as she is up but sometimes she takes Tylenol.  She has nocturia once or twice per average night.  She has a TV in the bedroom and typically turns it off before falling asleep.  They have 1 dog in the household who does not sleep in the bedroom with them.  She denies any telltale symptoms of restless leg syndrome or kicking in her sleep.  She takes care of her 7-year-old granddaughter who comes early in the morning, gets dropped off at 5:30 AM.  The patient tries to be in bed between 9 PM and midnight and sometimes when she has trouble falling asleep she will take either 5 mg or 10 mg of Ambien, she may go a whole month without having to take it.  Her typical rise time is between 4 and 5:30 AM.  She has a sister with sleep apnea.  The patient drinks caffeine in the form of tea,  2 glasses/day and [facility], 2 cans/day on average.  She had a tonsillectomy as a child.  She does not drink alcohol or smoke.  She is retired for the past 5 years and reports approximately 20 pounds weight gain since her retirement.  Her Past Medical History Is Significant For: Past Medical History:  Diagnosis Date  . Complication of anesthesia   . Hyperlipidemia   . PONV (postoperative nausea and vomiting)     Her Past Surgical History Is Significant For: Past Surgical History:  Procedure Laterality Date  . CHOLECYSTECTOMY  04/13/2011   Procedure: LAPAROSCOPIC CHOLECYSTECTOMY;  Surgeon: Donato Heinz, MD;  Location: [facility];  Service: General;  Laterality: N/A;  . HEMORRHOID SURGERY  2003   [place]  . SKIN LESION EXCISION  02/2018   mole removed from forehead     Her Family History Is Significant For: Family History  Problem Relation Age of Onset  . Breast cancer Maternal Aunt   . Cancer Mother   . Heart disease Father   . Heart attack Father        cause of death  . Cancer Sister   . Colon cancer Sister        dx 2020  . Cancer Brother        metastatic w/ uncertain primary dx 4235  . Anesthesia problems Neg Hx   . Hypotension  Neg Hx   . Malignant hyperthermia Neg Hx   . Pseudochol deficiency Neg Hx   . Stomach cancer Neg Hx     Her Social History Is Significant For: Social History   Socioeconomic History  . Marital status: Married    Spouse name: [name]  . Number of children: 2  . Years of education: 62  . Highest education level: High school graduate  Occupational History  . Occupation: retired  Tobacco Use  . Smoking status: Never Smoker  . Smokeless tobacco: Never Used  Vaping Use  . Vaping Use: Never used  Substance and Sexual Activity  . Alcohol use: No  . Drug use: No  . Sexual activity: Yes    Birth control/protection: Post-menopausal  Other Topics Concern  . Not on file  Social History Narrative  . Not on file   Social  Determinants of Health   Financial Resource Strain:   . Difficulty of Paying Living Expenses: Not on file  Food Insecurity:   . Worried About Charity fundraiser in the Last Year: Not on file  . Ran Out of Food in the Last Year: Not on file  Transportation Needs:   . Lack of Transportation (Medical): Not on file  . Lack of Transportation (Non-Medical): Not on file  Physical Activity:   . Days of Exercise per Week: Not on file  . Minutes of Exercise per Session: Not on file  Stress:   . Feeling of Stress : Not on file  Social Connections:   . Frequency of Communication with Friends and Family: Not on file  . Frequency of Social Gatherings with Friends and Family: Not on file  . Attends Religious Services: Not on file  . Active Member of Clubs or Organizations: Not on file  . Attends Archivist Meetings: Not on file  . Marital Status: Not on file    Her Allergies Are:  No Known Allergies:   Her Current Medications Are:  Outpatient Encounter Medications as of 11/24/2019  Medication Sig  . Cholecalciferol (VITAMIN D3 SUPER STRENGTH) 2000 UNITS TABS Take 1 tablet by mouth daily.  . fenofibrate 160 MG tablet TAKE 1 TABLET (160 MG TOTAL) DAILY BY MOUTH.  . fluticasone (FLONASE) 50 MCG/ACT nasal spray PLACE 1 SPRAY INTO BOTH NOSTRILS 2 (TWO) TIMES DAILY AS NEEDED FOR ALLERGIES OR RHINITIS.  Marland Kitchen Omega-3 Fatty Acids (OMEGA 3 PO) Take by mouth.  . rosuvastatin (CRESTOR) 10 MG tablet TAKE 1 TABLET BY MOUTH EVERY DAY  . zolpidem (AMBIEN) 10 MG tablet Take 0.5-1 tablets (5-10 mg total) by mouth at bedtime as needed for sleep.   No facility-administered encounter medications on file as of 11/24/2019.  :  Review of Systems:  Out of a complete 14 point review of systems, all are reviewed and negative with the exception of these symptoms as listed below: Review of Systems  Neurological:       Here for sleep consult. No prior sleep study. Pt reports snoring and morning h/a are present.    Epworth Sleepiness Scale 0= would never doze 1= slight chance of dozing 2= moderate chance of dozing 3= high chance of dozing  Sitting and reading:1 Watching TV:1 Sitting inactive in a public place (ex. Theater or meeting):0 As a passenger in a car for an hour without a break:0 Lying down to rest in the afternoon:2 Sitting and talking to someone:0 Sitting quietly after lunch (no alcohol):0 In a car, while stopped in traffic:0 Total:3  Objective:  Neurological Exam  Physical Exam Physical Examination:   Vitals:   11/24/19 1312  BP: (!) 156/82    General Examination: The patient is a very pleasant 69 y.o. female in no acute distress. She appears well-developed and well-nourished and well groomed.   HEENT: Normocephalic, atraumatic, pupils are equal, round and reactive to light, extraocular tracking is good without limitation to gaze excursion or nystagmus noted. Hearing is grossly intact. Face is symmetric with normal facial animation. Speech is clear with no dysarthria noted. There is no hypophonia. There is no lip, neck/head, jaw or voice tremor. Neck is supple with full range of passive and active motion. There are no carotid bruits on auscultation. Oropharynx exam reveals: mild mouth dryness, adequate dental hygiene and mild airway crowding, due to small airway entry, slightly thinner and longer appearing uvula, tonsils absent, Mallampati class II.  Tongue protrudes centrally and palate elevates symmetrically.  Neck circumference of 16-1/2 inches.  She has a minimal overbite.  Chest: Clear to auscultation without wheezing, rhonchi or crackles noted.  Heart: S1+S2+0, regular and normal without murmurs, rubs or gallops noted.   Abdomen: Soft, non-tender and non-distended with normal bowel sounds appreciated on auscultation.  Extremities: There is no pitting edema in the distal lower extremities bilaterally.   Skin: Warm and dry without trophic changes noted.    Musculoskeletal: exam reveals no obvious joint deformities, tenderness or joint swelling or erythema.   Neurologically:  Mental status: The patient is awake, alert and oriented in all 4 spheres. Her immediate and remote memory, attention, language skills and fund of knowledge are appropriate. There is no evidence of aphasia, agnosia, apraxia or anomia. Speech is clear with normal prosody and enunciation. Thought process is linear. Mood is normal and affect is normal.  Cranial nerves II - XII are as described above under HEENT exam.  Motor exam: Normal bulk, strength and tone is noted. There is no tremor, Romberg is negative. Fine motor skills and coordination: grossly intact.  Cerebellar testing: No dysmetria or intention tremor. There is no truncal or gait ataxia.  Sensory exam: intact to light touch in the upper and lower extremities.  Gait, station and balance: She stands easily. No veering to one side is noted. No leaning to one side is noted. Posture is age-appropriate and stance is narrow based. Gait shows normal stride length and normal pace. No problems turning are noted. Tandem walk is difficult for her, even with shoes off.    Assessment and Plan:  In summary, JAIRA CANADY is a very pleasant 69 y.o.-year old female with an underlying medical history of hyperlipidemia, and borderline obesity, whose history and physical exam are concerning for obstructive sleep apnea (OSA). I had a long chat with the patient about my findings and the diagnosis of OSA, its prognosis and treatment options. We talked about medical treatments, surgical interventions and non-pharmacological approaches. I explained in particular the risks and ramifications of untreated moderate to severe OSA, especially with respect to developing cardiovascular disease down the Road, including congestive heart failure, difficult to treat hypertension, cardiac arrhythmias, or stroke. Even type 2 diabetes has, in part, been linked  to untreated OSA. Symptoms of untreated OSA include daytime sleepiness, memory problems, mood irritability and mood disorder such as depression and anxiety, lack of energy, as well as recurrent headaches, especially morning headaches. We talked about trying to maintain a healthy lifestyle in general, as well as the importance of weight control. We also talked about the importance  of good sleep hygiene. I recommended the following at this time: sleep study.  I explained the sleep test procedure to the patient and also outlined possible surgical and non-surgical treatment options of OSA, including the use of a custom-made dental device (which would require a referral to a specialist dentist or oral surgeon), upper airway surgical options, such as traditional UPPP or a novel less invasive surgical option in the form of Inspire hypoglossal nerve stimulation (which would involve a referral to an ENT surgeon). I also explained the CPAP treatment option to the patient, who indicated that she would be willing to try CPAP if the need arises.  I answered all her questions today and the patient was in agreement. I plan to see her back after the sleep study is completed and encouraged her to call with any interim questions, concerns, problems or updates.   Thank you very much for allowing me to participate in the care of this nice patient. If I can be of any further assistance to you please do not hesitate to call me at [phone].  Sincerely,   Star Age, MD, PhD

## 2019-12-03 ENCOUNTER — Telehealth: Payer: Self-pay

## 2019-12-03 NOTE — Telephone Encounter (Signed)
LVM to schedule HST °

## 2019-12-23 ENCOUNTER — Ambulatory Visit (INDEPENDENT_AMBULATORY_CARE_PROVIDER_SITE_OTHER): Payer: Medicare HMO | Admitting: Neurology

## 2019-12-23 ENCOUNTER — Other Ambulatory Visit: Payer: Self-pay

## 2019-12-23 DIAGNOSIS — G4733 Obstructive sleep apnea (adult) (pediatric): Secondary | ICD-10-CM

## 2019-12-23 DIAGNOSIS — R0683 Snoring: Secondary | ICD-10-CM

## 2019-12-23 DIAGNOSIS — E669 Obesity, unspecified: Secondary | ICD-10-CM

## 2019-12-23 DIAGNOSIS — R351 Nocturia: Secondary | ICD-10-CM

## 2019-12-23 DIAGNOSIS — R0681 Apnea, not elsewhere classified: Secondary | ICD-10-CM

## 2019-12-23 DIAGNOSIS — R519 Headache, unspecified: Secondary | ICD-10-CM

## 2019-12-27 ENCOUNTER — Other Ambulatory Visit: Payer: Self-pay | Admitting: Family Medicine

## 2019-12-27 DIAGNOSIS — E785 Hyperlipidemia, unspecified: Secondary | ICD-10-CM

## 2019-12-30 ENCOUNTER — Other Ambulatory Visit: Payer: Self-pay | Admitting: Family Medicine

## 2019-12-30 DIAGNOSIS — E785 Hyperlipidemia, unspecified: Secondary | ICD-10-CM

## 2020-01-04 ENCOUNTER — Telehealth: Payer: Self-pay

## 2020-01-04 NOTE — Telephone Encounter (Signed)
-----   Message from Star Age, MD sent at 01/04/2020 12:31 PM EST ----- Patient referred by Dr. Lajuana Ripple, seen by me on 928/21, HST on 12/23/19.    Please call and notify the patient that the recent home sleep test showed obstructive sleep apnea in the moderate range. I recommend treatment in the form of autoPAP at home, through a DME company (of her choice, or as per insurance requirement). The DME representative will educate her on how to use the machine, how to put the mask on, etc. I have placed an order in the chart. Please send referral, talk to patient, send report to referring MD. We will need a FU in sleep clinic for 10 weeks post-PAP set up, please arrange that with me or one of our NPs. Thanks,   Star Age, MD, PhD [facility])

## 2020-01-04 NOTE — Progress Notes (Signed)
Patient referred by Dr. Lajuana Ripple, seen by me on 928/21, HST on 12/23/19.    Please call and notify the patient that the recent home sleep test showed obstructive sleep apnea in the moderate range. I recommend treatment in the form of autoPAP at home, through a DME company (of her choice, or as per insurance requirement). The DME representative will educate her on how to use the machine, how to put the mask on, etc. I have placed an order in the chart. Please send referral, talk to patient, send report to referring MD. We will need a FU in sleep clinic for 10 weeks post-PAP set up, please arrange that with me or one of our NPs. Thanks,   Star Age, MD, PhD [facility])

## 2020-01-04 NOTE — Procedures (Signed)
   [facility]  HOME SLEEP TEST (Watch PAT)  STUDY DATE: 12/23/19  DOB: 02-Oct-1950  MRN: [id number]  ORDERING CLINICIAN: Star Age, MD, PhD   REFERRING CLINICIAN: [name], DO   CLINICAL INFORMATION/HISTORY: 69 year old right-handed woman with an underlying medical history of hyperlipidemia, and borderline obesity, who reports snoring and occasional morning headaches.   Epworth sleepiness score: 3/24.  BMI: 31.2 kg/m  FINDINGS:   Total Record Time (hours, min): 8h 28min  Total Sleep Time (hours, min):  7h 75min   Percent REM (%):    21.2   Calculated pAHI (per hour):  26.4      REM pAHI:    50.0     NREM pAHI: 20.4   Oxygen Saturation (%) Mean: 94 Minimum oxygen saturation (%):              73   O2 Saturation Range (%): 73 -99  O2Saturation (minutes) <=88%: 2.5  Pulse Mean (bpm):    77  Pulse Range (53 - 122)   IMPRESSION: OSA (obstructive sleep apnea), moderate   RECOMMENDATION:  This home sleep test demonstrates moderate obstructive sleep apnea with a total AHI of 26.4/hour and O2 nadir of 73%. Treatment with positive airway pressure is recommended. The patient will be advised to proceed with an autoPAP titration/trial at home for now. A full night titration study may be considered to optimize treatment settings, if needed down the road. Please note that untreated obstructive sleep apnea may carry additional perioperative morbidity. Patients with significant obstructive sleep apnea should receive perioperative PAP therapy and the surgeons and particularly the anesthesiologist should be informed of the diagnosis and the severity of the sleep disordered breathing. The patient should be cautioned not to drive, work at heights, or operate dangerous or heavy equipment when tired or sleepy. Review and reiteration of good sleep hygiene measures should be pursued with any patient. Other causes of the patient's symptoms, including circadian rhythm  disturbances, an underlying mood disorder, medication effect and/or an underlying medical problem cannot be ruled out based on this test. Clinical correlation is recommended. The patient and her referring provider will be notified of the test results. The patient will be seen in follow up in sleep clinic at [facility].  I certify that I have reviewed the raw data recording prior to the issuance of this report in accordance with the standards of the American Academy of Sleep Medicine (AASM).   INTERPRETING PHYSICIAN:    Star Age, MD, PhD  Board Certified in Neurology and Sleep Medicine  [facility] [address], [place], [place] [address] [phone]

## 2020-01-04 NOTE — Addendum Note (Signed)
Addended by: Star Age on: 01/04/2020 12:32 PM   Modules accepted: Orders

## 2020-01-04 NOTE — Telephone Encounter (Signed)
I called pt. I advised pt that Dr. Rexene Alberts reviewed their sleep study results and found that pt has moderate osa. Dr. Rexene Alberts recommends that pt start an auto pap at home. I reviewed PAP compliance expectations with the pt. Pt is agreeable to starting an auto-PAP. I advised pt that an order will be sent to a DME, [facility], and Huey Romans will call the pt within about one week after they file with the pt's insurance. Huey Romans will show the pt how to use the machine, fit for masks, and troubleshoot the auto-PAP if needed. A follow up appt was made for insurance purposes with Dr. Rexene Alberts on 04/12/20 at 10:30am. Pt verbalized understanding to arrive 15 minutes early and bring their auto-PAP. A letter with all of this information in it will be mailed to the pt as a reminder. I verified with the pt that the address we have on file is correct. Pt verbalized understanding of results. Pt had no questions at this time but was encouraged to call back if questions arise. I have sent the order to [facility] and have received confirmation that they have received the order.

## 2020-01-14 ENCOUNTER — Other Ambulatory Visit: Payer: Self-pay

## 2020-01-14 DIAGNOSIS — Z20822 Contact with and (suspected) exposure to covid-19: Secondary | ICD-10-CM

## 2020-01-15 LAB — SPECIMEN STATUS REPORT

## 2020-01-15 LAB — SARS-COV-2, NAA 2 DAY TAT

## 2020-01-15 LAB — NOVEL CORONAVIRUS, NAA: SARS-CoV-2, NAA: NOT DETECTED

## 2020-02-16 ENCOUNTER — Other Ambulatory Visit: Payer: Self-pay

## 2020-02-16 ENCOUNTER — Ambulatory Visit (INDEPENDENT_AMBULATORY_CARE_PROVIDER_SITE_OTHER): Payer: Medicare HMO | Admitting: Family Medicine

## 2020-02-16 ENCOUNTER — Encounter: Payer: Self-pay | Admitting: Family Medicine

## 2020-02-16 VITALS — BP 137/80 | HR 97 | Temp 98.2°F | Ht 62.0 in | Wt 170.6 lb

## 2020-02-16 DIAGNOSIS — M545 Low back pain, unspecified: Secondary | ICD-10-CM

## 2020-02-16 LAB — URINALYSIS, COMPLETE
Bilirubin, UA: NEGATIVE
Glucose, UA: NEGATIVE
Ketones, UA: NEGATIVE
Leukocytes,UA: NEGATIVE
Nitrite, UA: NEGATIVE
Protein,UA: NEGATIVE
RBC, UA: NEGATIVE
Specific Gravity, UA: 1.025 (ref 1.005–1.030)
Urobilinogen, Ur: 0.2 mg/dL (ref 0.2–1.0)
pH, UA: 5.5 (ref 5.0–7.5)

## 2020-02-16 LAB — MICROSCOPIC EXAMINATION
RBC, Urine: NONE SEEN /hpf (ref 0–2)
WBC, UA: NONE SEEN /hpf (ref 0–5)

## 2020-02-16 MED ORDER — TIZANIDINE HCL 4 MG PO TABS: 2.0000 mg | ORAL_TABLET | Freq: Three times a day (TID) | ORAL | 0 refills | Status: DC | PRN

## 2020-02-16 MED ORDER — PREDNISONE 10 MG (21) PO TBPK: ORAL_TABLET | ORAL | 0 refills | Status: DC

## 2020-02-16 MED ORDER — METHYLPREDNISOLONE ACETATE 40 MG/ML IJ SUSP: 40.0000 mg | Freq: Once | INTRAMUSCULAR | Status: DC

## 2020-02-16 NOTE — Progress Notes (Signed)
Subjective: CC: back pain PCP: [name], DO HPI: Patient is a 69 y.o. female presenting to clinic today for back pain. Concerns today include:  1. Back Pain Patient reports that low back pain began 2 days ago.  Denies h/o back pain.   It does radiate down the leg on the right side but not the left.  Flexion/ sitting worsens pain.  Nothing improves pain.  Patient has been taking Tylenol sinus for pain with no relief.  Patient denies trauma or injury.  Denies dysuria, fevers, chills, nausea, vomiting, abdominal pain, renal stones.   Denies saddle anesthesia, urinary retention/incontinence, bowel incontinence, weakness, falls, sensation changes or pain anywhere else. Denies h/o back surgeries. ?hematuria.  No h/o renal stones.   Current Outpatient Medications:    Cholecalciferol (VITAMIN D3 SUPER STRENGTH) 2000 UNITS TABS, Take 1 tablet by mouth daily., Disp: , Rfl:    fenofibrate 160 MG tablet, TAKE 1 TABLET (160 MG TOTAL) DAILY BY MOUTH., Disp: 90 tablet, Rfl: 1   fluticasone (FLONASE) 50 MCG/ACT nasal spray, PLACE 1 SPRAY INTO BOTH NOSTRILS 2 (TWO) TIMES DAILY AS NEEDED FOR ALLERGIES OR RHINITIS., Disp: 48 mL, Rfl: 5   Omega-3 Fatty Acids (OMEGA 3 PO), Take by mouth., Disp: , Rfl:    rosuvastatin (CRESTOR) 10 MG tablet, TAKE 1 TABLET BY MOUTH EVERY DAY, Disp: 90 tablet, Rfl: 1   zolpidem (AMBIEN) 10 MG tablet, Take 0.5-1 tablets (5-10 mg total) by mouth at bedtime as needed for sleep., Disp: 30 tablet, Rfl: 1 No Known Allergies  Past Medical History:  Diagnosis Date   Complication of anesthesia    Hyperlipidemia    PONV (postoperative nausea and vomiting)    Social History   Socioeconomic History   Marital status: Married    Spouse name: Charles   Number of children: 2   Years of education: 12   Highest education level: High school graduate  Occupational History   Occupation: retired  Tobacco Use   Smoking status: Never Smoker   Smokeless  tobacco: Never Used  Scientific laboratory technician Use: Never used  Substance and Sexual Activity   Alcohol use: No   Drug use: No   Sexual activity: Yes    Birth control/protection: Post-menopausal  Other Topics Concern   Not on file  Social History Narrative   Not on file   Social Determinants of Health   Financial Resource Strain: Not on file  Food Insecurity: Not on file  Transportation Needs: Not on file  Physical Activity: Not on file  Stress: Not on file  Social Connections: Not on file  Intimate Partner Violence: Not on file   Past Surgical History:  Procedure Laterality Date   CHOLECYSTECTOMY  04/13/2011   Procedure: LAPAROSCOPIC CHOLECYSTECTOMY;  Surgeon: Donato Heinz, MD;  Location: [facility];  Service: General;  Laterality: N/A;   [place]  02/2018   mole removed from forehead     ROS: per HPI  Objective: Office vital signs reviewed. BP 137/80    Pulse 97    Temp 98.2 F (36.8 C)    Ht 5\' 2"  (1.575 m)    Wt 170 lb 9.6 oz (77.4 kg)    SpO2 96%    BMI 31.20 kg/m   Physical Examination:  General: Awake, alert, well nourished, NAD Cardio: Regular rate and rhythm, S1S2 heard, no murmurs appreciated Pulm: Clear to auscultation bilaterally, no wheezes, rhonchi  or rales Extremities: Warm, well-perfused. No edema, cyanosis or clubbing; +2 pulses bilaterally GU: No CVA tenderness palpation MSK: normal gait and station  Lumbar Spine: AROM limited by pain, no midline tenderness to palpation, no paraspinal tenderness to palpation.  No palpable bony deformities, +mild pain with straight leg test on the right Neuro: 5/5 lower extremity strength; lower extremity light touch sensation grossly intact   Assessment/ Plan: SHAKA ZECH is a 69 y.o. female here with  Acute left-sided low back pain without sciatica - Plan: Urinalysis, Complete, tiZANidine (ZANAFLEX) 4 MG tablet, predniSONE (STERAPRED UNI-PAK 21 TAB) 10 MG  (21) TBPK tablet, DISCONTINUED: methylPREDNISolone acetate (DEPO-MEDROL) injection 40 mg  Urinalysis did not show evidence of infection.  Suspect this is low back spasm.  Prednisone Dosepak prescribed (Medrol offered but she declined shot).  Muscle relaxer also sent.  Reinforced need to use Tylenol, heat and stretches.  Home physical therapy exercises provided.  She understands reasons for reevaluation.  She will follow-up as needed   [name], [place]

## 2020-02-16 NOTE — Patient Instructions (Signed)
Heat, tylenol, stretches. You are being prescribed a muscle relaxer (it causes sleepiness so be careful) You were given a prednisone shot today to help with inflammation. I have sent your urine off to be checked for infection.

## 2020-02-18 ENCOUNTER — Other Ambulatory Visit: Payer: Self-pay | Admitting: Family Medicine

## 2020-02-18 DIAGNOSIS — M545 Low back pain, unspecified: Secondary | ICD-10-CM

## 2020-03-08 ENCOUNTER — Telehealth: Payer: Self-pay | Admitting: Neurology

## 2020-03-08 NOTE — Telephone Encounter (Signed)
Noted thank you

## 2020-03-08 NOTE — Telephone Encounter (Signed)
FYI: Pt called, calling to cancel 2/15 appt. Called DME company, will be 8-12 weeks before I can get my CPAP machine. I will call back after I get my CPAP machine to reschedule.

## 2020-04-12 ENCOUNTER — Ambulatory Visit: Payer: Self-pay | Admitting: Neurology

## 2020-04-23 ENCOUNTER — Other Ambulatory Visit: Payer: Self-pay | Admitting: Family Medicine

## 2020-04-23 DIAGNOSIS — E785 Hyperlipidemia, unspecified: Secondary | ICD-10-CM

## 2020-05-06 ENCOUNTER — Ambulatory Visit (INDEPENDENT_AMBULATORY_CARE_PROVIDER_SITE_OTHER): Payer: Medicare HMO | Admitting: Family Medicine

## 2020-05-06 ENCOUNTER — Ambulatory Visit (INDEPENDENT_AMBULATORY_CARE_PROVIDER_SITE_OTHER): Payer: Medicare HMO

## 2020-05-06 ENCOUNTER — Other Ambulatory Visit: Payer: Self-pay

## 2020-05-06 ENCOUNTER — Telehealth: Payer: Self-pay | Admitting: *Deleted

## 2020-05-06 VITALS — BP 139/79 | HR 82 | Temp 97.1°F | Ht 62.0 in | Wt 167.0 lb

## 2020-05-06 DIAGNOSIS — M5441 Lumbago with sciatica, right side: Secondary | ICD-10-CM | POA: Diagnosis not present

## 2020-05-06 DIAGNOSIS — N1831 Chronic kidney disease, stage 3a: Secondary | ICD-10-CM | POA: Insufficient documentation

## 2020-05-06 DIAGNOSIS — U099 Post covid-19 condition, unspecified: Secondary | ICD-10-CM | POA: Diagnosis not present

## 2020-05-06 DIAGNOSIS — R053 Chronic cough: Secondary | ICD-10-CM | POA: Diagnosis not present

## 2020-05-06 DIAGNOSIS — E119 Type 2 diabetes mellitus without complications: Secondary | ICD-10-CM | POA: Diagnosis not present

## 2020-05-06 DIAGNOSIS — F5101 Primary insomnia: Secondary | ICD-10-CM

## 2020-05-06 DIAGNOSIS — R7309 Other abnormal glucose: Secondary | ICD-10-CM | POA: Diagnosis not present

## 2020-05-06 DIAGNOSIS — G8929 Other chronic pain: Secondary | ICD-10-CM

## 2020-05-06 DIAGNOSIS — M545 Low back pain, unspecified: Secondary | ICD-10-CM | POA: Diagnosis not present

## 2020-05-06 DIAGNOSIS — R69 Illness, unspecified: Secondary | ICD-10-CM | POA: Diagnosis not present

## 2020-05-06 LAB — BAYER DCA HB A1C WAIVED: HB A1C (BAYER DCA - WAIVED): 7.3 % — ABNORMAL HIGH (ref <7.0)

## 2020-05-06 MED ORDER — ZOLPIDEM TARTRATE 10 MG PO TABS: 5.0000 mg | ORAL_TABLET | Freq: Every evening | ORAL | 1 refills | Status: DC | PRN

## 2020-05-06 MED ORDER — BENZONATATE 100 MG PO CAPS: 100.0000 mg | ORAL_CAPSULE | Freq: Three times a day (TID) | ORAL | 2 refills | Status: DC | PRN

## 2020-05-06 MED ORDER — LIDOCAINE 5 % EX PTCH: 1.0000 | MEDICATED_PATCH | CUTANEOUS | 99 refills | Status: DC

## 2020-05-06 NOTE — Progress Notes (Signed)
Subjective: CC: Insomnia, back pain PCP: [name], DO DJT:TSVXB Nichole Cunningham is a 70 y.o. female presenting to clinic today for:  1.  Insomnia Patient with sparing use of Ambien.  Her last prescription has now expired and she was unable to get the renewal.  I will also refill.  No excessive daytime sedation, falls, respiratory depression, visual auditory hallucinations  2.  Back pain Patient with history of left-sided back pain but notes that on her right side she started to have a flare.  She does report it radiating down the right lower extremity.  Does not report any preceding injury, falls.  Tried at the tizanidine but did not find this to be helpful.  Unable to take NSAIDs due to CKD 3.  Has not really performed any of the back exercises given for the last time that she hurt her back  3.  Elevated A1c level/CKD 3 a Patient noted to have an A1c of 6.8 last visit.  No formal diagnosis of diabetes was made in fact we wanted to wait until today's visit to recheck.  She admits that she does eat as well as she should.  She often will eat bread.  Limits sugary beverages.  Exercises intermittently with some of her [employer] friends at a church.  No reports of chest pain, shortness of breath.  Medical history significant for CKD 3 a  ROS: Per HPI  No Known Allergies Past Medical History:  Diagnosis Date  . Complication of anesthesia   . Hyperlipidemia   . PONV (postoperative nausea and vomiting)     Current Outpatient Medications:  .  Cholecalciferol 50 MCG (2000 UT) TABS, Take 1 tablet by mouth daily., Disp: , Rfl:  .  fenofibrate 160 MG tablet, TAKE 1 TABLET (160 MG TOTAL) DAILY BY MOUTH. (NEEDS TO BE SEEN BEFORE NEXT REFILL), Disp: 30 tablet, Rfl: 0 .  fluticasone (FLONASE) 50 MCG/ACT nasal spray, PLACE 1 SPRAY INTO BOTH NOSTRILS 2 (TWO) TIMES DAILY AS NEEDED FOR ALLERGIES OR RHINITIS., Disp: 48 mL, Rfl: 5 .  Omega-3 Fatty Acids (OMEGA 3 PO), Take by mouth., Disp: , Rfl:  .   rosuvastatin (CRESTOR) 10 MG tablet, TAKE 1 TABLET BY MOUTH EVERY DAY, Disp: 90 tablet, Rfl: 1 .  zolpidem (AMBIEN) 10 MG tablet, Take 0.5-1 tablets (5-10 mg total) by mouth at bedtime as needed for sleep., Disp: 30 tablet, Rfl: 1 Social History   Socioeconomic History  . Marital status: Married    Spouse name: [name]  . Number of children: 2  . Years of education: 34  . Highest education level: High school graduate  Occupational History  . Occupation: retired  Tobacco Use  . Smoking status: Never Smoker  . Smokeless tobacco: Never Used  Vaping Use  . Vaping Use: Never used  Substance and Sexual Activity  . Alcohol use: No  . Drug use: No  . Sexual activity: Yes    Birth control/protection: Post-menopausal  Other Topics Concern  . Not on file  Social History Narrative  . Not on file   Social Determinants of Health   Financial Resource Strain: Not on file  Food Insecurity: Not on file  Transportation Needs: Not on file  Physical Activity: Not on file  Stress: Not on file  Social Connections: Not on file  Intimate Partner Violence: Not on file   Family History  Problem Relation Age of Onset  . Breast cancer Maternal Aunt   . Cancer Mother   . Heart  disease Father   . Heart attack Father        cause of death  . Cancer Sister   . Colon cancer Sister        dx 2020  . Cancer Brother        metastatic w/ uncertain primary dx 6333  . Anesthesia problems Neg Hx   . Hypotension Neg Hx   . Malignant hyperthermia Neg Hx   . Pseudochol deficiency Neg Hx   . Stomach cancer Neg Hx     Objective: Office vital signs reviewed. BP 139/79   Pulse 82   Temp (!) 97.1 F (36.2 C) (Temporal)   Ht 5\' 2"  (1.575 m)   Wt 167 lb (75.8 kg)   SpO2 95%   BMI 30.54 kg/m   Physical Examination:  General: Awake, alert, well nourished, No acute distress Cardio: regular rate and rhythm, S1S2 heard, no murmurs appreciated Pulm: clear to auscultation bilaterally, no wheezes,  rhonchi or rales; normal work of breathing on room air Extremities: warm, well perfused, No edema, cyanosis or clubbing; +2 pulses bilaterally MSK: Stiff gait and normal station  Lumbar spine: No midline tenderness palpation.  No paraspinal muscle tenderness to palpation.  She does have tenderness to palpation over the right sacroiliac joint.  No palpable bony abnormalities.  Negative straight leg raise. Skin: dry; intact; no rashes or lesions Neuro: 5/5 LE Strength and light touch sensation grossly intact, patellar DTRs 2/4  Assessment/ Plan: 71 y.o. female   Primary insomnia - Plan: zolpidem (AMBIEN) 10 MG tablet  New onset type 2 diabetes mellitus ([place]) - Plan: Bayer DCA Hb A1c Waived, Referral to Nutrition and Diabetes Services  Chronic kidney disease, stage 3a ([place]) - Plan: Renal Function Panel, VITAMIN D 25 Hydroxy (Vit-D Deficiency, Fractures), Referral to Nutrition and Diabetes Services  Post-COVID chronic cough  Chronic right-sided low back pain with right-sided sciatica - Plan: DG Lumbar Spine 2-3 Views, lidocaine (LIDODERM) 5 %  Insomnia stable.  Very sparing use of Ambien.  National narcotic database was reviewed and there were no red flags.  A1c up to 7.3 today.  Patient is with new diagnosis of type 2 diabetes.  I am placing a referral to nutrition in efforts to get her sugar down naturally.  Would like them to specifically focus on carb counting, increase physical exercise, prevention and disease management.  A glucose monitor also be sent to her pharmacy  Avoid NSAIDs.  Hydrate adequately given CKD 3.  Plan for urine micro/creatinine at next visit  Tessalon Perles for post Covid chronic cough.  Her lung exam was unremarkable  Lidocaine patches sent for back pain.  Offered injection and referral today but she declined this  No orders of the defined types were placed in this encounter.  No orders of the defined types were placed in this encounter.    [name], DO [place] [phone]

## 2020-05-06 NOTE — Telephone Encounter (Signed)
PA denied for zolpidem patient must fail Doxepin 8m or 6mg  first.  PA denied for lidocaine 5% patches -Your plan does not allow coverage of this medication based on your prescriber answering No to the following  question(s): Is the requested drug being prescribed for any of the following: A) Pain associated with post-herpetic  neuralgia, B) Pain associated with diabetic neuropathy, C) Pain associated with cancer-related neuropathy  (including treatment-related neuropathy [e.g., neuropathy associated with radiation treatment or  chemotherapy])?

## 2020-05-06 NOTE — Telephone Encounter (Signed)
Ok to change to Doxepin 3mg  QHS (would avoid higher doses due to BEERS criteria in elderly), if pt willing to switch. Otherwise, she will have to pay out of pocket

## 2020-05-06 NOTE — Telephone Encounter (Signed)
PA in process for zolpidem and lidocaine patches   Key: [id number] - PA Case ID: [id number] - Rx #: [id number] Status Sent to [facility] Zolpidem Tartrate 10MG  tablets   Key: [id number] - PA Case ID: [id number] - Rx #: [id number] Lidocaine 5% patches

## 2020-05-06 NOTE — Patient Instructions (Addendum)
Your last sugar showed DIABETES range.  I am rechecking this today.  If it is high again, we will need to discuss Diabetic medication/ treatments/ monitoring.  You had labs performed today.  You will be contacted with the results of the labs once they are available, usually in the next 3 business days for routine lab work.  If you have an active my chart account, they will be released to your MyChart.  If you prefer to have these labs released to you via telephone, please let us know.  If you had a pap smear or biopsy performed, expect to be contacted in about 7-10 days.

## 2020-05-07 LAB — RENAL FUNCTION PANEL
Albumin: 4.8 g/dL (ref 3.8–4.8)
BUN/Creatinine Ratio: 19 (ref 12–28)
BUN: 19 mg/dL (ref 8–27)
CO2: 22 mmol/L (ref 20–29)
Calcium: 10.1 mg/dL (ref 8.7–10.3)
Chloride: 105 mmol/L (ref 96–106)
Creatinine, Ser: 1 mg/dL (ref 0.57–1.00)
Glucose: 108 mg/dL — ABNORMAL HIGH (ref 65–99)
Phosphorus: 3.8 mg/dL (ref 3.0–4.3)
Potassium: 4.6 mmol/L (ref 3.5–5.2)
Sodium: 142 mmol/L (ref 134–144)
eGFR: 61 mL/min/{1.73_m2} (ref >59)

## 2020-05-07 LAB — VITAMIN D 25 HYDROXY (VIT D DEFICIENCY, FRACTURES): Vit D, 25-Hydroxy: 27.9 ng/mL — ABNORMAL LOW (ref 30.0–100.0)

## 2020-05-09 DIAGNOSIS — Z6830 Body mass index (BMI) 30.0-30.9, adult: Secondary | ICD-10-CM | POA: Diagnosis not present

## 2020-05-09 DIAGNOSIS — Z1231 Encounter for screening mammogram for malignant neoplasm of breast: Secondary | ICD-10-CM | POA: Diagnosis not present

## 2020-05-09 DIAGNOSIS — Z01419 Encounter for gynecological examination (general) (routine) without abnormal findings: Secondary | ICD-10-CM | POA: Diagnosis not present

## 2020-05-09 DIAGNOSIS — Z124 Encounter for screening for malignant neoplasm of cervix: Secondary | ICD-10-CM | POA: Diagnosis not present

## 2020-05-09 MED ORDER — DOXEPIN HCL 3 MG PO TABS: 3.0000 mg | ORAL_TABLET | Freq: Every day | ORAL | 0 refills | Status: DC

## 2020-05-09 NOTE — Telephone Encounter (Signed)
Pt aware and will try Doxepin, called in

## 2020-05-17 ENCOUNTER — Other Ambulatory Visit: Payer: Self-pay | Admitting: Family Medicine

## 2020-05-17 DIAGNOSIS — E785 Hyperlipidemia, unspecified: Secondary | ICD-10-CM

## 2020-05-26 DIAGNOSIS — G4733 Obstructive sleep apnea (adult) (pediatric): Secondary | ICD-10-CM | POA: Diagnosis not present

## 2020-06-08 ENCOUNTER — Encounter: Payer: Medicare HMO | Attending: Family Medicine | Admitting: Nutrition

## 2020-06-08 ENCOUNTER — Other Ambulatory Visit: Payer: Self-pay

## 2020-06-08 ENCOUNTER — Encounter: Payer: Self-pay | Admitting: Nutrition

## 2020-06-08 VITALS — Ht 62.0 in | Wt 164.0 lb

## 2020-06-08 DIAGNOSIS — E785 Hyperlipidemia, unspecified: Secondary | ICD-10-CM | POA: Diagnosis not present

## 2020-06-08 DIAGNOSIS — E1165 Type 2 diabetes mellitus with hyperglycemia: Secondary | ICD-10-CM | POA: Insufficient documentation

## 2020-06-08 DIAGNOSIS — N1831 Chronic kidney disease, stage 3a: Secondary | ICD-10-CM

## 2020-06-08 DIAGNOSIS — Z6829 Body mass index (BMI) 29.0-29.9, adult: Secondary | ICD-10-CM | POA: Insufficient documentation

## 2020-06-08 DIAGNOSIS — E118 Type 2 diabetes mellitus with unspecified complications: Secondary | ICD-10-CM | POA: Diagnosis not present

## 2020-06-08 DIAGNOSIS — IMO0002 Reserved for concepts with insufficient information to code with codable children: Secondary | ICD-10-CM

## 2020-06-08 NOTE — Patient Instructions (Signed)
Goals Measure foods out Don't skip meals Eat meals on time Follow My Plate Method Cut out crystal light and only drink water Start walking 30 minutes 3-4 times per week. Get A1C 5.7%

## 2020-06-08 NOTE — Progress Notes (Signed)
Medical Nutrition Therapy  Appointment Start time:  5852  Appointment End time:  1430  Primary concerns today: Diabetes Type 2  Referral diagnosis: E11.8 Preferred learning style: no preference indicated Learning readiness:  ready   NUTRITION ASSESSMENT  Recently has cut out sweet tea. Has cut down on potatoes and heavy meals. Had been eating ice cream also.   Lab Results  Component Value Date   HGBA1C 7.3 (H) 05/06/2020    Anthropometrics  Wt Readings from Last 3 Encounters:  06/08/20 164 lb (74.4 kg)  05/06/20 167 lb (75.8 kg)  02/16/20 170 lb 9.6 oz (77.4 kg)   Ht Readings from Last 3 Encounters:  06/08/20 5\' 2"  (1.575 m)  05/06/20 5\' 2"  (1.575 m)  02/16/20 5\' 2"  (1.575 m)   Body mass index is 30 kg/m. @BMIFA @ Facility age limit for growth percentiles is 20 years. Facility age limit for growth percentiles is 20 years.    Clinical Medical Hx: Hyperlipidemia, Medications: see chart Labs:  Lab Results  Component Value Date   HGBA1C 7.3 (H) 05/06/2020   CMP Latest Ref Rng & Units 05/06/2020 11/05/2019 04/22/2019  Glucose 65 - 99 mg/dL 108(H) 123(H) 119(H)  BUN 8 - 27 mg/dL 19 21 18   Creatinine 0.57 - 1.00 mg/dL 1.00 1.02(H) 1.09(H)  Sodium 134 - 144 mmol/L 142 141 142  Potassium 3.5 - 5.2 mmol/L 4.6 5.0 4.3  Chloride 96 - 106 mmol/L 105 104 106  CO2 20 - 29 mmol/L 22 24 20   Calcium 8.7 - 10.3 mg/dL 10.1 9.9 9.6  Total Protein 6.0 - 8.5 g/dL - - 6.7  Total Bilirubin 0.0 - 1.2 mg/dL - - 0.6  Alkaline Phos 39 - 117 IU/L - - 65  AST 0 - 40 IU/L - - 27  ALT 0 - 32 IU/L - - 27   Lipid Panel     Component Value Date/Time   CHOL 143 11/05/2019 0812   TRIG 168 (H) 11/05/2019 0812   HDL 41 11/05/2019 0812   CHOLHDL 3.5 11/05/2019 0812   LDLCALC 73 11/05/2019 0812   LDLDIRECT 153 (H) 05/25/2015 0950   LABVLDL 29 11/05/2019 0812    Notable Signs/Symptoms:   Lifestyle & Dietary Hx Lives with her husband. She does the cooking and shopping in the  home.  Estimated daily fluid intake:  oz Supplements: Omega 3 Sleep: good  Stress / self-care:  Current average weekly physical activity: ADL   24-Hr Dietary Recall First Meal: Bacon 4-5 slices, egg, water with crystal light Snack:  Second Meal: skips a lot; chicken, water Snack:  Third Meal: Grilled chicken salad with [facility], water with crystal light Snack: Coggins bar or protein bars Beverages: water with crystal light  Estimated Energy Needs Calories: 1500 Carbohydrate: 170g Protein: 112g Fat: 42g   NUTRITION DIAGNOSIS  NB-1.1 Food and nutrition-related knowledge deficit As related to Diabetes Type 2.  As evidenced by A1C 7.3%.   NUTRITION INTERVENTION  Nutrition education (E-1) on the following topics:  . Nutrition and Diabetes education provided on My Plate, CHO counting, meal planning, portion sizes, timing of meals, avoiding snacks between meals unless having a low blood sugar, target ranges for A1C and blood sugars, signs/symptoms and treatment of hyper/hypoglycemia, monitoring blood sugars, taking medications as prescribed, benefits of exercising 30 minutes per day and prevention of complications of DM. Marland Kitchen   Handouts Provided Include   The Plate Method   Meal Plan Card  Diabetes  Learning Style & Readiness for Change Teaching  method utilized: Optician, dispensing  Demonstrated degree of understanding via: Teach Back  Barriers to learning/adherence to lifestyle change: None  Goals Established by Pt Goals Measure foods out Don't skip meals Eat meals on time Follow My Plate Method Cut out crystal light and only drink water Start walking 30 minutes 3-4 times per week. Get A1C 5.7% .    MONITORING & EVALUATION Dietary intake, weekly physical activity, and blood sugars in 1 month.  Next Steps  Patient is to work on meal planning.

## 2020-06-25 DIAGNOSIS — G4733 Obstructive sleep apnea (adult) (pediatric): Secondary | ICD-10-CM | POA: Diagnosis not present

## 2020-06-28 ENCOUNTER — Encounter: Payer: Self-pay | Admitting: Nutrition

## 2020-06-28 DIAGNOSIS — L820 Inflamed seborrheic keratosis: Secondary | ICD-10-CM | POA: Diagnosis not present

## 2020-06-28 DIAGNOSIS — Z85828 Personal history of other malignant neoplasm of skin: Secondary | ICD-10-CM | POA: Diagnosis not present

## 2020-06-28 DIAGNOSIS — L918 Other hypertrophic disorders of the skin: Secondary | ICD-10-CM | POA: Diagnosis not present

## 2020-06-28 DIAGNOSIS — Z08 Encounter for follow-up examination after completed treatment for malignant neoplasm: Secondary | ICD-10-CM | POA: Diagnosis not present

## 2020-07-13 ENCOUNTER — Ambulatory Visit: Payer: Medicare HMO | Admitting: Nutrition

## 2020-07-20 ENCOUNTER — Other Ambulatory Visit: Payer: Self-pay | Admitting: Family Medicine

## 2020-07-20 DIAGNOSIS — E785 Hyperlipidemia, unspecified: Secondary | ICD-10-CM

## 2020-07-26 DIAGNOSIS — G4733 Obstructive sleep apnea (adult) (pediatric): Secondary | ICD-10-CM | POA: Diagnosis not present

## 2020-08-04 ENCOUNTER — Other Ambulatory Visit: Payer: Self-pay | Admitting: Family Medicine

## 2020-08-04 ENCOUNTER — Telehealth: Payer: Self-pay | Admitting: Family Medicine

## 2020-08-04 DIAGNOSIS — E785 Hyperlipidemia, unspecified: Secondary | ICD-10-CM

## 2020-08-04 NOTE — Telephone Encounter (Signed)
  Prescription Request  08/04/2020  What is the name of the medication or equipment? fenofibrate  Have you contacted your pharmacy to request a refill? (if applicable) yes, they denied   Which pharmacy would you like this sent to? [facility]   Patient notified that their request is being sent to the clinical staff for review and that they should receive a response within 2 business days.

## 2020-08-05 NOTE — Telephone Encounter (Signed)
Patient aware she has refills at the pharmacy.

## 2020-08-24 ENCOUNTER — Other Ambulatory Visit: Payer: Self-pay

## 2020-08-24 ENCOUNTER — Encounter: Payer: Self-pay | Admitting: Family Medicine

## 2020-08-24 ENCOUNTER — Ambulatory Visit: Payer: Medicare HMO | Admitting: Family Medicine

## 2020-08-24 VITALS — BP 132/88 | HR 72 | Ht 62.0 in | Wt 164.0 lb

## 2020-08-24 DIAGNOSIS — G4733 Obstructive sleep apnea (adult) (pediatric): Secondary | ICD-10-CM | POA: Diagnosis not present

## 2020-08-24 DIAGNOSIS — Z9989 Dependence on other enabling machines and devices: Secondary | ICD-10-CM

## 2020-08-24 NOTE — Patient Instructions (Addendum)
Please continue using your CPAP regularly. While your insurance requires that you use CPAP at least 4 hours each night on 70% of the nights, I recommend, that you not skip any nights and use it throughout the night if you can. Getting used to CPAP and staying with the treatment long term does take time and patience and discipline. Untreated obstructive sleep apnea when it is moderate to severe can have an adverse impact on cardiovascular health and raise her risk for heart disease, arrhythmias, hypertension, congestive heart failure, stroke and diabetes. Untreated obstructive sleep apnea causes sleep disruption, nonrestorative sleep, and sleep deprivation. This can have an impact on your day to day functioning and cause daytime sleepiness and impairment of cognitive function, memory loss, mood disturbance, and problems focussing. Using CPAP regularly can improve these symptoms.  I will send you for a mask refitting with [facility]. If you have not heard back from them in 2-3 days, please reach out with the contact number below.   Huey Romans [phone]  Follow up in 6 months

## 2020-08-24 NOTE — Progress Notes (Addendum)
PATIENT: Nichole Cunningham DOB: 09/24/1950  REASON FOR VISIT: follow up HISTORY FROM: patient  Chief Complaint  Patient presents with   Obstructive Sleep Apnea    Rm 1, alone. Here for cpap f/u, pt states she is still trying to get used to it. No issues with the machine itself.      HISTORY OF PRESENT ILLNESS: 08/24/20 ALL:  Nichole Cunningham is a 70 y.o. female here today for follow up for OSA on CPAP. HST 12/23/2019 Showed moderate OSA with total AHI of 26.4/hr and O2 nadir of 73%. She was started on AutoPAP. She continues to get adjusted to using therapy. Some nights she uses CPAP for 5-6 hours and others she can only tolerate for 2-3 hours. She is unsure if she feels better rested. Headaches are significantly better. She has noted that air leaks around the top of her nasal mask. She reports CPAP was shipped to her and she did not have any education regarding set up or fit of mask. DME: Huey Romans.     HISTORY: (copied from Dr Guadelupe Sabin previous note)  Dear Dr. Lajuana Ripple,   I saw your patient, Nichole Cunningham, upon your kind request in my sleep clinic today for initial consultation of her sleep disorder, in particular, concern for underlying obstructive sleep apnea.  The patient is unaccompanied today.  As you know, Nichole Cunningham is a 70 year old right-handed woman with an underlying medical history of hyperlipidemia, and borderline obesity, who reports snoring and occasional morning headaches. She has had difficulty initiating sleep for which she takes Ambien as needed.  I reviewed your office note from 10/21/2019. Her Epworth sleepiness score is 3/24, fatigue severity score is 16 out of 63.  She reports that her snoring can be loud and disturbing to her husband who has also noted some pauses in her breathing at times.  She has a history of migraines in the past but her morning headaches are more bifrontal, dull and achy, she typically does not have to take any medicine as her headache improves as she  is up but sometimes she takes Tylenol.  She has nocturia once or twice per average night.  She has a TV in the bedroom and typically turns it off before falling asleep.  They have 1 dog in the household who does not sleep in the bedroom with them.  She denies any telltale symptoms of restless leg syndrome or kicking in her sleep.  She takes care of her 65-year-old granddaughter who comes early in the morning, gets dropped off at 5:30 AM.  The patient tries to be in bed between 9 PM and midnight and sometimes when she has trouble falling asleep she will take either 5 mg or 10 mg of Ambien, she may go a whole month without having to take it.  Her typical rise time is between 4 and 5:30 AM.  She has a sister with sleep apnea.  The patient drinks caffeine in the form of tea, 2 glasses/day and [facility], 2 cans/day on average.  She had a tonsillectomy as a child.  She does not drink alcohol or smoke.  She is retired for the past 5 years and reports approximately 20 pounds weight gain since her retirement.   REVIEW OF SYSTEMS: Out of a complete 14 system review of symptoms, the patient complains only of the following symptoms, headaches and all other reviewed systems are negative.  ESS: 4  ALLERGIES: No Known Allergies  HOME MEDICATIONS: Outpatient Medications Prior  to Visit  Medication Sig Dispense Refill   benzonatate (TESSALON PERLES) 100 MG capsule Take 1 capsule (100 mg total) by mouth 3 (three) times daily as needed for cough. 30 capsule 2   Cholecalciferol 50 MCG (2000 UT) TABS Take 1 tablet by mouth daily.     Doxepin HCl 3 MG TABS Take 1 tablet (3 mg total) by mouth at bedtime. 30 tablet 0   fenofibrate 160 MG tablet TAKE 1 TABLET (160 MG TOTAL) DAILY BY MOUTH. 90 tablet 3   fluticasone (FLONASE) 50 MCG/ACT nasal spray PLACE 1 SPRAY INTO BOTH NOSTRILS 2 (TWO) TIMES DAILY AS NEEDED FOR ALLERGIES OR RHINITIS. 48 mL 5   lidocaine (LIDODERM) 5 % Place 1 patch onto the skin daily. Remove & Discard  patch within 12 hours or as directed by MD (for back pain) 30 patch prn   Omega-3 Fatty Acids (OMEGA 3 PO) Take by mouth.     rosuvastatin (CRESTOR) 10 MG tablet TAKE 1 TABLET BY MOUTH EVERY DAY 90 tablet 0   No facility-administered medications prior to visit.    PAST MEDICAL HISTORY: Past Medical History:  Diagnosis Date   Complication of anesthesia    Hyperlipidemia    PONV (postoperative nausea and vomiting)     PAST SURGICAL HISTORY: Past Surgical History:  Procedure Laterality Date   CHOLECYSTECTOMY  04/13/2011   Procedure: LAPAROSCOPIC CHOLECYSTECTOMY;  Surgeon: Donato Heinz, MD;  Location: [facility];  Service: General;  Laterality: N/A;   [place]  2003   [place]   SKIN LESION EXCISION  02/2018   mole removed from forehead     FAMILY HISTORY: Family History  Problem Relation Age of Onset   Breast cancer Maternal Aunt    Cancer Mother    Heart disease Father    Heart attack Father        cause of death   Cancer Sister    Colon cancer Sister        dx 2020   Cancer Brother        metastatic w/ uncertain primary dx 0092   Anesthesia problems Neg Hx    Hypotension Neg Hx    Malignant hyperthermia Neg Hx    Pseudochol deficiency Neg Hx    Stomach cancer Neg Hx     SOCIAL HISTORY: Social History   Socioeconomic History   Marital status: Married    Spouse name: Charles   Number of children: 2   Years of education: 12   Highest education level: High school graduate  Occupational History   Occupation: retired  Tobacco Use   Smoking status: Never   Smokeless tobacco: Never  Scientific laboratory technician Use: Never used  Substance and Sexual Activity   Alcohol use: No   Drug use: No   Sexual activity: Yes    Birth control/protection: Post-menopausal  Other Topics Concern   Not on file  Social History Narrative   Not on file   Social Determinants of Health   Financial Resource Strain: Not on file  Food Insecurity: Not on file  Transportation  Needs: Not on file  Physical Activity: Not on file  Stress: Not on file  Social Connections: Not on file  Intimate Partner Violence: Not on file     PHYSICAL EXAM  Vitals:   08/24/20 1446  BP: 132/88  Pulse: 72  Weight: 164 lb (74.4 kg)  Height: 5\' 2"  (1.575 m)   Body mass index is 30 kg/m.  Generalized:  Well developed, in no acute distress  Cardiology: normal rate and rhythm, no murmur noted Respiratory: clear to auscultation bilaterally  Neurological examination  Mentation: Alert oriented to time, place, history taking. Follows all commands speech and language fluent Cranial nerve II-XII: Pupils were equal round reactive to light. Extraocular movements were full, visual field were full  Motor: The motor testing reveals 5 over 5 strength of all 4 extremities. Good symmetric motor tone is noted throughout.  Gait and station: Gait is normal.    DIAGNOSTIC DATA (LABS, IMAGING, TESTING) - I reviewed patient records, labs, notes, testing and imaging myself where available.  No flowsheet data found.   Lab Results  Component Value Date   WBC 7.3 04/22/2019   HGB 14.4 04/22/2019   HCT 42.5 04/22/2019   MCV 93 04/22/2019   PLT 218 04/22/2019      Component Value Date/Time   NA 142 05/06/2020 1109   K 4.6 05/06/2020 1109   CL 105 05/06/2020 1109   CO2 22 05/06/2020 1109   GLUCOSE 108 (H) 05/06/2020 1109   GLUCOSE 156 (H) 04/01/2011 0706   BUN 19 05/06/2020 1109   CREATININE 1.00 05/06/2020 1109   CALCIUM 10.1 05/06/2020 1109   PROT 6.7 04/22/2019 0929   ALBUMIN 4.8 05/06/2020 1109   AST 27 04/22/2019 0929   ALT 27 04/22/2019 0929   ALKPHOS 65 04/22/2019 0929   BILITOT 0.6 04/22/2019 0929   GFRNONAA 57 (L) 11/05/2019 0812   GFRAA 65 11/05/2019 0812   Lab Results  Component Value Date   CHOL 143 11/05/2019   HDL 41 11/05/2019   LDLCALC 73 11/05/2019   LDLDIRECT 153 (H) 05/25/2015   TRIG 168 (H) 11/05/2019   CHOLHDL 3.5 11/05/2019   Lab Results  Component  Value Date   HGBA1C 7.3 (H) 05/06/2020   No results found for: [id number] Lab Results  Component Value Date   TSH 2.730 04/22/2019     ASSESSMENT AND PLAN 70 y.o. year old female  has a past medical history of Complication of anesthesia, Hyperlipidemia, and PONV (postoperative nausea and vomiting). here with     ICD-10-CM   1. OSA on CPAP  G47.33 For home use only DME continuous positive airway pressure (CPAP)   Z99.89         ROSHAUNDA STARKEY is doing well on CPAP therapy. Compliance report reveals excellent daily compliance with sub optimal 4 hour usage. She has a large air leak. She reports that CPAP was shipped to her and it does not sound like she was instructed on how to place mask and adjust headgear. I have educated her on appropriate fit and to place mask prior to starting therapy. I will also send orders for mask refitting and ask [facility] to educate her on appropriate mask fit. She was encouraged to continue using CPAP nightly and for greater than 4 hours each night. We will update supply orders as indicated. Risks of untreated sleep apnea review and education materials provided. Healthy lifestyle habits encouraged. She will follow up in 6 months, sooner if needed. She verbalizes understanding and agreement with this plan.    Orders Placed This Encounter  Procedures   For home use only DME continuous positive airway pressure (CPAP)    Mask refitting    Order Specific Question:   Length of Need    Answer:   Lifetime    Order Specific Question:   Patient has OSA or probable OSA    Answer:   Yes  Order Specific Question:   Is the patient currently using CPAP in the home    Answer:   Yes    Order Specific Question:   Settings    Answer:   Other see comments    Order Specific Question:   CPAP supplies needed    Answer:   Mask, headgear, cushions, filters, heated tubing and water chamber      No orders of the defined types were placed in this encounter.     Debbora Presto,  FNP-C 08/24/2020, 3:25 PM [facility] [address] [place], [place] [address] [phone]  I reviewed the above note and documentation by the Nurse Practitioner and agree with the history, exam, assessment and plan as outlined above. I was available for consultation. Star Age, MD, PhD [facility])

## 2020-08-25 DIAGNOSIS — G4733 Obstructive sleep apnea (adult) (pediatric): Secondary | ICD-10-CM | POA: Diagnosis not present

## 2020-08-25 NOTE — Progress Notes (Signed)
Faxed order to [facility]. Fax confirmation received.

## 2020-09-06 ENCOUNTER — Encounter: Payer: Self-pay | Admitting: Family Medicine

## 2020-09-06 ENCOUNTER — Other Ambulatory Visit: Payer: Self-pay

## 2020-09-06 ENCOUNTER — Ambulatory Visit (INDEPENDENT_AMBULATORY_CARE_PROVIDER_SITE_OTHER): Payer: Medicare HMO | Admitting: Family Medicine

## 2020-09-06 VITALS — BP 139/93 | HR 74 | Temp 97.5°F | Ht 62.0 in | Wt 161.2 lb

## 2020-09-06 DIAGNOSIS — N1831 Chronic kidney disease, stage 3a: Secondary | ICD-10-CM

## 2020-09-06 DIAGNOSIS — E785 Hyperlipidemia, unspecified: Secondary | ICD-10-CM

## 2020-09-06 DIAGNOSIS — F5101 Primary insomnia: Secondary | ICD-10-CM

## 2020-09-06 DIAGNOSIS — E119 Type 2 diabetes mellitus without complications: Secondary | ICD-10-CM | POA: Diagnosis not present

## 2020-09-06 DIAGNOSIS — R69 Illness, unspecified: Secondary | ICD-10-CM | POA: Diagnosis not present

## 2020-09-06 DIAGNOSIS — E1169 Type 2 diabetes mellitus with other specified complication: Secondary | ICD-10-CM

## 2020-09-06 DIAGNOSIS — G4733 Obstructive sleep apnea (adult) (pediatric): Secondary | ICD-10-CM | POA: Diagnosis not present

## 2020-09-06 LAB — BAYER DCA HB A1C WAIVED: HB A1C (BAYER DCA - WAIVED): 6.4 % (ref <7.0)

## 2020-09-06 MED ORDER — LANCET DEVICE MISC: 12 refills | Status: DC

## 2020-09-06 MED ORDER — ROSUVASTATIN CALCIUM 10 MG PO TABS
10.0000 mg | ORAL_TABLET | Freq: Every day | ORAL | 3 refills | Status: DC
Start: 2020-09-06 — End: 2021-09-18

## 2020-09-06 MED ORDER — GLUCOSE BLOOD VI STRP: ORAL_STRIP | 12 refills | Status: DC

## 2020-09-06 MED ORDER — DOXEPIN HCL 3 MG PO TABS: 3.0000 mg | ORAL_TABLET | Freq: Every evening | ORAL | 1 refills | Status: DC | PRN

## 2020-09-06 NOTE — Progress Notes (Signed)
Subjective: CC: new onset DM PCP: [name], DO HEN:IDPOE Nichole Cunningham is a 70 y.o. female presenting to clinic today for:  1. Type 2 Diabetes with CKD3a, hyperlipidemia:  New onset as of 2022.  Has seen nutrition.  Here for 3 month follow up.  She really has cut back on her sugar intake and essentially eliminated sweet tea, decrease fried foods quite a bit.  She needs some testing supplies.  She only tests occasionally.  Using One touch verio reflect.  Last eye exam: needs Last foot exam: needs Last A1c:  Lab Results  Component Value Date   HGBA1C 7.3 (H) 05/06/2020   Nephropathy screen indicated?: needs Last flu, zoster and/or pneumovax:  Immunization History  Administered Date(s) Administered   Moderna SARS-COV2 Booster Vaccination 05/20/2020   Moderna Sars-Covid-2 Vaccination 04/24/2019, 05/27/2019   Pneumococcal Conjugate-13 01/07/2017   Pneumococcal Polysaccharide-23 04/07/2018   Td 05/30/1999    ROS: No reports of CP, SOB, edema.  2. Insomnia Never started Doxepin.  Has some left over [place] that she uses PRN.   ROS: Per HPI  No Known Allergies Past Medical History:  Diagnosis Date   Complication of anesthesia    Hyperlipidemia    PONV (postoperative nausea and vomiting)     Current Outpatient Medications:    benzonatate (TESSALON PERLES) 100 MG capsule, Take 1 capsule (100 mg total) by mouth 3 (three) times daily as needed for cough., Disp: 30 capsule, Rfl: 2   Cholecalciferol 50 MCG (2000 UT) TABS, Take 1 tablet by mouth daily., Disp: , Rfl:    Doxepin HCl 3 MG TABS, Take 1 tablet (3 mg total) by mouth at bedtime., Disp: 30 tablet, Rfl: 0   fenofibrate 160 MG tablet, TAKE 1 TABLET (160 MG TOTAL) DAILY BY MOUTH., Disp: 90 tablet, Rfl: 3   fluticasone (FLONASE) 50 MCG/ACT nasal spray, PLACE 1 SPRAY INTO BOTH NOSTRILS 2 (TWO) TIMES DAILY AS NEEDED FOR ALLERGIES OR RHINITIS., Disp: 48 mL, Rfl: 5   lidocaine (LIDODERM) 5 %, Place 1 patch onto the skin  daily. Remove & Discard patch within 12 hours or as directed by MD (for back pain), Disp: 30 patch, Rfl: prn   Omega-3 Fatty Acids (OMEGA 3 PO), Take by mouth., Disp: , Rfl:    rosuvastatin (CRESTOR) 10 MG tablet, TAKE 1 TABLET BY MOUTH EVERY DAY, Disp: 90 tablet, Rfl: 0 Social History   Socioeconomic History   Marital status: Married    Spouse name: Charles   Number of children: 2   Years of education: 12   Highest education level: High school graduate  Occupational History   Occupation: retired  Tobacco Use   Smoking status: Never   Smokeless tobacco: Never  Scientific laboratory technician Use: Never used  Substance and Sexual Activity   Alcohol use: No   Drug use: No   Sexual activity: Yes    Birth control/protection: Post-menopausal  Other Topics Concern   Not on file  Social History Narrative   Not on file   Social Determinants of Health   Financial Resource Strain: Not on file  Food Insecurity: Not on file  Transportation Needs: Not on file  Physical Activity: Not on file  Stress: Not on file  Social Connections: Not on file  Intimate Partner Violence: Not on file   Family History  Problem Relation Age of Onset   Breast cancer Maternal Aunt    Cancer Mother    Heart disease Father    Heart  attack Father        cause of death   Cancer Sister    Colon cancer Sister        dx 2020   Cancer Brother        metastatic w/ uncertain primary dx 0211   Anesthesia problems Neg Hx    Hypotension Neg Hx    Malignant hyperthermia Neg Hx    Pseudochol deficiency Neg Hx    Stomach cancer Neg Hx     Objective: Office vital signs reviewed. BP (!) 139/93   Pulse 74   Temp (!) 97.5 F (36.4 C)   Ht $R'5\' 2"'NN$  (1.575 m)   Wt 161 lb 3.2 oz (73.1 kg)   SpO2 93%   BMI 29.48 kg/m   Physical Examination:  General: Awake, alert, well nourished, No acute distress HEENT: Normal, sclera white, MMM Cardio: regular rate and rhythm, S1S2 heard, no murmurs appreciated Pulm: clear to  auscultation bilaterally, no wheezes, rhonchi or rales; normal work of breathing on room air Extremities: warm, well perfused, No edema, cyanosis or clubbing; +2 pulses bilaterally MSK: normal gait and station Skin: dry; intact; no rashes or lesions Neuro: see DM foot Diabetic Foot Exam - Simple   Simple Foot Form Diabetic Foot exam was performed with the following findings: Yes 09/06/2020  8:54 AM  Visual Inspection No deformities, no ulcerations, no other skin breakdown bilaterally: Yes Sensation Testing Intact to touch and monofilament testing bilaterally: Yes Pulse Check Posterior Tibialis and Dorsalis pulse intact bilaterally: Yes Comments      Assessment/ Plan: 70 y.o. female   New onset type 2 diabetes mellitus ([place]) - Plan: Microalbumin / creatinine urine ratio, Bayer DCA Hb A1c Waived, glucose blood test strip, Lancet Device MISC  Chronic kidney disease, stage 3a ([facility]) - Plan: Microalbumin / creatinine urine ratio, CMP14+EGFR, CBC  Hyperlipidemia associated with type 2 diabetes mellitus ([facility]) - Plan: CMP14+EGFR, Lipid Panel, rosuvastatin (CRESTOR) 10 MG tablet  Primary insomnia  Sugar under excellent control.  It is diet controlled with A1c down to 6.4.  Urine microalbumin ordered.  Diabetic foot exam performed.  Testing supplies sent.  Check CMP, CBC given CKD 3A  Continue rosuvastatin, fenofibrate.  Lipid panel and CMP ordered  Has not used doxepin yet for insomnia.  She has to fail doxepin before Ambien will be covered.  No orders of the defined types were placed in this encounter.  No orders of the defined types were placed in this encounter.    [name], DO [place] [phone]

## 2020-09-06 NOTE — Patient Instructions (Signed)
Sugar under EXCELLENT control. GREAT JOB!  Get your eye exam done.  This is important with diabetes.

## 2020-09-07 LAB — CMP14+EGFR
ALT: 20 IU/L (ref 0–32)
AST: 21 IU/L (ref 0–40)
Albumin/Globulin Ratio: 2 (ref 1.2–2.2)
Albumin: 4.7 g/dL (ref 3.8–4.8)
Alkaline Phosphatase: 84 IU/L (ref 44–121)
BUN/Creatinine Ratio: 20 (ref 12–28)
BUN: 25 mg/dL (ref 8–27)
Bilirubin Total: 0.7 mg/dL (ref 0.0–1.2)
CO2: 23 mmol/L (ref 20–29)
Calcium: 10 mg/dL (ref 8.7–10.3)
Chloride: 100 mmol/L (ref 96–106)
Creatinine, Ser: 1.22 mg/dL — ABNORMAL HIGH (ref 0.57–1.00)
Globulin, Total: 2.3 g/dL (ref 1.5–4.5)
Glucose: 117 mg/dL — ABNORMAL HIGH (ref 65–99)
Potassium: 5 mmol/L (ref 3.5–5.2)
Sodium: 143 mmol/L (ref 134–144)
Total Protein: 7 g/dL (ref 6.0–8.5)
eGFR: 48 mL/min/{1.73_m2} — ABNORMAL LOW (ref >59)

## 2020-09-07 LAB — MICROALBUMIN / CREATININE URINE RATIO
Creatinine, Urine: 104.3 mg/dL
Microalb/Creat Ratio: <3 mg/g creat (ref 0–29)
Microalbumin, Urine: <3 ug/mL

## 2020-09-07 LAB — LIPID PANEL
Chol/HDL Ratio: 3.2 ratio (ref 0.0–4.4)
Cholesterol, Total: 141 mg/dL (ref 100–199)
HDL: 44 mg/dL (ref >39)
LDL Chol Calc (NIH): 76 mg/dL (ref 0–99)
Triglycerides: 116 mg/dL (ref 0–149)
VLDL Cholesterol Cal: 21 mg/dL (ref 5–40)

## 2020-09-07 LAB — CBC
Hematocrit: 45.2 % (ref 34.0–46.6)
Hemoglobin: 15.2 g/dL (ref 11.1–15.9)
MCH: 30.6 pg (ref 26.6–33.0)
MCHC: 33.6 g/dL (ref 31.5–35.7)
MCV: 91 fL (ref 79–97)
Platelets: 234 10*3/uL (ref 150–450)
RBC: 4.96 x10E6/uL (ref 3.77–5.28)
RDW: 12.2 % (ref 11.7–15.4)
WBC: 7.5 10*3/uL (ref 3.4–10.8)

## 2020-09-07 NOTE — Addendum Note (Signed)
Addended byCarrolyn Leigh on: 09/07/2020 05:04 PM   Modules accepted: Orders

## 2020-09-09 ENCOUNTER — Other Ambulatory Visit: Payer: Self-pay

## 2020-09-09 DIAGNOSIS — R944 Abnormal results of kidney function studies: Secondary | ICD-10-CM

## 2020-09-15 ENCOUNTER — Other Ambulatory Visit: Payer: Self-pay

## 2020-09-15 ENCOUNTER — Other Ambulatory Visit: Payer: Medicare HMO

## 2020-09-15 DIAGNOSIS — R944 Abnormal results of kidney function studies: Secondary | ICD-10-CM

## 2020-09-16 LAB — BMP8+EGFR
BUN/Creatinine Ratio: 28 (ref 12–28)
BUN: 29 mg/dL — ABNORMAL HIGH (ref 8–27)
CO2: 24 mmol/L (ref 20–29)
Calcium: 9.9 mg/dL (ref 8.7–10.3)
Chloride: 105 mmol/L (ref 96–106)
Creatinine, Ser: 1.05 mg/dL — ABNORMAL HIGH (ref 0.57–1.00)
Glucose: 116 mg/dL — ABNORMAL HIGH (ref 65–99)
Potassium: 5.2 mmol/L (ref 3.5–5.2)
Sodium: 142 mmol/L (ref 134–144)
eGFR: 58 mL/min/{1.73_m2} — ABNORMAL LOW (ref >59)

## 2020-09-25 DIAGNOSIS — G4733 Obstructive sleep apnea (adult) (pediatric): Secondary | ICD-10-CM | POA: Diagnosis not present

## 2020-10-07 ENCOUNTER — Ambulatory Visit (INDEPENDENT_AMBULATORY_CARE_PROVIDER_SITE_OTHER): Payer: Medicare HMO

## 2020-10-07 VITALS — Ht 62.0 in | Wt 161.0 lb

## 2020-10-07 DIAGNOSIS — Z0000 Encounter for general adult medical examination without abnormal findings: Secondary | ICD-10-CM | POA: Diagnosis not present

## 2020-10-07 DIAGNOSIS — E7800 Pure hypercholesterolemia, unspecified: Secondary | ICD-10-CM | POA: Insufficient documentation

## 2020-10-07 NOTE — Patient Instructions (Signed)
Nichole Cunningham , Thank you for taking time to come for your Medicare Wellness Visit. I appreciate your ongoing commitment to your health goals. Please review the following plan we discussed and let me know if I can assist you in the future.   Screening recommendations/referrals: Colonoscopy: Done 03/14/2012 - Repeat in 10 years Mammogram: Done 05/19/2020 - Repeat annually Bone Density: Done 10/14/2017 - Repeat every 2 years *next visit Recommended yearly ophthalmology/optometry visit for glaucoma screening and checkup Recommended yearly dental visit for hygiene and checkup  Vaccinations: Influenza vaccine: Declined Pneumococcal vaccine: Done 01/07/2017 & 04/07/2018 Tdap vaccine: Done 05/30/1999 - Repeat in 10 years *due Shingles vaccine: Due. Shingrix discussed. Please contact your pharmacy for coverage information.     Covid-19: Done 04/24/19, 05/27/19, & 05/20/2020  Advanced directives: Please bring a copy of your health care power of attorney and living will to the office to be added to your chart at your convenience.   Conditions/risks identified: Aim for 30 minutes of exercise or brisk walking each day, drink 6-8 glasses of water and eat lots of fruits and vegetables.   Next appointment: Follow up in one year for your annual wellness visit    Preventive Care 65 Years and Older, Female Preventive care refers to lifestyle choices and visits with your health care provider that can promote health and wellness. What does preventive care include? A yearly physical exam. This is also called an annual well check. Dental exams once or twice a year. Routine eye exams. Ask your health care provider how often you should have your eyes checked. Personal lifestyle choices, including: Daily care of your teeth and gums. Regular physical activity. Eating a healthy diet. Avoiding tobacco and drug use. Limiting alcohol use. Practicing safe sex. Taking low-dose aspirin every day. Taking vitamin and mineral  supplements as recommended by your health care provider. What happens during an annual well check? The services and screenings done by your health care provider during your annual well check will depend on your age, overall health, lifestyle risk factors, and family history of disease. Counseling  Your health care provider may ask you questions about your: Alcohol use. Tobacco use. Drug use. Emotional well-being. Home and relationship well-being. Sexual activity. Eating habits. History of falls. Memory and ability to understand (cognition). Work and work Statistician. Reproductive health. Screening  You may have the following tests or measurements: Height, weight, and BMI. Blood pressure. Lipid and cholesterol levels. These may be checked every 5 years, or more frequently if you are over 39 years old. Skin check. Lung cancer screening. You may have this screening every year starting at age 68 if you have a 30-pack-year history of smoking and currently smoke or have quit within the past 15 years. Fecal occult blood test (FOBT) of the stool. You may have this test every year starting at age 76. Flexible sigmoidoscopy or colonoscopy. You may have a sigmoidoscopy every 5 years or a colonoscopy every 10 years starting at age 35. Hepatitis C blood test. Hepatitis B blood test. Sexually transmitted disease (STD) testing. Diabetes screening. This is done by checking your blood sugar (glucose) after you have not eaten for a while (fasting). You may have this done every 1-3 years. Bone density scan. This is done to screen for osteoporosis. You may have this done starting at age 21. Mammogram. This may be done every 1-2 years. Talk to your health care provider about how often you should have regular mammograms. Talk with your health care provider about  your test results, treatment options, and if necessary, the need for more tests. Vaccines  Your health care provider may recommend certain  vaccines, such as: Influenza vaccine. This is recommended every year. Tetanus, diphtheria, and acellular pertussis (Tdap, Td) vaccine. You may need a Td booster every 10 years. Zoster vaccine. You may need this after age 28. Pneumococcal 13-valent conjugate (PCV13) vaccine. One dose is recommended after age 76. Pneumococcal polysaccharide (PPSV23) vaccine. One dose is recommended after age 36. Talk to your health care provider about which screenings and vaccines you need and how often you need them. This information is not intended to replace advice given to you by your health care provider. Make sure you discuss any questions you have with your health care provider. Document Released: 03/11/2015 Document Revised: 11/02/2015 Document Reviewed: 12/14/2014 Elsevier Interactive Patient Education  2017 [employer] Prevention in the Home Falls can cause injuries. They can happen to people of all ages. There are many things you can do to make your home safe and to help prevent falls. What can I do on the outside of my home? Regularly fix the edges of walkways and driveways and fix any cracks. Remove anything that might make you trip as you walk through a door, such as a raised step or threshold. Trim any bushes or trees on the path to your home. Use bright outdoor lighting. Clear any walking paths of anything that might make someone trip, such as rocks or tools. Regularly check to see if handrails are loose or broken. Make sure that both sides of any steps have handrails. Any raised decks and porches should have guardrails on the edges. Have any leaves, snow, or ice cleared regularly. Use sand or salt on walking paths during winter. Clean up any spills in your garage right away. This includes oil or grease spills. What can I do in the bathroom? Use night lights. Install grab bars by the toilet and in the tub and shower. Do not use towel bars as grab bars. Use non-skid mats or decals in  the tub or shower. If you need to sit down in the shower, use a plastic, non-slip stool. Keep the floor dry. Clean up any water that spills on the floor as soon as it happens. Remove soap buildup in the tub or shower regularly. Attach bath mats securely with double-sided non-slip rug tape. Do not have throw rugs and other things on the floor that can make you trip. What can I do in the bedroom? Use night lights. Make sure that you have a light by your bed that is easy to reach. Do not use any sheets or blankets that are too big for your bed. They should not hang down onto the floor. Have a firm chair that has side arms. You can use this for support while you get dressed. Do not have throw rugs and other things on the floor that can make you trip. What can I do in the kitchen? Clean up any spills right away. Avoid walking on wet floors. Keep items that you use a lot in easy-to-reach places. If you need to reach something above you, use a strong step stool that has a grab bar. Keep electrical cords out of the way. Do not use floor polish or wax that makes floors slippery. If you must use wax, use non-skid floor wax. Do not have throw rugs and other things on the floor that can make you trip. What can I do with  my stairs? Do not leave any items on the stairs. Make sure that there are handrails on both sides of the stairs and use them. Fix handrails that are broken or loose. Make sure that handrails are as long as the stairways. Check any carpeting to make sure that it is firmly attached to the stairs. Fix any carpet that is loose or worn. Avoid having throw rugs at the top or bottom of the stairs. If you do have throw rugs, attach them to the floor with carpet tape. Make sure that you have a light switch at the top of the stairs and the bottom of the stairs. If you do not have them, ask someone to add them for you. What else can I do to help prevent falls? Wear shoes that: Do not have high  heels. Have rubber bottoms. Are comfortable and fit you well. Are closed at the toe. Do not wear sandals. If you use a stepladder: Make sure that it is fully opened. Do not climb a closed stepladder. Make sure that both sides of the stepladder are locked into place. Ask someone to hold it for you, if possible. Clearly mark and make sure that you can see: Any grab bars or handrails. First and last steps. Where the edge of each step is. Use tools that help you move around (mobility aids) if they are needed. These include: Canes. Walkers. Scooters. Crutches. Turn on the lights when you go into a dark area. Replace any light bulbs as soon as they burn out. Set up your furniture so you have a clear path. Avoid moving your furniture around. If any of your floors are uneven, fix them. If there are any pets around you, be aware of where they are. Review your medicines with your doctor. Some medicines can make you feel dizzy. This can increase your chance of falling. Ask your doctor what other things that you can do to help prevent falls. This information is not intended to replace advice given to you by your health care provider. Make sure you discuss any questions you have with your health care provider. Document Released: 12/09/2008 Document Revised: 07/21/2015 Document Reviewed: 03/19/2014 Elsevier Interactive Patient Education  2017 [employer].

## 2020-10-07 NOTE — Progress Notes (Signed)
Subjective:   Nichole Cunningham is a 70 y.o. female who presents for Medicare Annual (Subsequent) preventive examination.  Virtual Visit via Telephone Note  I connected with  Nichole Cunningham on 10/07/20 at  8:15 AM EDT by telephone and verified that I am speaking with the correct person using two identifiers.  Location: Patient: Home Provider: [facility] Persons participating in the virtual visit: patient/Nurse Health Advisor   I discussed the limitations, risks, security and privacy concerns of performing an evaluation and management service by telephone and the availability of in person appointments. The patient expressed understanding and agreed to proceed.  Interactive audio and video telecommunications were attempted between this nurse and patient, however failed, due to patient having technical difficulties OR patient did not have access to video capability.  We continued and completed visit with audio only.  Some vital signs may be absent or patient reported.   Zayvian Mcmurtry E Jedidiah Demartini, LPN   Review of Systems     Cardiac Risk Factors include: advanced age (>33mn, >>66women);sedentary lifestyle;diabetes mellitus;dyslipidemia     Objective:    Today's Vitals   10/07/20 0821  Weight: 161 lb (73 kg)  Height: '5\' 2"'$  (1.575 m)   Body mass index is 29.45 kg/m.  Advanced Directives 10/07/2020 10/07/2019 09/30/2018 04/11/2011  Does Patient Have a Medical Advance Directive? Yes Yes No Patient does not have advance directive;Patient would not like information  Type of AScientist, forensicPower of AOld Brownsboro PlaceLiving will [place]- -  Does patient want to make changes to medical advance directive? - No - Patient declined - -  Copy of HN[place]in Chart? No - copy requested No - copy requested - -  Would patient like information on creating a medical advance directive? - No - Patient declined No - Patient declined -  Pre-existing out of facility DNR order  (yellow form or pink MOST form) - - - No    Current Medications (verified) Outpatient Encounter Medications as of 10/07/2020  Medication Sig   benzonatate (TESSALON PERLES) 100 MG capsule Take 1 capsule (100 mg total) by mouth 3 (three) times daily as needed for cough.   Cholecalciferol 50 MCG (2000 UT) TABS Take 1 tablet by mouth daily.   Doxepin HCl 3 MG TABS Take 1 tablet (3 mg total) by mouth at bedtime as needed (sleep).   fenofibrate 160 MG tablet TAKE 1 TABLET (160 MG TOTAL) DAILY BY MOUTH.   fluticasone (FLONASE) 50 MCG/ACT nasal spray PLACE 1 SPRAY INTO BOTH NOSTRILS 2 (TWO) TIMES DAILY AS NEEDED FOR ALLERGIES OR RHINITIS.   glucose blood test strip Test BGs daily as needed E11.9 (one touch verio reflect)   Lancet Device MISC Test BGs once daily as needed E11.9 (one touch verio reflect)   Lancets (ONETOUCH DELICA PLUS [id number] MISC Apply topically daily as needed.   lidocaine (LIDODERM) 5 % Place 1 patch onto the skin daily. Remove & Discard patch within 12 hours or as directed by MD (for back pain)   Omega-3 Fatty Acids (OMEGA 3 PO) Take by mouth.   rosuvastatin (CRESTOR) 10 MG tablet Take 1 tablet (10 mg total) by mouth daily.   No facility-administered encounter medications on file as of 10/07/2020.    Allergies (verified) Patient has no known allergies.   History: Past Medical History:  Diagnosis Date   Complication of anesthesia    Hyperlipidemia    PONV (postoperative nausea and vomiting)    Past Surgical History:  Procedure Laterality Date   CHOLECYSTECTOMY  04/13/2011   Procedure: LAPAROSCOPIC CHOLECYSTECTOMY;  Surgeon: Donato Heinz, MD;  Location: [facility];  Service: General;  Laterality: N/A;   [place]  2003   [place]   SKIN LESION EXCISION  02/2018   mole removed from forehead    Family History  Problem Relation Age of Onset   Breast cancer Maternal Aunt    Cancer Mother    Heart disease Father    Heart attack Father        cause of  death   Cancer Sister    Colon cancer Sister        dx 2020   Cancer Brother        metastatic w/ uncertain primary dx [id number]   Anesthesia problems Neg Hx    Hypotension Neg Hx    Malignant hyperthermia Neg Hx    Pseudochol deficiency Neg Hx    Stomach cancer Neg Hx    Social History   Socioeconomic History   Marital status: Married    Spouse name: Charles   Number of children: 2   Years of education: 12   Highest education level: High school graduate  Occupational History   Occupation: retired  Tobacco Use   Smoking status: Never   Smokeless tobacco: Never  Scientific laboratory technician Use: Never used  Substance and Sexual Activity   Alcohol use: No   Drug use: No   Sexual activity: Yes    Birth control/protection: Post-menopausal  Other Topics Concern   Not on file  Social History Narrative   Not on file   Social Determinants of Health   Financial Resource Strain: Low Risk    Difficulty of Paying Living Expenses: Not hard at all  Food Insecurity: No Food Insecurity   Worried About Charity fundraiser in the Last Year: Never true   [place] in the Last Year: Never true  Transportation Needs: No Transportation Needs   Lack of Transportation (Medical): No   Lack of Transportation (Non-Medical): No  Physical Activity: Inactive   Days of Exercise per Week: 0 days   Minutes of Exercise per Session: 0 min  Stress: No Stress Concern Present   Feeling of Stress : Only a little  Social Connections: Engineer, building services of Communication with Friends and Family: More than three times a week   Frequency of Social Gatherings with Friends and Family: More than three times a week   Attends Religious Services: More than 4 times per year   Active Member of [employer] or Organizations: Yes   Attends Music therapist: More than 4 times per year   Marital Status: Married    Tobacco Counseling Counseling given: Not Answered   Clinical Intake:  Pre-visit  preparation completed: Yes  Pain : No/denies pain     BMI - recorded: 29.45 Nutritional Status: BMI 25 -29 Overweight Nutritional Risks: None Diabetes: Yes CBG done?: No Did pt. bring in CBG monitor from home?: No  How often do you need to have someone help you when you read instructions, pamphlets, or other written materials from your doctor or pharmacy?: 1 - Never  Nutrition Risk Assessment:  Has the patient had any N/V/D within the last 2 months?  No  Does the patient have any non-healing wounds?  No  Has the patient had any unintentional weight loss or weight gain?  No   Diabetes:  Is the patient diabetic?  Yes  If diabetic, was a CBG obtained today?  No  Did the patient bring in their glucometer from home?  No  How often do you monitor your CBG's? Once or twice per week.   Financial Strains and Diabetes Management:  Are you having any financial strains with the device, your supplies or your medication? No .  Does the patient want to be seen by Chronic Care Management for management of their diabetes?  No  Would the patient like to be referred to a Nutritionist or for Diabetic Management?  No   Diabetic Exams:  Diabetic Eye Exam: Completed 10/15/2018. Overdue for diabetic eye exam. Pt has been advised about the importance in completing this exam. She has an appt with [facility] this month for retinal eye exam  Diabetic Foot Exam: Completed 09/06/2020. Pt has been advised about the importance in completing this exam. Pt is scheduled for diabetic foot exam on next year.    Interpreter Needed?: No  Information entered by :: Armel Rabbani, LPN   Activities of Daily Living In your present state of health, do you have any difficulty performing the following activities: 10/07/2020  Hearing? Y  Comment mild - declines hearing aids  Vision? N  Difficulty concentrating or making decisions? Y  Walking or climbing stairs? N  Dressing or bathing? N  Doing errands, shopping? N   Preparing Food and eating ? N  Using the Toilet? N  In the past six months, have you accidently leaked urine? N  Do you have problems with loss of bowel control? N  Managing your Medications? N  Managing your Finances? N  Housekeeping or managing your Housekeeping? N  Some recent data might be hidden    Patient Care Team: [name], DO as PCP - General (Family Medicine) Vania Rea, MD as Attending Physician (Obstetrics and Gynecology) Debbora Presto, NP as Nurse Practitioner (Family Medicine) Celestia Khat, [place] (Optometry) Allyn Kenner, MD (Dermatology)  Indicate any recent Medical Services you may have received from other than [facility] providers in the past year (date may be approximate).     Assessment:   This is a routine wellness examination for [place].  Hearing/Vision screen Hearing Screening - Comments:: C/o mild hearing difficulties - declines hearing aids Vision Screening - Comments:: Wears eyeglasses - behind on annual eye exams at [facility] - has appt for retinal exam at [facility] with [facility]  Dietary issues and exercise activities discussed: Current Exercise Habits: The patient does not participate in regular exercise at present, Exercise limited by: None identified   Goals Addressed             This Visit's Progress    Patient Stated   Not on track    10/07/2019 AWV Goal: Exercise for General Health  Patient will verbalize understanding of the benefits of increased physical activity: Exercising regularly is important. It will improve your overall fitness, flexibility, and endurance. Regular exercise also will improve your overall health. It can help you control your weight, reduce stress, and improve your bone density. Over the next year, patient will increase physical activity as tolerated with a goal of at least 150 minutes of moderate physical activity per week.  You can tell that you are exercising at a moderate intensity if your heart starts beating  faster and you start breathing faster but can still hold a conversation. Moderate-intensity exercise ideas include: Walking 1 mile (1.6 km) in about 15 minutes Biking Hiking Golfing Dancing Water aerobics Patient  will verbalize understanding of everyday activities that increase physical activity by providing examples like the following: Yard work, such as: Sales promotion account executive Gardening Washing windows or floors Patient will be able to explain general safety guidelines for exercising:  Before you start a new exercise program, talk with your health care provider. Do not exercise so much that you hurt yourself, feel dizzy, or get very short of breath. Wear comfortable clothes and wear shoes with good support. Drink plenty of water while you exercise to prevent dehydration or heat stroke. Work out until your breathing and your heartbeat get faster.        Depression Screen PHQ 2/9 Scores 10/07/2020 06/08/2020 05/06/2020 02/16/2020 10/21/2019 10/07/2019 04/22/2019  PHQ - 2 Score 0 0 0 0 0 0 0  PHQ- 9 Score - - 0 - - - 0    Fall Risk Fall Risk  10/07/2020 06/08/2020 05/06/2020 02/16/2020 10/21/2019  Falls in the past year? 0 0 0 0 0  Number falls in past yr: 0 0 - - -  Injury with Fall? 0 0 - - -  Risk for fall due to : Impaired vision - - - -  Follow up - - - - -    FALL RISK PREVENTION PERTAINING TO THE HOME:  Any stairs in or around the home? Yes  If so, are there any without handrails? No  Home free of loose throw rugs in walkways, pet beds, electrical cords, etc? Yes  Adequate lighting in your home to reduce risk of falls? Yes   ASSISTIVE DEVICES UTILIZED TO PREVENT FALLS:  Life alert? No  Use of a cane, walker or w/c? No  Grab bars in the bathroom? No  Shower chair or bench in shower? No  Elevated toilet seat or a handicapped toilet? No   TIMED UP AND GO:  Was the test performed? No .  Telephonic visit  Cognitive Function:     6CIT Screen 10/07/2020 10/07/2019 09/30/2018  What Year? 0 points 0 points 0 points  What month? 0 points 0 points 0 points  What time? 0 points 0 points 0 points  Count back from 20 0 points 0 points 0 points  Months in reverse 0 points 4 points 4 points  Repeat phrase 4 points 4 points 2 points  Total Score '4 8 6    '$ Immunizations Immunization History  Administered Date(s) Administered   Moderna SARS-COV2 Booster Vaccination 05/20/2020   Moderna Sars-Covid-2 Vaccination 04/24/2019, 05/27/2019   Pneumococcal Conjugate-13 01/07/2017   Pneumococcal Polysaccharide-23 04/07/2018   Td 05/30/1999    TDAP status: Due, Education has been provided regarding the importance of this vaccine. Advised may receive this vaccine at local pharmacy or Health Dept. Aware to provide a copy of the vaccination record if obtained from local pharmacy or Health Dept. Verbalized acceptance and understanding.  Flu Vaccine status: Declined, Education has been provided regarding the importance of this vaccine but patient still declined. Advised may receive this vaccine at local pharmacy or Health Dept. Aware to provide a copy of the vaccination record if obtained from local pharmacy or Health Dept. Verbalized acceptance and understanding.  Pneumococcal vaccine status: Up to date  Covid-19 vaccine status: Completed vaccines  Qualifies for Shingles Vaccine? Yes   Zostavax completed No   Shingrix Completed?: No.    Education has been provided regarding the importance of this vaccine. Patient has been advised to call insurance company  to determine out of pocket expense if they have not yet received this vaccine. Advised may also receive vaccine at local pharmacy or Health Dept. Verbalized acceptance and understanding.  Screening Tests Health Maintenance  Topic Date Due   OPHTHALMOLOGY EXAM  Never done   DEXA SCAN  10/15/2019   COVID-19 Vaccine (4 - Booster for Moderna  series) 08/20/2020   INFLUENZA VACCINE  09/26/2020   Zoster Vaccines- Shingrix (1 of 2) 12/07/2020 (Originally 01/09/1970)   TETANUS/TDAP  09/06/2021 (Originally 05/29/2009)   HEMOGLOBIN A1C  03/09/2021   MAMMOGRAM  05/09/2021   FOOT EXAM  09/06/2021   URINE MICROALBUMIN  09/06/2021   COLONOSCOPY (Pts 45-31yr Insurance coverage will need to be confirmed)  03/14/2022   Hepatitis C Screening  Completed   PNA vac Low Risk Adult  Completed   HPV VACCINES  Aged Out    Health Maintenance  Health Maintenance Due  Topic Date Due   OPHTHALMOLOGY EXAM  Never done   DEXA SCAN  10/15/2019   COVID-19 Vaccine (4 - Booster for Moderna series) 08/20/2020   INFLUENZA VACCINE  09/26/2020    Colorectal cancer screening: Type of screening: Colonoscopy. Completed 03/14/2012. Repeat every 10 years  Mammogram status: Completed 05/19/2020. Repeat every year  Bone Density status: Completed 10/14/2017. Results reflect: Bone density results: OSTEOPENIA. Repeat every 2 years.  Lung Cancer Screening: (Low Dose CT Chest recommended if Age 70-80years, 30 pack-year currently smoking OR have quit w/in 15years.) does not qualify.   Additional Screening:  Hepatitis C Screening: does qualify; Completed 05/25/2015  Vision Screening: Recommended annual ophthalmology exams for early detection of glaucoma and other disorders of the eye. Is the patient up to date with their annual eye exam?  No  Who is the provider or what is the name of the office in which the patient attends annual eye exams? [email or web address] pt is not established with a provider, would they like to be referred to a provider to establish care?  No [facility] .   Dental Screening: Recommended annual dental exams for proper oral hygiene  Community Resource Referral / Chronic Care Management: CRR required this visit?  No   CCM required this visit?  No      Plan:     I have personally reviewed and  noted the following in the patient's chart:   Medical and social history Use of alcohol, tobacco or illicit drugs  Current medications and supplements including opioid prescriptions.  Functional ability and status Nutritional status Physical activity Advanced directives List of other physicians Hospitalizations, surgeries, and ER visits in previous 12 months Vitals Screenings to include cognitive, depression, and falls Referrals and appointments  In addition, I have reviewed and discussed with patient certain preventive protocols, quality metrics, and best practice recommendations. A written personalized care plan for preventive services as well as general preventive health recommendations were provided to patient.     ASandrea Hammond LPN   [id number]  Nurse Notes: None

## 2020-10-26 DIAGNOSIS — G4733 Obstructive sleep apnea (adult) (pediatric): Secondary | ICD-10-CM | POA: Diagnosis not present

## 2020-11-03 DIAGNOSIS — G4733 Obstructive sleep apnea (adult) (pediatric): Secondary | ICD-10-CM | POA: Diagnosis not present

## 2020-11-07 ENCOUNTER — Ambulatory Visit: Payer: Medicare HMO | Admitting: Family Medicine

## 2020-11-22 DIAGNOSIS — Z85828 Personal history of other malignant neoplasm of skin: Secondary | ICD-10-CM | POA: Diagnosis not present

## 2020-11-22 DIAGNOSIS — B078 Other viral warts: Secondary | ICD-10-CM | POA: Diagnosis not present

## 2020-11-22 DIAGNOSIS — Z08 Encounter for follow-up examination after completed treatment for malignant neoplasm: Secondary | ICD-10-CM | POA: Diagnosis not present

## 2020-11-25 DIAGNOSIS — G4733 Obstructive sleep apnea (adult) (pediatric): Secondary | ICD-10-CM | POA: Diagnosis not present

## 2020-12-09 DIAGNOSIS — G4733 Obstructive sleep apnea (adult) (pediatric): Secondary | ICD-10-CM | POA: Diagnosis not present

## 2020-12-21 ENCOUNTER — Encounter: Payer: Self-pay | Admitting: Family Medicine

## 2020-12-21 ENCOUNTER — Ambulatory Visit (INDEPENDENT_AMBULATORY_CARE_PROVIDER_SITE_OTHER): Payer: Medicare HMO | Admitting: Family Medicine

## 2020-12-21 ENCOUNTER — Other Ambulatory Visit: Payer: Self-pay

## 2020-12-21 DIAGNOSIS — J988 Other specified respiratory disorders: Secondary | ICD-10-CM

## 2020-12-21 MED ORDER — AMOXICILLIN-POT CLAVULANATE 875-125 MG PO TABS: 1.0000 | ORAL_TABLET | Freq: Two times a day (BID) | ORAL | 0 refills | Status: AC

## 2020-12-21 MED ORDER — PREDNISONE 20 MG PO TABS: 20.0000 mg | ORAL_TABLET | Freq: Every day | ORAL | 0 refills | Status: AC

## 2020-12-21 NOTE — Progress Notes (Signed)
   Virtual Visit  Note Due to COVID-19 pandemic this visit was conducted virtually. This visit type was conducted due to national recommendations for restrictions regarding the COVID-19 Pandemic (e.g. social distancing, sheltering in place) in an effort to limit this patient's exposure and mitigate transmission in our community. All issues noted in this document were discussed and addressed.  A physical exam was not performed with this format.  I connected with [name] on 12/21/20 at 1433 by telephone and verified that I am speaking with the correct person using two identifiers. CHENELLE BENNING is currently located at home and no one is currently with her during the visit. The provider, Gwenlyn Perking, FNP is located in their office at time of visit.  I discussed the limitations, risks, security and privacy concerns of performing an evaluation and management service by telephone and the availability of in person appointments. I also discussed with the patient that there may be a patient responsible charge related to this service. The patient expressed understanding and agreed to proceed.  CC: cough  History and Present Illness:  HPI Ellarose reports a dry cough for 2 weeks. She reports chest tightness with congestion for the last few days and fatigue. She denies fever, chills, shortness of breath, or chest pain. She has been taking mucinex without improvement.     ROS As per HPI.   Observations/Objective: Alert and oriented x 3. Able to speak in full sentences without difficulty.    Assessment and Plan: Oluwateniola was seen today for cough.  Diagnoses and all orders for this visit:  Respiratory infection Augmentin and prednisone as below. Strict return precautions given. Discussed symptomatic care.  -     amoxicillin-clavulanate (AUGMENTIN) 875-125 MG tablet; Take 1 tablet by mouth 2 (two) times daily for 7 days. -     predniSONE (DELTASONE) 20 MG tablet; Take 1 tablet (20 mg total) by  mouth daily with breakfast for 5 days.    Follow Up Instructions: As needed.     I discussed the assessment and treatment plan with the patient. The patient was provided an opportunity to ask questions and all were answered. The patient agreed with the plan and demonstrated an understanding of the instructions.   The patient was advised to call back or seek an in-person evaluation if the symptoms worsen or if the condition fails to improve as anticipated.  The above assessment and management plan was discussed with the patient. The patient verbalized understanding of and has agreed to the management plan. Patient is aware to call the clinic if symptoms persist or worsen. Patient is aware when to return to the clinic for a follow-up visit. Patient educated on when it is appropriate to go to the emergency department.   Time call ended:  1444  I provided 11 minutes of  non face-to-face time during this encounter.    Gwenlyn Perking, FNP

## 2020-12-26 DIAGNOSIS — G4733 Obstructive sleep apnea (adult) (pediatric): Secondary | ICD-10-CM | POA: Diagnosis not present

## 2021-01-10 DIAGNOSIS — J101 Influenza due to other identified influenza virus with other respiratory manifestations: Secondary | ICD-10-CM | POA: Diagnosis not present

## 2021-01-10 DIAGNOSIS — R059 Cough, unspecified: Secondary | ICD-10-CM | POA: Diagnosis not present

## 2021-01-10 DIAGNOSIS — J020 Streptococcal pharyngitis: Secondary | ICD-10-CM | POA: Diagnosis not present

## 2021-01-25 DIAGNOSIS — G4733 Obstructive sleep apnea (adult) (pediatric): Secondary | ICD-10-CM | POA: Diagnosis not present

## 2021-02-09 DIAGNOSIS — G4733 Obstructive sleep apnea (adult) (pediatric): Secondary | ICD-10-CM | POA: Diagnosis not present

## 2021-02-10 ENCOUNTER — Encounter: Payer: Self-pay | Admitting: Family Medicine

## 2021-02-10 ENCOUNTER — Ambulatory Visit (INDEPENDENT_AMBULATORY_CARE_PROVIDER_SITE_OTHER): Payer: Medicare HMO | Admitting: Family Medicine

## 2021-02-10 ENCOUNTER — Ambulatory Visit (INDEPENDENT_AMBULATORY_CARE_PROVIDER_SITE_OTHER): Payer: Medicare HMO

## 2021-02-10 VITALS — BP 124/85 | HR 79 | Temp 97.6°F | Ht 62.0 in | Wt 166.8 lb

## 2021-02-10 DIAGNOSIS — E119 Type 2 diabetes mellitus without complications: Secondary | ICD-10-CM

## 2021-02-10 DIAGNOSIS — M542 Cervicalgia: Secondary | ICD-10-CM | POA: Diagnosis not present

## 2021-02-10 DIAGNOSIS — M85851 Other specified disorders of bone density and structure, right thigh: Secondary | ICD-10-CM | POA: Diagnosis not present

## 2021-02-10 DIAGNOSIS — M25511 Pain in right shoulder: Secondary | ICD-10-CM

## 2021-02-10 DIAGNOSIS — E1169 Type 2 diabetes mellitus with other specified complication: Secondary | ICD-10-CM

## 2021-02-10 DIAGNOSIS — Z23 Encounter for immunization: Secondary | ICD-10-CM

## 2021-02-10 DIAGNOSIS — Z780 Asymptomatic menopausal state: Secondary | ICD-10-CM

## 2021-02-10 DIAGNOSIS — M50322 Other cervical disc degeneration at C5-C6 level: Secondary | ICD-10-CM | POA: Diagnosis not present

## 2021-02-10 DIAGNOSIS — R29890 Loss of height: Secondary | ICD-10-CM | POA: Diagnosis not present

## 2021-02-10 DIAGNOSIS — N1831 Chronic kidney disease, stage 3a: Secondary | ICD-10-CM

## 2021-02-10 DIAGNOSIS — E785 Hyperlipidemia, unspecified: Secondary | ICD-10-CM | POA: Diagnosis not present

## 2021-02-10 LAB — BAYER DCA HB A1C WAIVED: HB A1C (BAYER DCA - WAIVED): 6.4 % — ABNORMAL HIGH (ref 4.8–5.6)

## 2021-02-10 MED ORDER — TIZANIDINE HCL 4 MG PO TABS
2.0000 mg | ORAL_TABLET | Freq: Three times a day (TID) | ORAL | 1 refills | Status: DC | PRN
Start: 2021-02-10 — End: 2021-11-06

## 2021-02-10 MED ORDER — FENOFIBRATE 160 MG PO TABS: ORAL_TABLET | ORAL | 3 refills | Status: DC

## 2021-02-10 NOTE — Progress Notes (Signed)
Subjective: CC:DM PCP: [name], DO MWN:UUVOZ Nichole Cunningham is a 70 y.o. female presenting to clinic today for:  1. Type 2 Diabetes with CKD3a , hyperlipidemia:   Diabetes is diet controlled.  She continues to watch her diet closely. Compliant with her cholesterol medications.  No chest pain, shortness of breath, visual disturbance.  Last eye exam: Had to reschedule because she had flu.  We will plan to get this done after the new year Last foot exam: Up-to-date Last A1c:  Lab Results  Component Value Date   HGBA1C 6.4 09/06/2020   Nephropathy screen indicated?:  Up-to-date Last flu, zoster and/or pneumovax:  Immunization History  Administered Date(s) Administered   Moderna SARS-COV2 Booster Vaccination 05/20/2020   Moderna Sars-Covid-2 Vaccination 04/24/2019, 05/27/2019   Pneumococcal Conjugate-13 01/07/2017   Pneumococcal Polysaccharide-23 04/07/2018   Td 05/30/1999    2.  Neck pain Patient reports has had about a 1 month history of neck and shoulder pain.  The pain radiates into the shoulder blade and can be a 10 out of 10 at sometimes.  This is been ongoing for a month but she denies any preceding injury.  She is right-hand dominant.  Reports occasional numbness and tingling in the upper extremity.  No weakness reported.  Has been taking Tylenol but does not alleviate much.    ROS: Per HPI  No Known Allergies Past Medical History:  Diagnosis Date   Complication of anesthesia    Hyperlipidemia    PONV (postoperative nausea and vomiting)     Current Outpatient Medications:    benzonatate (TESSALON PERLES) 100 MG capsule, Take 1 capsule (100 mg total) by mouth 3 (three) times daily as needed for cough., Disp: 30 capsule, Rfl: 2   Cholecalciferol 50 MCG (2000 UT) TABS, Take 1 tablet by mouth daily., Disp: , Rfl:    Doxepin HCl 3 MG TABS, Take 1 tablet (3 mg total) by mouth at bedtime as needed (sleep)., Disp: 30 tablet, Rfl: 1   fenofibrate 160 MG tablet, TAKE 1  TABLET (160 MG TOTAL) DAILY BY MOUTH., Disp: 90 tablet, Rfl: 3   fluticasone (FLONASE) 50 MCG/ACT nasal spray, PLACE 1 SPRAY INTO BOTH NOSTRILS 2 (TWO) TIMES DAILY AS NEEDED FOR ALLERGIES OR RHINITIS., Disp: 48 mL, Rfl: 5   glucose blood test strip, Test BGs daily as needed E11.9 (one touch verio reflect), Disp: 100 each, Rfl: 12   Lancet Device MISC, Test BGs once daily as needed E11.9 (one touch verio reflect), Disp: 100 each, Rfl: 12   Lancets (ONETOUCH DELICA PLUS [id number]) MISC, Apply topically daily as needed., Disp: , Rfl:    lidocaine (LIDODERM) 5 %, Place 1 patch onto the skin daily. Remove & Discard patch within 12 hours or as directed by MD (for back pain), Disp: 30 patch, Rfl: prn   Omega-3 Fatty Acids (OMEGA 3 PO), Take by mouth., Disp: , Rfl:    rosuvastatin (CRESTOR) 10 MG tablet, Take 1 tablet (10 mg total) by mouth daily., Disp: 90 tablet, Rfl: 3 Social History   Socioeconomic History   Marital status: Married    Spouse name: Charles   Number of children: 2   Years of education: 12   Highest education level: High school graduate  Occupational History   Occupation: retired  Tobacco Use   Smoking status: Never   Smokeless tobacco: Never  Vaping Use   Vaping Use: Never used  Substance and Sexual Activity   Alcohol use: No   Drug use: No  Sexual activity: Yes    Birth control/protection: Post-menopausal  Other Topics Concern   Not on file  Social History Narrative   Not on file   Social Determinants of Health   Financial Resource Strain: Low Risk    Difficulty of Paying Living Expenses: Not hard at all  Food Insecurity: No Food Insecurity   Worried About Charity fundraiser in the Last Year: Never true   [place] in the Last Year: Never true  Transportation Needs: No Transportation Needs   Lack of Transportation (Medical): No   Lack of Transportation (Non-Medical): No  Physical Activity: Inactive   Days of Exercise per Week: 0 days   Minutes of  Exercise per Session: 0 min  Stress: No Stress Concern Present   Feeling of Stress : Only a little  Social Connections: Engineer, building services of Communication with Friends and Family: More than three times a week   Frequency of Social Gatherings with Friends and Family: More than three times a week   Attends Religious Services: More than 4 times per year   Active Member of [employer] or Organizations: Yes   Attends Music therapist: More than 4 times per year   Marital Status: Married  Human resources officer Violence: Not At Risk   Fear of Current or Ex-Partner: No   Emotionally Abused: No   Physically Abused: No   Sexually Abused: No   Family History  Problem Relation Age of Onset   Breast cancer Maternal Aunt    Cancer Mother    Heart disease Father    Heart attack Father        cause of death   Cancer Sister    Colon cancer Sister        dx 2020   Cancer Brother        metastatic w/ uncertain primary dx 5427   Anesthesia problems Neg Hx    Hypotension Neg Hx    Malignant hyperthermia Neg Hx    Pseudochol deficiency Neg Hx    Stomach cancer Neg Hx     Objective: Office vital signs reviewed. BP 124/85    Pulse 79    Temp 97.6 F (36.4 C)    Ht 5\' 2"  (1.575 m)    Wt 166 lb 12.8 oz (75.7 kg)    SpO2 93%    BMI 30.51 kg/m   Physical Examination:  General: Awake, alert, well nourished, No acute distress Cardio: regular rate and rhythm, S1S2 heard, no murmurs appreciated Pulm: clear to auscultation bilaterally, no wheezes, rhonchi or rales; normal work of breathing on room air MSK:   C-spine: Reduced active range of motion in extension, flexion and rotation to the left.  No midline tenderness palpation to the spine.  She has tenderness palpation along the right paraspinal muscles and trapezius.  Right shoulder: Mild tenderness palpation to the right shoulder.  No palpable deformities, soft tissue swelling, erythema or warmth. Neuro: 5/5 UE strength bilaterally.   She has decreased light touch and station to the left upper extremity compared to the right  Assessment/ Plan: 70 y.o. female   Diet-controlled diabetes mellitus ([place]) - Plan: Bayer DCA Hb A1c Waived  Chronic kidney disease, stage 3a ([place]) - Plan: Renal Function Panel, Renal Function Panel, CANCELED: Renal Function Panel  Hyperlipidemia associated with type 2 diabetes mellitus ([place]) - Plan: fenofibrate 160 MG tablet  Asymptomatic postmenopausal estrogen deficiency - Plan: DG [facility] DEXA  Neck pain - Plan: DG  Cervical Spine 2 or 3 views, DG Shoulder Right, Ambulatory referral to Physical Therapy, tiZANidine (ZANAFLEX) 4 MG tablet  Acute pain of right shoulder - Plan: DG Cervical Spine 2 or 3 views, DG Shoulder Right, Ambulatory referral to Physical Therapy, tiZANidine (ZANAFLEX) 4 MG tablet  She remains under excellent control.  Check renal function.  She will get her diabetic eye exam done  Not yet due for fasting lipid.  Fenofibrate renewed  Will get her DEXA done today  Her primary concern was her neck and shoulder pain however.  Her physical exam was notable for reduced range of motion associated with pain in extension, flexion and rotation to the left.  I placed a referral to physical therapy to see if we might assist with this.  Because of her CKD 3, we are limited in the medications that we can prescribe.  Advised against NSAIDs.  Okay to continue Tylenol arthritis up to 3 times daily if needed.  I did offer corticosteroid but she wanted to hold off on that.  Muscle relaxer was sent.  Caution sedation.  Plain films ordered to further evaluate given what sounds like radicular symptoms down the right arm.  We will plan for referral to spine specialist pending these imaging studies and response to physical therapy  Would like to see her back in the next 2 to 3 months for recheck, sooner if concerns arise  No orders of the defined types were placed in this encounter.  No orders of the  defined types were placed in this encounter.    [name], DO [place] [phone]

## 2021-02-10 NOTE — Patient Instructions (Signed)
Muscle relaxer sent for severe pain.  It may make you sleepy Ok to take Tylenol arthritis up to 3 times daily if needed.  Referral to physical therapy next door has been placed.  Will call you once xray has been read.  If symptoms don't get better, next step will be the neck specialist.  Sugar looks good.  Get your eye exam done.

## 2021-02-11 LAB — RENAL FUNCTION PANEL
Albumin: 5.1 g/dL — ABNORMAL HIGH (ref 3.8–4.8)
BUN/Creatinine Ratio: 29 — ABNORMAL HIGH (ref 12–28)
BUN: 29 mg/dL — ABNORMAL HIGH (ref 8–27)
CO2: 23 mmol/L (ref 20–29)
Calcium: 10 mg/dL (ref 8.7–10.3)
Chloride: 102 mmol/L (ref 96–106)
Creatinine, Ser: 1 mg/dL (ref 0.57–1.00)
Glucose: 123 mg/dL — ABNORMAL HIGH (ref 70–99)
Phosphorus: 3.9 mg/dL (ref 3.0–4.3)
Potassium: 4.7 mmol/L (ref 3.5–5.2)
Sodium: 141 mmol/L (ref 134–144)
eGFR: 61 mL/min/{1.73_m2} (ref >59)

## 2021-02-13 ENCOUNTER — Other Ambulatory Visit: Payer: Self-pay | Admitting: *Deleted

## 2021-02-13 DIAGNOSIS — M19019 Primary osteoarthritis, unspecified shoulder: Secondary | ICD-10-CM

## 2021-02-25 DIAGNOSIS — G4733 Obstructive sleep apnea (adult) (pediatric): Secondary | ICD-10-CM | POA: Diagnosis not present

## 2021-03-06 ENCOUNTER — Ambulatory Visit: Payer: Medicare HMO | Admitting: Orthopedic Surgery

## 2021-03-06 ENCOUNTER — Encounter: Payer: Self-pay | Admitting: Orthopedic Surgery

## 2021-03-06 VITALS — BP 143/94 | HR 70 | Ht 62.0 in | Wt 166.0 lb

## 2021-03-06 DIAGNOSIS — M19011 Primary osteoarthritis, right shoulder: Secondary | ICD-10-CM

## 2021-03-06 DIAGNOSIS — S161XXA Strain of muscle, fascia and tendon at neck level, initial encounter: Secondary | ICD-10-CM

## 2021-03-06 NOTE — Progress Notes (Signed)
New Patient Visit  Assessment: Nichole Cunningham is a 71 y.o. female with the following: 1. Arthritis of right acromioclavicular joint 2. Strain of neck muscle, initial encounter  Plan: Patient has arthritis of the [facility] joint of the right shoulder.  Limited pain in this area.  Discomfort is primarily located within the musculature of the medial aspect of the right scapula.  This extends cranially into her neck.  Current discomfort most likely associated with muscle spasm and/or a muscle injury of the right side of her neck, and into her trapezius muscles.  She has tried medications, including muscle relaxers limited improvement.  I recommended physical therapy to help work on the pain that she is currently experiencing.  I do not think that an injection or similar treatment of the right shoulder is indicated at this time.  Follow-up as needed.   Follow-up: Return if symptoms worsen or fail to improve.  Subjective:  Chief Complaint  Patient presents with   Shoulder Pain    RT/ painful x 2 mths/no known injury     History of Present Illness: Nichole Cunningham is a 71 y.o. RHD female who has been referred to clinic today by Ronnie Doss, DO for evaluation of right shoulder pain.  She localizes the pain to her upper back, on the right side, extending into her neck and trapezius muscle.  No specific injury.  She had radiographs which demonstrates some AC joint arthritis.  Limited pain in the shoulder.  Some discomfort in the superior aspect of her right shoulder.  She is tried muscle relaxers, as well as NSAIDs.  Limited improvement in her discomfort.  Pain is been ongoing for the past 2 months.  No specific injury.   Review of Systems: No fevers or chills No numbness or tingling No chest pain No shortness of breath No bowel or bladder dysfunction No GI distress No headaches   Medical History:  Past Medical History:  Diagnosis Date   Complication of anesthesia    Hyperlipidemia     PONV (postoperative nausea and vomiting)     Past Surgical History:  Procedure Laterality Date   CHOLECYSTECTOMY  04/13/2011   Procedure: LAPAROSCOPIC CHOLECYSTECTOMY;  Surgeon: Donato Heinz, MD;  Location: [facility];  Service: General;  Laterality: N/A;   [place]   SKIN LESION EXCISION  02/2018   mole removed from forehead     Family History  Problem Relation Age of Onset   Breast cancer Maternal Aunt    Cancer Mother    Heart disease Father    Heart attack Father        cause of death   Cancer Sister    Colon cancer Sister        dx 2020   Cancer Brother        metastatic w/ uncertain primary dx 7741   Anesthesia problems Neg Hx    Hypotension Neg Hx    Malignant hyperthermia Neg Hx    Pseudochol deficiency Neg Hx    Stomach cancer Neg Hx    Social History   Tobacco Use   Smoking status: Never   Smokeless tobacco: Never  Vaping Use   Vaping Use: Never used  Substance Use Topics   Alcohol use: No   Drug use: No    No Known Allergies  Current Meds  Medication Sig   Cholecalciferol 50 MCG (2000 UT) TABS Take 1 tablet by mouth daily.   Doxepin HCl  3 MG TABS Take 1 tablet (3 mg total) by mouth at bedtime as needed (sleep).   fenofibrate 160 MG tablet TAKE 1 TABLET (160 MG TOTAL) DAILY BY MOUTH.   fluticasone (FLONASE) 50 MCG/ACT nasal spray PLACE 1 SPRAY INTO BOTH NOSTRILS 2 (TWO) TIMES DAILY AS NEEDED FOR ALLERGIES OR RHINITIS.   glucose blood test strip Test BGs daily as needed E11.9 (one touch verio reflect)   Lancet Device MISC Test BGs once daily as needed E11.9 (one touch verio reflect)   Lancets (ONETOUCH DELICA PLUS [id number]) MISC Apply topically daily as needed.   lidocaine (LIDODERM) 5 % Place 1 patch onto the skin daily. Remove & Discard patch within 12 hours or as directed by MD (for back pain)   Omega-3 Fatty Acids (OMEGA 3 PO) Take by mouth.   rosuvastatin (CRESTOR) 10 MG tablet Take 1 tablet (10 mg total) by mouth  daily.   tiZANidine (ZANAFLEX) 4 MG tablet Take 0.5-1 tablets (2-4 mg total) by mouth every 8 (eight) hours as needed for muscle spasms.    Objective: BP (!) 143/94    Pulse 70    Ht 5\' 2"  (1.575 m)    Wt 166 lb (75.3 kg)    SpO2 100%    BMI 30.36 kg/m   Physical Exam:  General: Alert and oriented. and No acute distress. Gait: Normal gait.  Evaluation of right shoulder demonstrates no deformity.  No ecchymosis is appreciated.  Tenderness to palpation within the trapezius, as well as the upper back and lower neck musculature, primarily on the right side.  Minimal discomfort in the superior shoulder with cross body adduction.  Near full range of motion of the right shoulder.  Strength is 5/5.  IMAGING: I personally reviewed images previously obtained in clinic  X-ray of the right shoulder demonstrates maintained glenohumeral joint space.  There are degenerative changes of the [facility] joint, and with some mild osteophytes, and cysts in both the distal extent of the clavicle, as well as the acromion.  New Medications:  No orders of the defined types were placed in this encounter.     Mordecai Rasmussen, MD  03/06/2021 11:04 PM

## 2021-03-08 ENCOUNTER — Ambulatory Visit: Payer: Medicare HMO | Admitting: Family Medicine

## 2021-03-09 ENCOUNTER — Encounter: Payer: Self-pay | Admitting: Physical Therapy

## 2021-03-09 ENCOUNTER — Other Ambulatory Visit: Payer: Self-pay

## 2021-03-09 ENCOUNTER — Ambulatory Visit: Payer: Medicare HMO | Attending: Orthopedic Surgery | Admitting: Physical Therapy

## 2021-03-09 DIAGNOSIS — S161XXA Strain of muscle, fascia and tendon at neck level, initial encounter: Secondary | ICD-10-CM | POA: Diagnosis not present

## 2021-03-09 DIAGNOSIS — M19011 Primary osteoarthritis, right shoulder: Secondary | ICD-10-CM | POA: Diagnosis not present

## 2021-03-09 DIAGNOSIS — M542 Cervicalgia: Secondary | ICD-10-CM | POA: Diagnosis not present

## 2021-03-09 NOTE — Therapy (Signed)
[facility] [place], [place], [address] Phone: [phone]   Fax:  [phone]  Physical Therapy Evaluation  Patient Details  Name: Nichole Cunningham MRN: [id number] Date of Birth: 08-27-1950 Referring Provider (PT): Larena Glassman MD   Encounter Date: 03/09/2021   PT End of Session - 03/09/21 1606     Visit Number 1    Number of Visits 8    Date for PT Re-Evaluation 06/07/21    Authorization Type FOTO AT LEAST EVERY 5TH VISIT.  PROGRESS NOTE AT 10TH VISIT.  KX MODIFIER AFTER 15 VISITS.    PT Start Time 0305    PT Stop Time 0351    PT Time Calculation (min) 46 min    Activity Tolerance Patient tolerated treatment well    Behavior During Therapy WFL for tasks assessed/performed             Past Medical History:  Diagnosis Date   Complication of anesthesia    Hyperlipidemia    PONV (postoperative nausea and vomiting)     Past Surgical History:  Procedure Laterality Date   CHOLECYSTECTOMY  04/13/2011   Procedure: LAPAROSCOPIC CHOLECYSTECTOMY;  Surgeon: Donato Heinz, MD;  Location: [facility];  Service: General;  Laterality: N/A;   [place]  2003   [place]   SKIN LESION EXCISION  02/2018   mole removed from forehead     There were no vitals filed for this visit.    Subjective Assessment - 03/09/21 1529     Subjective COVID-19 screen performed prior to patient entering clinic.  The patient presents to the clinic today with a CC of right sided neck pain that came on about two months ago for no apparent reason.  Her pain at rest today is a 2/10 but can rise to as high as 10/10 at times.  Prolonged sitting can increase her pain.  She finds Tylenol arthritis medication helps decrease her pain.  She reports her neck pops and cracks at times.  She reports her pain is constant.    Pertinent History DM.    Limitations Sitting    How long can you sit comfortably? 5-10 minutes.    Diagnostic tests X-rays.     Patient Stated Goals Get out of pain.    Currently in Pain? Yes    Pain Score 2     Pain Location Neck    Pain Orientation Right    Pain Descriptors / Indicators Aching    Pain Type Acute pain    Pain Onset More than a month ago    Pain Frequency Constant    Aggravating Factors  See above.    Pain Relieving Factors See above.                [facility] PT Assessment - 03/09/21 0001       Assessment   Medical Diagnosis Strain of neck muscle.    Referring Provider (PT) Larena Glassman MD    Onset Date/Surgical Date --   ~2 months.   Hand Dominance Right      Precautions   Precautions None      Restrictions   Weight Bearing Restrictions No      Balance Screen   Has the patient fallen in the past 6 months No    Has the patient had a decrease in activity level because of a fear of falling?  No    Is the patient reluctant to leave their home because of a  fear of falling?  No      Home Environment   Living Environment Private residence      Prior Function   Level of Independence Independent      Observation/Other Assessments   Focus on Therapeutic Outcomes (FOTO)  Completed.      Posture/Postural Control   Posture/Postural Control Postural limitations    Postural Limitations Rounded Shoulders;Forward head      Deep Tendon Reflexes   DTR Assessment Site Biceps;Brachioradialis;Triceps    Biceps DTR 2+    Brachioradialis DTR 2+    Triceps DTR 2+      ROM / Strength   AROM / PROM / Strength AROM;Strength      AROM   Overall AROM Comments Active right cervical rotation is 50 degrees and left is 40 degrees.      Strength   Overall Strength Comments Normal bilateral UE strength.      Palpation   Palpation comment Tender to palpation over right cervical paraspinal musculature and right UT.      Ambulation/Gait   Gait Comments WNL.                        Objective measurements completed on examination: See above findings.       [facility] Adult PT  Treatment/Exercise - 03/09/21 0001       Modalities   Modalities Electrical Stimulation;Moist Heat      Moist Heat Therapy   Number Minutes Moist Heat 20 Minutes    Moist Heat Location --   RT UT.     Acupuncturist Location Right cervical/UT region.    Electrical Stimulation Action IFC at 80-150 Hz.    Electrical Stimulation Parameters 40% scan x 20 minutes.    Electrical Stimulation Goals Pain;Tone                          PT Long Term Goals - 03/09/21 1611       PT LONG TERM GOAL #1   Title Independent with a HEP.    Time 4    Period Weeks    Status New      PT LONG TERM GOAL #2   Title Increase active cervical rotation to 60 degrees+ bilaterally so patient can turn head more easily while driving.    Time 4    Period Weeks    Status New      PT LONG TERM GOAL #3   Title Sit 20 minutes with neck pain not > 2-3/10.    Time 4    Period Weeks    Status New      PT LONG TERM GOAL #4   Title Perform ADL's with neck pain not > 2-3/10.    Time 4    Period Weeks    Status New                    Plan - 03/09/21 1601     Clinical Impression Statement The patient presents to [facility] with c/o right sided neck pain that has began about two months ago for no apparent reason.  She was found to be quite tender to palpation over her right cervical paraspinal musculature and UT.  She has significant losses of bilateral active cervical rotation.  Her UE DTR's are normal as is her UE strength.  Sitting can increase her pain.  She reports her pain can become  severe at times.  Patient will benefit from skilled physical therapy intervention to address pain and deficits.    Personal Factors and Comorbidities Other    Examination-Activity Limitations Sit;Other    Examination-Participation Restrictions Other    Stability/Clinical Decision Making Stable/Uncomplicated    Clinical Decision Making Low    Rehab Potential Excellent     PT Frequency 2x / week    PT Duration 4 weeks    PT Treatment/Interventions ADLs/Self Care Home Management;Cryotherapy;Electrical Stimulation;Ultrasound;Moist Heat;Therapeutic activities;Therapeutic exercise;Manual techniques;Patient/family education;Passive range of motion;Dry needling    PT Next Visit Plan Chin tucks and cervical extension, postural exercises.  Combo e'stim/US, STW/M, HMP and E' Stim.    Consulted and Agree with Plan of Care Patient             Patient will benefit from skilled therapeutic intervention in order to improve the following deficits and impairments:  Pain, Increased muscle spasms, Decreased activity tolerance, Decreased range of motion, Postural dysfunction  Visit Diagnosis: Cervicalgia - Plan: PT plan of care cert/re-cert     Problem List Patient Active Problem List   Diagnosis Date Noted   Hypercholesterolemia 10/07/2020   Hyperlipidemia associated with type 2 diabetes mellitus ([place]) 09/06/2020   Chronic kidney disease, stage 3a ([place]) 05/06/2020   Hyperlipidemia with target LDL less than 100 10/01/2014   Insomnia 10/01/2014   BMI 29.0-29.9,adult 10/01/2014    Metzli Pollick, [place], PT 03/09/2021, 4:14 PM  [facility] [address] [place], [place], [address] Phone: [phone]   Fax:  [phone]  Name: ESSANCE GATTI MRN: [id number] Date of Birth: 04-02-1950

## 2021-03-09 NOTE — Patient Instructions (Signed)
[place] EXERCISE PROGRAM Created by [place] Jen Benedict Jan 12th, 2023 View at [email or web address] using code: [id number] Total 2 Page 1 of 1 RETRACTION / CHIN TUCK Slowly draw your head back so that your ears line up with your shoulders. Repeat 10 Times Hold 5 Seconds Complete 1 Set Perform 6 Times a Day CERVICAL EXTENSION Tilt your head upwards, then return back to looking straight ahead. Repeat 10 Times Hold 5 Seconds Complete 1 Set Perform 6 Times a Day

## 2021-03-23 ENCOUNTER — Encounter: Payer: Medicare HMO | Admitting: Physical Therapy

## 2021-03-28 DIAGNOSIS — G4733 Obstructive sleep apnea (adult) (pediatric): Secondary | ICD-10-CM | POA: Diagnosis not present

## 2021-04-18 DIAGNOSIS — L255 Unspecified contact dermatitis due to plants, except food: Secondary | ICD-10-CM | POA: Diagnosis not present

## 2021-04-25 DIAGNOSIS — G4733 Obstructive sleep apnea (adult) (pediatric): Secondary | ICD-10-CM | POA: Diagnosis not present

## 2021-05-02 ENCOUNTER — Other Ambulatory Visit: Payer: Self-pay | Admitting: Family Medicine

## 2021-05-02 LAB — HM DIABETES EYE EXAM

## 2021-05-03 DIAGNOSIS — Z0100 Encounter for examination of eyes and vision without abnormal findings: Secondary | ICD-10-CM | POA: Diagnosis not present

## 2021-05-05 DIAGNOSIS — Z20822 Contact with and (suspected) exposure to covid-19: Secondary | ICD-10-CM | POA: Diagnosis not present

## 2021-05-05 DIAGNOSIS — B349 Viral infection, unspecified: Secondary | ICD-10-CM | POA: Diagnosis not present

## 2021-05-15 ENCOUNTER — Ambulatory Visit (INDEPENDENT_AMBULATORY_CARE_PROVIDER_SITE_OTHER): Payer: Medicare HMO | Admitting: Family Medicine

## 2021-05-15 ENCOUNTER — Encounter: Payer: Self-pay | Admitting: Family Medicine

## 2021-05-15 VITALS — BP 134/83 | HR 113 | Temp 97.7°F | Ht 62.0 in | Wt 168.2 lb

## 2021-05-15 DIAGNOSIS — J40 Bronchitis, not specified as acute or chronic: Secondary | ICD-10-CM | POA: Diagnosis not present

## 2021-05-15 DIAGNOSIS — J329 Chronic sinusitis, unspecified: Secondary | ICD-10-CM | POA: Diagnosis not present

## 2021-05-15 MED ORDER — PREDNISONE 10 MG PO TABS: ORAL_TABLET | ORAL | 0 refills | Status: DC

## 2021-05-15 MED ORDER — LEVOFLOXACIN 500 MG PO TABS: 500.0000 mg | ORAL_TABLET | Freq: Every day | ORAL | 0 refills | Status: DC

## 2021-05-15 MED ORDER — CHERATUSSIN AC 100-10 MG/5ML PO SOLN: 5.0000 mL | ORAL | 0 refills | Status: DC | PRN

## 2021-05-15 NOTE — Progress Notes (Signed)
Chief Complaint  ?Patient presents with  ? Cough  ?  X 3 wks, went to Urgent Care 2 wks ago Friday, feels like causing shoting pains in head  ? Fatigue  ?   ?  ? ? ?HPI ? ?Patient presents today for Patient presents with upper respiratory congestion. Patient reports coughing frequently as well.  Minimal, occasional sputum noted. There is no fever, chills or sweats. The patient denies being short of breath. Onset was 3 weeks ago. Gradually worsening. Tried OTCs without improvement. Benzonaatate not helpful. No body aches, No Drainage. Has pain in central forehead. No appetite. Energy is poor.  ? ?PMH: Smoking status noted ?ROS: Per HPI ? ?Objective: ?BP 134/83   Pulse (!) 113   Temp 97.7 ?F (36.5 ?C) (Temporal)   Ht '5\' 2"'$  (1.575 m)   Wt 168 lb 3.2 oz (76.3 kg)   SpO2 92%   BMI 30.76 kg/m?  ?Gen: NAD, alert, cooperative with exam ?HEENT: NCAT, Nasal passages swollen, Pharynx clear, MMM ?CV: RRR, good S1/S2, no murmur ?Resp: Bronchitic changes with scattered wheezes, non-labored ?Ext: No edema, warm ?Neuro: Alert and oriented, No gross deficits ? ?Assessment and plan: ? ?1. Sinobronchitis   ? ? ?Meds ordered this encounter  ?Medications  ? levofloxacin (LEVAQUIN) 500 MG tablet  ?  Sig: Take 1 tablet (500 mg total) by mouth daily. For 10 days  ?  Dispense:  10 tablet  ?  Refill:  0  ? predniSONE (DELTASONE) 10 MG tablet  ?  Sig: Take 5 daily for 2 days followed by 4,3,2 and 1 for 2 days each.  ?  Dispense:  30 tablet  ?  Refill:  0  ? guaiFENesin-codeine (CHERATUSSIN AC) 100-10 MG/5ML syrup  ?  Sig: Take 5 mLs by mouth every 4 (four) hours as needed for cough.  ?  Dispense:  180 mL  ?  Refill:  0  ? ? ?Orders Placed This Encounter  ?Procedures  ? Novel Coronavirus, NAA ([facility])  ?  Order Specific Question:   Previously tested for COVID-19  ?  Answer:   Yes  ?  Order Specific Question:   Resident in a congregate (group) care setting  ?  Answer:   No  ?  Order Specific Question:   Is the patient student?  ?   Answer:   No  ?  Order Specific Question:   Employed in healthcare setting  ?  Answer:   No  ?  Order Specific Question:   Pregnant  ?  Answer:   No  ?  Order Specific Question:   Has patient completed COVID vaccination(s) (2 doses of Pfizer/Moderna 1 dose of [employer])  ?  Answer:   Unknown  ?  Order Specific Question:   Release to patient  ?  Answer:   Immediate  ? ? ?Follow up as needed. ? ?Claretta Fraise, MD ? ?

## 2021-05-16 LAB — NOVEL CORONAVIRUS, NAA: SARS-CoV-2, NAA: NOT DETECTED

## 2021-06-14 DIAGNOSIS — Z1231 Encounter for screening mammogram for malignant neoplasm of breast: Secondary | ICD-10-CM | POA: Diagnosis not present

## 2021-09-05 DIAGNOSIS — Z08 Encounter for follow-up examination after completed treatment for malignant neoplasm: Secondary | ICD-10-CM | POA: Diagnosis not present

## 2021-09-05 DIAGNOSIS — Z85828 Personal history of other malignant neoplasm of skin: Secondary | ICD-10-CM | POA: Diagnosis not present

## 2021-09-05 DIAGNOSIS — L728 Other follicular cysts of the skin and subcutaneous tissue: Secondary | ICD-10-CM | POA: Diagnosis not present

## 2021-09-05 DIAGNOSIS — L578 Other skin changes due to chronic exposure to nonionizing radiation: Secondary | ICD-10-CM | POA: Diagnosis not present

## 2021-09-18 ENCOUNTER — Other Ambulatory Visit: Payer: Self-pay | Admitting: Family Medicine

## 2021-09-18 DIAGNOSIS — E1169 Type 2 diabetes mellitus with other specified complication: Secondary | ICD-10-CM

## 2021-09-19 ENCOUNTER — Other Ambulatory Visit: Payer: Self-pay | Admitting: Family Medicine

## 2021-09-19 DIAGNOSIS — E119 Type 2 diabetes mellitus without complications: Secondary | ICD-10-CM

## 2021-10-10 ENCOUNTER — Ambulatory Visit (INDEPENDENT_AMBULATORY_CARE_PROVIDER_SITE_OTHER): Payer: Medicare HMO

## 2021-10-10 VITALS — Wt 165.0 lb

## 2021-10-10 DIAGNOSIS — Z0000 Encounter for general adult medical examination without abnormal findings: Secondary | ICD-10-CM | POA: Diagnosis not present

## 2021-10-10 NOTE — Patient Instructions (Signed)
Nichole Cunningham , Thank you for taking time to come for your Medicare Wellness Visit. I appreciate your ongoing commitment to your health goals. Please review the following plan we discussed and let me know if I can assist you in the future.   Screening recommendations/referrals: Colonoscopy: Done 03/14/2012 - Repeat in 10 years  Mammogram: Done 06/14/2021 - Repeat annually  Bone Density: Done 02/10/2021 - Repeat every 2 years  Recommended yearly ophthalmology/optometry visit for glaucoma screening and checkup Recommended yearly dental visit for hygiene and checkup  Vaccinations: Influenza vaccine: Done 02/10/2021 - Repeat annually  Pneumococcal vaccine: Done 01/07/2017 & 04/07/2018 Tdap vaccine: Done 05/30/1999 - recommended every 10 years - DUE** Shingles vaccine: Due - Shingrix is 2 doses 2-6 months apart and over 90% effective     Covid-19:Done  04/24/2019, 05/27/2019, & 05/20/2020  Advanced directives: Please bring a copy of your health care power of attorney and living will to the office to be added to your chart at your convenience.   Conditions/risks identified: Aim for 30 minutes of exercise or brisk walking, 6-8 glasses of water, and 5 servings of fruits and vegetables each day.   Next appointment: Follow up in one year for your annual wellness visit    Preventive Care 65 Years and Older, Female Preventive care refers to lifestyle choices and visits with your health care provider that can promote health and wellness. What does preventive care include? A yearly physical exam. This is also called an annual well check. Dental exams once or twice a year. Routine eye exams. Ask your health care provider how often you should have your eyes checked. Personal lifestyle choices, including: Daily care of your teeth and gums. Regular physical activity. Eating a healthy diet. Avoiding tobacco and drug use. Limiting alcohol use. Practicing safe sex. Taking low-dose aspirin every day. Taking  vitamin and mineral supplements as recommended by your health care provider. What happens during an annual well check? The services and screenings done by your health care provider during your annual well check will depend on your age, overall health, lifestyle risk factors, and family history of disease. Counseling  Your health care provider may ask you questions about your: Alcohol use. Tobacco use. Drug use. Emotional well-being. Home and relationship well-being. Sexual activity. Eating habits. History of falls. Memory and ability to understand (cognition). Work and work Statistician. Reproductive health. Screening  You may have the following tests or measurements: Height, weight, and BMI. Blood pressure. Lipid and cholesterol levels. These may be checked every 5 years, or more frequently if you are over 23 years old. Skin check. Lung cancer screening. You may have this screening every year starting at age 10 if you have a 30-pack-year history of smoking and currently smoke or have quit within the past 15 years. Fecal occult blood test (FOBT) of the stool. You may have this test every year starting at age 13. Flexible sigmoidoscopy or colonoscopy. You may have a sigmoidoscopy every 5 years or a colonoscopy every 10 years starting at age 43. Hepatitis C blood test. Hepatitis B blood test. Sexually transmitted disease (STD) testing. Diabetes screening. This is done by checking your blood sugar (glucose) after you have not eaten for a while (fasting). You may have this done every 1-3 years. Bone density scan. This is done to screen for osteoporosis. You may have this done starting at age 69. Mammogram. This may be done every 1-2 years. Talk to your health care provider about how often you should have  regular mammograms. Talk with your health care provider about your test results, treatment options, and if necessary, the need for more tests. Vaccines  Your health care provider may  recommend certain vaccines, such as: Influenza vaccine. This is recommended every year. Tetanus, diphtheria, and acellular pertussis (Tdap, Td) vaccine. You may need a Td booster every 10 years. Zoster vaccine. You may need this after age 61. Pneumococcal 13-valent conjugate (PCV13) vaccine. One dose is recommended after age 87. Pneumococcal polysaccharide (PPSV23) vaccine. One dose is recommended after age 72. Talk to your health care provider about which screenings and vaccines you need and how often you need them. This information is not intended to replace advice given to you by your health care provider. Make sure you discuss any questions you have with your health care provider. Document Released: 03/11/2015 Document Revised: 11/02/2015 Document Reviewed: 12/14/2014 Elsevier Interactive Patient Education  2017 [employer] Prevention in the Home Falls can cause injuries. They can happen to people of all ages. There are many things you can do to make your home safe and to help prevent falls. What can I do on the outside of my home? Regularly fix the edges of walkways and driveways and fix any cracks. Remove anything that might make you trip as you walk through a door, such as a raised step or threshold. Trim any bushes or trees on the path to your home. Use bright outdoor lighting. Clear any walking paths of anything that might make someone trip, such as rocks or tools. Regularly check to see if handrails are loose or broken. Make sure that both sides of any steps have handrails. Any raised decks and porches should have guardrails on the edges. Have any leaves, snow, or ice cleared regularly. Use sand or salt on walking paths during winter. Clean up any spills in your garage right away. This includes oil or grease spills. What can I do in the bathroom? Use night lights. Install grab bars by the toilet and in the tub and shower. Do not use towel bars as grab bars. Use non-skid  mats or decals in the tub or shower. If you need to sit down in the shower, use a plastic, non-slip stool. Keep the floor dry. Clean up any water that spills on the floor as soon as it happens. Remove soap buildup in the tub or shower regularly. Attach bath mats securely with double-sided non-slip rug tape. Do not have throw rugs and other things on the floor that can make you trip. What can I do in the bedroom? Use night lights. Make sure that you have a light by your bed that is easy to reach. Do not use any sheets or blankets that are too big for your bed. They should not hang down onto the floor. Have a firm chair that has side arms. You can use this for support while you get dressed. Do not have throw rugs and other things on the floor that can make you trip. What can I do in the kitchen? Clean up any spills right away. Avoid walking on wet floors. Keep items that you use a lot in easy-to-reach places. If you need to reach something above you, use a strong step stool that has a grab bar. Keep electrical cords out of the way. Do not use floor polish or wax that makes floors slippery. If you must use wax, use non-skid floor wax. Do not have throw rugs and other things on the floor that  can make you trip. What can I do with my stairs? Do not leave any items on the stairs. Make sure that there are handrails on both sides of the stairs and use them. Fix handrails that are broken or loose. Make sure that handrails are as long as the stairways. Check any carpeting to make sure that it is firmly attached to the stairs. Fix any carpet that is loose or worn. Avoid having throw rugs at the top or bottom of the stairs. If you do have throw rugs, attach them to the floor with carpet tape. Make sure that you have a light switch at the top of the stairs and the bottom of the stairs. If you do not have them, ask someone to add them for you. What else can I do to help prevent falls? Wear shoes  that: Do not have high heels. Have rubber bottoms. Are comfortable and fit you well. Are closed at the toe. Do not wear sandals. If you use a stepladder: Make sure that it is fully opened. Do not climb a closed stepladder. Make sure that both sides of the stepladder are locked into place. Ask someone to hold it for you, if possible. Clearly mark and make sure that you can see: Any grab bars or handrails. First and last steps. Where the edge of each step is. Use tools that help you move around (mobility aids) if they are needed. These include: Canes. Walkers. Scooters. Crutches. Turn on the lights when you go into a dark area. Replace any light bulbs as soon as they burn out. Set up your furniture so you have a clear path. Avoid moving your furniture around. If any of your floors are uneven, fix them. If there are any pets around you, be aware of where they are. Review your medicines with your doctor. Some medicines can make you feel dizzy. This can increase your chance of falling. Ask your doctor what other things that you can do to help prevent falls. This information is not intended to replace advice given to you by your health care provider. Make sure you discuss any questions you have with your health care provider. Document Released: 12/09/2008 Document Revised: 07/21/2015 Document Reviewed: 03/19/2014 Elsevier Interactive Patient Education  2017 [employer].

## 2021-10-10 NOTE — Progress Notes (Signed)
Subjective:   Nichole Cunningham is a 71 y.o. female who presents for Medicare Annual (Subsequent) preventive examination.  Virtual Visit via Telephone Note  I connected with  Nichole Cunningham on 10/10/21 at  8:15 AM EDT by telephone and verified that I am speaking with the correct person using two identifiers.  Location: Patient: Home Provider: [facility] Persons participating in the virtual visit: patient/Nurse Health Advisor   I discussed the limitations, risks, security and privacy concerns of performing an evaluation and management service by telephone and the availability of in person appointments. The patient expressed understanding and agreed to proceed.  Interactive audio and video telecommunications were attempted between this nurse and patient, however failed, due to patient having technical difficulties OR patient did not have access to video capability.  We continued and completed visit with audio only.  Some vital signs may be absent or patient reported.   Nichole Cunningham E Nichole Gethers, LPN   Review of Systems     Cardiac Risk Factors include: advanced age (>60mn, >>61women);sedentary lifestyle;dyslipidemia;obesity (BMI >30kg/m2)     Objective:    Today's Vitals   10/10/21 0819  Weight: 165 lb (74.8 kg)   Body mass index is 30.18 kg/m.     10/10/2021    8:29 AM 03/09/2021    3:28 PM 10/07/2020    8:26 AM 10/07/2019    8:26 AM 09/30/2018    8:39 AM 04/11/2011    1:48 PM  Advanced Directives  Does Patient Have a Medical Advance Directive? Yes No Yes Yes No Patient does not have advance directive;Patient would not like information  Type of AScientist, forensicPower of ABel Air SouthLiving will  [place] will [place]   Does patient want to make changes to medical advance directive?    No - Patient declined    Copy of [place] Chart? No - copy requested  No - copy requested No - copy requested    Would patient like  information on creating a medical advance directive?    No - Patient declined No - Patient declined   Pre-existing out of facility DNR order (yellow form or pink MOST form)      No    Current Medications (verified) Outpatient Encounter Medications as of 10/10/2021  Medication Sig   Cholecalciferol 50 MCG (2000 UT) TABS Take 1 tablet by mouth daily.   fenofibrate 160 MG tablet TAKE 1 TABLET (160 MG TOTAL) DAILY BY MOUTH.   glucose blood (ONETOUCH VERIO) test strip TEST BLOOD GLUCOSE DAILY AS NEEDED E11.9 (ONE TOUCH VERIO REFLECT)   Lancet Device MISC Test BGs once daily as needed E11.9 (one touch verio reflect)   Lancets (ONETOUCH DELICA PLUS [id number] MISC Apply topically daily as needed.   Omega-3 Fatty Acids (OMEGA 3 PO) Take by mouth.   rosuvastatin (CRESTOR) 10 MG tablet Take 1 tablet (10 mg total) by mouth daily. (NEEDS TO BE SEEN BEFORE NEXT REFILL)   Doxepin HCl 3 MG TABS Take 1 tablet (3 mg total) by mouth at bedtime as needed (sleep). (Patient not taking: Reported on 10/10/2021)   tiZANidine (ZANAFLEX) 4 MG tablet Take 0.5-1 tablets (2-4 mg total) by mouth every 8 (eight) hours as needed for muscle spasms. (Patient not taking: Reported on 10/10/2021)   [DISCONTINUED] benzonatate (TESSALON) 100 MG capsule Take 1 capsule by mouth every 6 (six) hours as needed.   [DISCONTINUED] fluticasone (FLONASE) 50 MCG/ACT nasal spray PLACE 1 SPRAY INTO BOTH  NOSTRILS 2 (TWO) TIMES DAILY AS NEEDED FOR ALLERGIES OR RHINITIS.   [DISCONTINUED] guaiFENesin-codeine (CHERATUSSIN AC) 100-10 MG/5ML syrup Take 5 mLs by mouth every 4 (four) hours as needed for cough.   [DISCONTINUED] levofloxacin (LEVAQUIN) 500 MG tablet Take 1 tablet (500 mg total) by mouth daily. For 10 days   [DISCONTINUED] lidocaine (LIDODERM) 5 % Place 1 patch onto the skin daily. Remove & Discard patch within 12 hours or as directed by MD (for back pain)   [DISCONTINUED] predniSONE (DELTASONE) 10 MG tablet Take 5 daily for 2 days followed by  4,3,2 and 1 for 2 days each.   No facility-administered encounter medications on file as of 10/10/2021.    Allergies (verified) Patient has no known allergies.   History: Past Medical History:  Diagnosis Date   Complication of anesthesia    Hyperlipidemia    PONV (postoperative nausea and vomiting)    Past Surgical History:  Procedure Laterality Date   CHOLECYSTECTOMY  04/13/2011   Procedure: LAPAROSCOPIC CHOLECYSTECTOMY;  Surgeon: Donato Heinz, MD;  Location: [facility];  Service: General;  Laterality: N/A;   [place]  2003   Nichole Cunningham   SKIN LESION EXCISION  02/2018   mole removed from forehead    Family History  Problem Relation Age of Onset   Breast cancer Maternal Aunt    Cancer Mother    Heart disease Father    Heart attack Father        cause of death   Cancer Sister    Colon cancer Sister        dx 2020   Cancer Brother        metastatic w/ uncertain primary dx 7412   Anesthesia problems Neg Hx    Hypotension Neg Hx    Malignant hyperthermia Neg Hx    Pseudochol deficiency Neg Hx    Stomach cancer Neg Hx    Social History   Socioeconomic History   Marital status: Married    Spouse name: Nichole Cunningham   Number of children: 2   Years of education: 12   Highest education level: High school graduate  Occupational History   Occupation: retired  Tobacco Use   Smoking status: Never   Smokeless tobacco: Never  Vaping Use   Vaping Use: Never used  Substance and Sexual Activity   Alcohol use: No   Drug use: No   Sexual activity: Yes    Birth control/protection: Post-menopausal  Other Topics Concern   Not on file  Social History Narrative   Not on file   Social Determinants of Health   Financial Resource Strain: Low Risk  (10/10/2021)   Overall Financial Resource Strain (CARDIA)    Difficulty of Paying Living Expenses: Not hard at all  Food Insecurity: No Food Insecurity (10/10/2021)   Hunger Vital Sign    Worried About Running Out of Food in  the Last Year: Never true    [place] in the Last Year: Never true  Transportation Needs: No Transportation Needs (10/10/2021)   PRAPARE - Hydrologist (Medical): No    Lack of Transportation (Non-Medical): No  Physical Activity: Inactive (10/10/2021)   Exercise Vital Sign    Days of Exercise per Week: 0 days    Minutes of Exercise per Session: 0 min  Stress: No Stress Concern Present (10/10/2021)   [place]    Feeling of Stress : Only a little  Social Connections: Socially Integrated (10/10/2021)   Social Connection and Isolation Panel [NHANES]    Frequency of Communication with Friends and Family: More than three times a week    Frequency of Social Gatherings with Friends and Family: More than three times a week    Attends Religious Services: More than 4 times per year    Active Member of [employer] or Organizations: Yes    Attends Music therapist: More than 4 times per year    Marital Status: Married    Tobacco Counseling Counseling given: Not Answered   Clinical Intake:  Pre-visit preparation completed: Yes  Pain : No/denies pain     BMI - recorded: 30.18 Nutritional Status: BMI > 30  Obese Nutritional Risks: None Diabetes: Yes CBG done?: No Did pt. bring in CBG monitor from home?: No  How often do you need to have someone help you when you read instructions, pamphlets, or other written materials from your doctor or pharmacy?: 1 - Never  Diabetic? Nutrition Risk Assessment:  Has the patient had any N/V/D within the last 2 months?  No  Does the patient have any non-healing wounds?  No  Has the patient had any unintentional weight loss or weight gain?  No   Diabetes:  Is the patient diabetic?  Yes  If diabetic, was a CBG obtained today?  No  Did the patient bring in their glucometer from home?  No  How often do you monitor your CBG's? Twice a week.    Financial Strains and Diabetes Management:  Are you having any financial strains with the device, your supplies or your medication? No .  Does the patient want to be seen by Chronic Care Management for management of their diabetes?  No  Would the patient like to be referred to a Nutritionist or for Diabetic Management?  No   Diabetic Exams:  Diabetic Eye Exam: Completed 06/2021 per pt - we need record.  Diabetic Foot Exam: Completed 09/06/2020. Pt has been advised about the importance in completing this exam. Pt is scheduled for diabetic foot exam on 11/06/2021.    Interpreter Needed?: No  Information entered by :: Sherle Mello, LPN   Activities of Daily Living    10/10/2021    8:26 AM  In your present state of health, do you have any difficulty performing the following activities:  Hearing? 0  Vision? 0  Difficulty concentrating or making decisions? 0  Walking or climbing stairs? 0  Dressing or bathing? 0  Doing errands, shopping? 0  Preparing Food and eating ? N  Using the Toilet? N  In the past six months, have you accidently leaked urine? Y  Do you have problems with loss of bowel control? N  Managing your Medications? N  Managing your Finances? N  Housekeeping or managing your Housekeeping? N    Patient Care Team: [name], DO as PCP - General (Family Medicine) Vania Rea, MD as Attending Physician (Obstetrics and Gynecology) Debbora Presto, NP as Nurse Practitioner (Family Medicine) Celestia Khat, Lozano (Optometry) Allyn Kenner, MD (Dermatology)  Indicate any recent Medical Services you may have received from other than [facility] providers in the past year (date may be approximate).     Assessment:   This is a routine wellness examination for [place].  Hearing/Vision screen Hearing Screening - Comments:: C/o mild hearing difficulties - declines hearing aids Vision Screening - Comments:: Wears rx glasses - up to date with routine eye exams with [facility]  Dietary issues and exercise activities discussed: Current Exercise Habits: The patient does not participate in regular exercise at present, Exercise limited by: None identified   Goals Addressed             This Visit's Progress    Patient Stated   Not on track    10/10/2021 AWV Goal: Exercise for General Health  Patient will verbalize understanding of the benefits of increased physical activity: Exercising regularly is important. It will improve your overall fitness, flexibility, and endurance. Regular exercise also will improve your overall health. It can help you control your weight, reduce stress, and improve your bone density. Over the next year, patient will increase physical activity as tolerated with a goal of at least 150 minutes of moderate physical activity per week.  You can tell that you are exercising at a moderate intensity if your heart starts beating faster and you start breathing faster but can still hold a conversation. Moderate-intensity exercise ideas include: Walking 1 mile (1.6 km) in about 15 minutes Biking Hiking Golfing Dancing Water aerobics Patient will verbalize understanding of everyday activities that increase physical activity by providing examples like the following: Yard work, such as: Sales promotion account executive Gardening Washing windows or floors Patient will be able to explain general safety guidelines for exercising:  Before you start a new exercise program, talk with your health care provider. Do not exercise so much that you hurt yourself, feel dizzy, or get very short of breath. Wear comfortable clothes and wear shoes with good support. Drink plenty of water while you exercise to prevent dehydration or heat stroke. Work out until your breathing and your heartbeat get faster.      Patient Stated       10/10/21 -She hopes to stay busy, camping, caring for  granddaughter, maybe exercise more       Depression Screen    10/10/2021    8:26 AM 05/15/2021    8:12 AM 02/10/2021    8:43 AM 10/07/2020    8:24 AM 06/08/2020    1:18 PM 05/06/2020   10:51 AM 02/16/2020    1:30 PM  PHQ 2/9 Scores  PHQ - 2 Score 0 3 0 0 0 0 0  PHQ- 9 Score  8    0     Fall Risk    10/10/2021    8:22 AM 05/15/2021    8:12 AM 02/10/2021    8:43 AM 10/07/2020    8:26 AM 06/08/2020    1:18 PM  Fall Risk   Falls in the past year? 0 0 0 0 0  Number falls in past yr: 0 0  0 0  Injury with Fall? 0 0  0 0  Risk for fall due to : No Fall Risks No Fall Risks  Impaired vision   Follow up Falls prevention discussed Falls evaluation completed       FALL RISK PREVENTION PERTAINING TO THE HOME:  Any stairs in or around the home? Yes  If so, are there any without handrails? No  Home free of loose throw rugs in walkways, pet beds, electrical cords, etc? Yes  Adequate lighting in your home to reduce risk of falls? Yes   ASSISTIVE DEVICES UTILIZED TO PREVENT FALLS:  Life alert? No  Use of a cane, walker or w/c? No  Grab bars in the bathroom? No  Shower chair or bench in shower? No  Elevated  toilet seat or a handicapped toilet? Yes   TIMED UP AND GO:  Was the test performed? No . Telephonic visit  Cognitive Function:        10/10/2021    8:27 AM 10/07/2020    8:27 AM 10/07/2019    8:29 AM 09/30/2018    8:43 AM  6CIT Screen  What Year? 0 points 0 points 0 points 0 points  What month? 0 points 0 points 0 points 0 points  What time? 0 points 0 points 0 points 0 points  Count back from 20 0 points 0 points 0 points 0 points  Months in reverse 4 points 0 points 4 points 4 points  Repeat phrase 0 points 4 points 4 points 2 points  Total Score 4 points 4 points 8 points 6 points    Immunizations Immunization History  Administered Date(s) Administered   Fluad Quad(high Dose 65+) 02/10/2021   Moderna SARS-COV2 Booster Vaccination 05/20/2020   Moderna Sars-Covid-2  Vaccination 04/24/2019, 05/27/2019   Pneumococcal Conjugate-13 01/07/2017   Pneumococcal Polysaccharide-23 04/07/2018   Td 05/30/1999    TDAP status: Due, Education has been provided regarding the importance of this vaccine. Advised may receive this vaccine at local pharmacy or Health Dept. Aware to provide a copy of the vaccination record if obtained from local pharmacy or Health Dept. Verbalized acceptance and understanding.  Flu Vaccine status: Up to date  Pneumococcal vaccine status: Up to date  Covid-19 vaccine status: Completed vaccines  Qualifies for Shingles Vaccine? Yes   Zostavax completed No   Shingrix Completed?: No.    Education has been provided regarding the importance of this vaccine. Patient has been advised to call insurance company to determine out of pocket expense if they have not yet received this vaccine. Advised may also receive vaccine at local pharmacy or Health Dept. Verbalized acceptance and understanding.  Screening Tests Health Maintenance  Topic Date Due   Zoster Vaccines- Shingrix (1 of 2) Never done   TETANUS/TDAP  05/29/2009   COVID-19 Vaccine (3 - Moderna risk series) 06/17/2020   OPHTHALMOLOGY EXAM  07/06/2021   HEMOGLOBIN A1C  08/11/2021   FOOT EXAM  09/06/2021   URINE MICROALBUMIN  09/06/2021   INFLUENZA VACCINE  09/26/2021   COLONOSCOPY (Pts 45-24yr Insurance coverage will need to be confirmed)  03/14/2022   MAMMOGRAM  06/15/2022   DEXA SCAN  02/11/2023   Pneumonia Vaccine 71 Years old  Completed   Hepatitis C Screening  Completed   HPV VACCINES  Aged Out    Health Maintenance  Health Maintenance Due  Topic Date Due   Zoster Vaccines- Shingrix (1 of 2) Never done   TETANUS/TDAP  05/29/2009   COVID-19 Vaccine (3 - Moderna risk series) 06/17/2020   OPHTHALMOLOGY EXAM  07/06/2021   HEMOGLOBIN A1C  08/11/2021   FOOT EXAM  09/06/2021   URINE MICROALBUMIN  09/06/2021   INFLUENZA VACCINE  09/26/2021    Colorectal cancer screening:  Type of screening: Colonoscopy. Completed 03/14/2012. Repeat every 10 years  Mammogram status: Completed 06/14/2021. Repeat every year  Bone Density status: Completed 02/10/2021. Results reflect: Bone density results: OSTEOPENIA. Repeat every 2 years.  Lung Cancer Screening: (Low Dose CT Chest recommended if Age 71-80years, 30 pack-year currently smoking OR have quit w/in 15years.) does not qualify  Additional Screening:  Hepatitis C Screening: does qualify; Completed 05/25/2015  Vision Screening: Recommended annual ophthalmology exams for early detection of glaucoma and other disorders of the eye. Is the patient up to date  with their annual eye exam?  Yes  Who is the provider or what is the name of the office in which the patient attends annual eye exams? [facility] If pt is not established with a provider, would they like to be referred to a provider to establish care? No .   Dental Screening: Recommended annual dental exams for proper oral hygiene  Community Resource Referral / Chronic Care Management: CRR required this visit?  No   CCM required this visit?  No      Plan:     I have personally reviewed and noted the following in the patient's chart:   Medical and social history Use of alcohol, tobacco or illicit drugs  Current medications and supplements including opioid prescriptions.  Functional ability and status Nutritional status Physical activity Advanced directives List of other physicians Hospitalizations, surgeries, and ER visits in previous 12 months Vitals Screenings to include cognitive, depression, and falls Referrals and appointments  In addition, I have reviewed and discussed with patient certain preventive protocols, quality metrics, and best practice recommendations. A written personalized care plan for preventive services as well as general preventive health recommendations were provided to patient.     Sandrea Hammond, LPN   [id number]   Nurse  Notes: none

## 2021-10-12 ENCOUNTER — Other Ambulatory Visit: Payer: Self-pay | Admitting: Family Medicine

## 2021-10-12 DIAGNOSIS — E1169 Type 2 diabetes mellitus with other specified complication: Secondary | ICD-10-CM

## 2021-10-12 NOTE — Telephone Encounter (Signed)
Appointment 11/06/21.

## 2021-11-06 ENCOUNTER — Encounter: Payer: Self-pay | Admitting: Family Medicine

## 2021-11-06 ENCOUNTER — Ambulatory Visit (INDEPENDENT_AMBULATORY_CARE_PROVIDER_SITE_OTHER): Payer: Medicare HMO | Admitting: Family Medicine

## 2021-11-06 VITALS — BP 151/88 | HR 81 | Temp 96.8°F | Ht 62.0 in | Wt 167.8 lb

## 2021-11-06 DIAGNOSIS — E785 Hyperlipidemia, unspecified: Secondary | ICD-10-CM

## 2021-11-06 DIAGNOSIS — E119 Type 2 diabetes mellitus without complications: Secondary | ICD-10-CM | POA: Diagnosis not present

## 2021-11-06 DIAGNOSIS — Z23 Encounter for immunization: Secondary | ICD-10-CM

## 2021-11-06 DIAGNOSIS — E1169 Type 2 diabetes mellitus with other specified complication: Secondary | ICD-10-CM

## 2021-11-06 DIAGNOSIS — N1831 Chronic kidney disease, stage 3a: Secondary | ICD-10-CM

## 2021-11-06 DIAGNOSIS — M542 Cervicalgia: Secondary | ICD-10-CM | POA: Diagnosis not present

## 2021-11-06 DIAGNOSIS — M722 Plantar fascial fibromatosis: Secondary | ICD-10-CM | POA: Diagnosis not present

## 2021-11-06 LAB — BAYER DCA HB A1C WAIVED: HB A1C (BAYER DCA - WAIVED): 7 % — ABNORMAL HIGH (ref 4.8–5.6)

## 2021-11-06 MED ORDER — TIZANIDINE HCL 4 MG PO TABS: 2.0000 mg | ORAL_TABLET | Freq: Three times a day (TID) | ORAL | 1 refills | Status: DC | PRN

## 2021-11-06 MED ORDER — ROSUVASTATIN CALCIUM 10 MG PO TABS: 10.0000 mg | ORAL_TABLET | Freq: Every day | ORAL | 3 refills | Status: DC

## 2021-11-06 MED ORDER — LANCET DEVICE MISC: 12 refills | Status: DC

## 2021-11-06 MED ORDER — ONETOUCH VERIO VI STRP: ORAL_STRIP | 3 refills | Status: DC

## 2021-11-06 MED ORDER — FENOFIBRATE 160 MG PO TABS: ORAL_TABLET | ORAL | 3 refills | Status: DC

## 2021-11-06 NOTE — Progress Notes (Signed)
Subjective: CC:DM PCP: [name], DO VXY:IAXKP Nichole Cunningham is a 71 y.o. female presenting to clinic today for:  1. Type 2 Diabetes with hypertension, hyperlipidemia and CKD3a:  She has diet-controlled diabetes.  She is compliant with her cholesterol medications but needs refills.  She admits that her sugar will likely be elevated today because she has not been abiding by strict diet.  Last eye exam: Had done.  ROI completed Last foot exam: needs Last A1c:  Lab Results  Component Value Date   HGBA1C 6.4 (H) 02/10/2021   Nephropathy screen indicated?: needs Last flu, zoster and/or pneumovax:  Immunization History  Administered Date(s) Administered   Fluad Quad(high Dose 65+) 02/10/2021   Moderna SARS-COV2 Booster Vaccination 05/20/2020   Moderna Sars-Covid-2 Vaccination 04/24/2019, 05/27/2019   Pneumococcal Conjugate-13 01/07/2017   Pneumococcal Polysaccharide-23 04/07/2018   Td 05/30/1999    ROS: No chest pain, shortness of breath, dizziness, falls.  No foot ulcerations or sensory changes but does note some pain with getting up from a seated position in the bottoms of both heels, right greater than left.  No preceding injury.   ROS: Per HPI  No Known Allergies Past Medical History:  Diagnosis Date   Complication of anesthesia    Hyperlipidemia    PONV (postoperative nausea and vomiting)     Current Outpatient Medications:    Cholecalciferol 50 MCG (2000 UT) TABS, Take 1 tablet by mouth daily., Disp: , Rfl:    Doxepin HCl 3 MG TABS, Take 1 tablet (3 mg total) by mouth at bedtime as needed (sleep). (Patient not taking: Reported on 10/10/2021), Disp: 30 tablet, Rfl: 1   fenofibrate 160 MG tablet, TAKE 1 TABLET (160 MG TOTAL) DAILY BY MOUTH., Disp: 90 tablet, Rfl: 3   glucose blood (ONETOUCH VERIO) test strip, TEST BLOOD GLUCOSE DAILY AS NEEDED E11.9 (ONE TOUCH VERIO REFLECT), Disp: 100 strip, Rfl: 3   Lancet Device MISC, Test BGs once daily as needed E11.9 (one  touch verio reflect), Disp: 100 each, Rfl: 12   Lancets (ONETOUCH DELICA PLUS [id number]) MISC, Apply topically daily as needed., Disp: , Rfl:    Omega-3 Fatty Acids (OMEGA 3 PO), Take by mouth., Disp: , Rfl:    rosuvastatin (CRESTOR) 10 MG tablet, TAKE 1 TABLET (10 MG TOTAL) BY MOUTH DAILY. (NEEDS TO BE SEEN BEFORE NEXT REFILL), Disp: 30 tablet, Rfl: 0   tiZANidine (ZANAFLEX) 4 MG tablet, Take 0.5-1 tablets (2-4 mg total) by mouth every 8 (eight) hours as needed for muscle spasms. (Patient not taking: Reported on 10/10/2021), Disp: 30 tablet, Rfl: 1 Social History   Socioeconomic History   Marital status: Married    Spouse name: Charles   Number of children: 2   Years of education: 12   Highest education level: High school graduate  Occupational History   Occupation: retired  Tobacco Use   Smoking status: Never   Smokeless tobacco: Never  Vaping Use   Vaping Use: Never used  Substance and Sexual Activity   Alcohol use: No   Drug use: No   Sexual activity: Yes    Birth control/protection: Post-menopausal  Other Topics Concern   Not on file  Social History Narrative   Not on file   Social Determinants of Health   Financial Resource Strain: Low Risk  (10/10/2021)   Overall Financial Resource Strain (CARDIA)    Difficulty of Paying Living Expenses: Not hard at all  Food Insecurity: No Food Insecurity (10/10/2021)   Hunger Vital Sign  Worried About Charity fundraiser in the Last Year: Never true    [place] in the Last Year: Never true  Transportation Needs: No Transportation Needs (10/10/2021)   PRAPARE - Hydrologist (Medical): No    Lack of Transportation (Non-Medical): No  Physical Activity: Inactive (10/10/2021)   Exercise Vital Sign    Days of Exercise per Week: 0 days    Minutes of Exercise per Session: 0 min  Stress: No Stress Concern Present (10/10/2021)   [place]     Feeling of Stress : Only a little  Social Connections: Socially Integrated (10/10/2021)   Social Connection and Isolation Panel [NHANES]    Frequency of Communication with Friends and Family: More than three times a week    Frequency of Social Gatherings with Friends and Family: More than three times a week    Attends Religious Services: More than 4 times per year    Active Member of [employer] or Organizations: Yes    Attends Music therapist: More than 4 times per year    Marital Status: Married  Human resources officer Violence: Not At Risk (10/10/2021)   Humiliation, Afraid, Rape, and Kick questionnaire    Fear of Current or Ex-Partner: No    Emotionally Abused: No    Physically Abused: No    Sexually Abused: No   Family History  Problem Relation Age of Onset   Breast cancer Maternal Aunt    Cancer Mother    Heart disease Father    Heart attack Father        cause of death   Cancer Sister    Colon cancer Sister        dx 2020   Cancer Brother        metastatic w/ uncertain primary dx 1443   Anesthesia problems Neg Hx    Hypotension Neg Hx    Malignant hyperthermia Neg Hx    Pseudochol deficiency Neg Hx    Stomach cancer Neg Hx     Objective: Office vital signs reviewed. BP (!) 151/88   Pulse 81   Temp (!) 96.8 F (36 C) (Temporal)   Ht _0  (1.575 m)   Wt 167 lb 12.8 oz (76.1 kg)   SpO2 93%   BMI 30.69 kg/m   Physical Examination:  General: Awake, alert, well nourished, No acute distress HEENT: Sclera white.  Moist mucous membranes Cardio: regular rate and rhythm, S1S2 heard, no murmurs appreciated Pulm: clear to auscultation bilaterally, no wheezes, rhonchi or rales; normal work of breathing on room air MSK: Ambulating independently with normal gait and station Neuro: See diabetic foot exam  Diabetic Foot Exam - Simple   Simple Foot Form Diabetic Foot exam was performed with the following findings: Yes 11/06/2021  8:30 AM  Visual Inspection No  deformities, no ulcerations, no other skin breakdown bilaterally: Yes Sensation Testing Intact to touch and monofilament testing bilaterally: Yes Pulse Check Posterior Tibialis and Dorsalis pulse intact bilaterally: Yes Comments      Assessment/ Plan: 71 y.o. female   Diet-controlled diabetes mellitus ([place]) - Plan: CMP14+EGFR, Bayer DCA Hb A1c Waived, Microalbumin / creatinine urine ratio, glucose blood (ONETOUCH VERIO) test strip, Lancet Device MISC  Chronic kidney disease, stage 3a ([place]) - Plan: CMP14+EGFR, CBC, VITAMIN D 25 Hydroxy (Vit-D Deficiency, Fractures), Microalbumin / creatinine urine ratio  Hyperlipidemia associated with type 2 diabetes mellitus ([place]) - Plan: CMP14+EGFR, Lipid  Panel, TSH, fenofibrate 160 MG tablet, rosuvastatin (CRESTOR) 10 MG tablet  Plantar fasciitis of right foot  Neck pain - Plan: tiZANidine (ZANAFLEX) 4 MG tablet  Testing supplies have been ordered.  Check fasting metabolic panel, cholesterol.  Check A1c. For exam performed and ROI for eye exam completed  Check renal function, urine microalbumin.  Not currently treated with ACE or ARB.  May need to add given elevations in blood pressure.  Check fasting lipid.  Meds have been renewed  Home care instructions were given for at home physical therapy and stretches.  If ongoing planter fasciitis, plan for referral to podiatry for injection therapy  Neck pain is not flared up currently but needs a new supply of the tizanidine as this is expired.   No orders of the defined types were placed in this encounter.  No orders of the defined types were placed in this encounter.    [name], DO [place] [phone]

## 2021-11-07 LAB — CBC
Hematocrit: 43.7 % (ref 34.0–46.6)
Hemoglobin: 15 g/dL (ref 11.1–15.9)
MCH: 31.6 pg (ref 26.6–33.0)
MCHC: 34.3 g/dL (ref 31.5–35.7)
MCV: 92 fL (ref 79–97)
Platelets: 218 10*3/uL (ref 150–450)
RBC: 4.75 x10E6/uL (ref 3.77–5.28)
RDW: 11.8 % (ref 11.7–15.4)
WBC: 6.6 10*3/uL (ref 3.4–10.8)

## 2021-11-07 LAB — CMP14+EGFR
ALT: 36 IU/L — ABNORMAL HIGH (ref 0–32)
AST: 35 IU/L (ref 0–40)
Albumin/Globulin Ratio: 2.3 — ABNORMAL HIGH (ref 1.2–2.2)
Albumin: 4.6 g/dL (ref 3.9–4.9)
Alkaline Phosphatase: 88 IU/L (ref 44–121)
BUN/Creatinine Ratio: 16 (ref 12–28)
BUN: 15 mg/dL (ref 8–27)
Bilirubin Total: 0.7 mg/dL (ref 0.0–1.2)
CO2: 23 mmol/L (ref 20–29)
Calcium: 9.8 mg/dL (ref 8.7–10.3)
Chloride: 104 mmol/L (ref 96–106)
Creatinine, Ser: 0.93 mg/dL (ref 0.57–1.00)
Globulin, Total: 2 g/dL (ref 1.5–4.5)
Glucose: 136 mg/dL — ABNORMAL HIGH (ref 70–99)
Potassium: 4.5 mmol/L (ref 3.5–5.2)
Sodium: 142 mmol/L (ref 134–144)
Total Protein: 6.6 g/dL (ref 6.0–8.5)
eGFR: 66 mL/min/{1.73_m2} (ref >59)

## 2021-11-07 LAB — LIPID PANEL
Chol/HDL Ratio: 3.3 ratio (ref 0.0–4.4)
Cholesterol, Total: 119 mg/dL (ref 100–199)
HDL: 36 mg/dL — ABNORMAL LOW (ref >39)
LDL Chol Calc (NIH): 59 mg/dL (ref 0–99)
Triglycerides: 133 mg/dL (ref 0–149)
VLDL Cholesterol Cal: 24 mg/dL (ref 5–40)

## 2021-11-07 LAB — MICROALBUMIN / CREATININE URINE RATIO
Creatinine, Urine: 107.5 mg/dL
Microalb/Creat Ratio: 3 mg/g creat (ref 0–29)
Microalbumin, Urine: 3.5 ug/mL

## 2021-11-07 LAB — VITAMIN D 25 HYDROXY (VIT D DEFICIENCY, FRACTURES): Vit D, 25-Hydroxy: 36.1 ng/mL (ref 30.0–100.0)

## 2021-11-07 LAB — TSH: TSH: 2.79 u[IU]/mL (ref 0.450–4.500)

## 2022-01-30 ENCOUNTER — Encounter: Payer: Self-pay | Admitting: Family

## 2022-01-30 ENCOUNTER — Ambulatory Visit (INDEPENDENT_AMBULATORY_CARE_PROVIDER_SITE_OTHER): Payer: Medicare HMO | Admitting: Family

## 2022-01-30 VITALS — BP 134/89 | HR 100 | Temp 98.0°F | Ht 62.0 in | Wt 171.6 lb

## 2022-01-30 DIAGNOSIS — J208 Acute bronchitis due to other specified organisms: Secondary | ICD-10-CM

## 2022-01-30 DIAGNOSIS — B9689 Other specified bacterial agents as the cause of diseases classified elsewhere: Secondary | ICD-10-CM

## 2022-01-30 MED ORDER — PROMETHAZINE-DM 6.25-15 MG/5ML PO SYRP: 5.0000 mL | ORAL_SOLUTION | Freq: Three times a day (TID) | ORAL | 0 refills | Status: DC | PRN

## 2022-01-30 MED ORDER — DOXYCYCLINE HYCLATE 100 MG PO TABS: 100.0000 mg | ORAL_TABLET | Freq: Two times a day (BID) | ORAL | 0 refills | Status: DC

## 2022-01-30 MED ORDER — PROMETHAZINE-DM 6.25-15 MG/5ML PO SYRP: 5.0000 mL | ORAL_SOLUTION | Freq: Three times a day (TID) | ORAL | 1 refills | Status: DC | PRN

## 2022-01-30 MED ORDER — PREDNISONE 10 MG (21) PO TBPK: ORAL_TABLET | ORAL | 0 refills | Status: DC

## 2022-01-30 MED ORDER — BENZONATATE 200 MG PO CAPS: 200.0000 mg | ORAL_CAPSULE | Freq: Three times a day (TID) | ORAL | 1 refills | Status: DC | PRN

## 2022-01-30 NOTE — Patient Instructions (Signed)

## 2022-01-30 NOTE — Progress Notes (Signed)
Subjective:    Patient ID: Nichole Cunningham, female    DOB: March 21, 1950, 71 y.o.   MRN: [id number]  Chief Complaint  Patient presents with   Cough    No other symptoms off and on for weeks     Cough This is a new problem. The current episode started 1 to 4 weeks ago. The problem has been waxing and waning. The problem occurs every few minutes. The cough is Productive of sputum. Associated symptoms include headaches and nasal congestion. Pertinent negatives include no chills, ear congestion, ear pain, fever, myalgias or shortness of breath. The treatment provided mild relief.      Review of Systems  Constitutional:  Negative for chills and fever.  HENT:  Negative for ear pain.   Respiratory:  Positive for cough. Negative for shortness of breath.   Musculoskeletal:  Negative for myalgias.  Neurological:  Positive for headaches.  All other systems reviewed and are negative.      Objective:   Physical Exam Vitals reviewed.  Constitutional:      General: She is not in acute distress.    Appearance: She is well-developed.  HENT:     Head: Normocephalic and atraumatic.     Right Ear: Tympanic membrane normal.     Left Ear: Tympanic membrane normal.  Eyes:     Pupils: Pupils are equal, round, and reactive to light.  Neck:     Thyroid: No thyromegaly.  Cardiovascular:     Rate and Rhythm: Normal rate and regular rhythm.     Heart sounds: Normal heart sounds. No murmur heard. Pulmonary:     Effort: Pulmonary effort is normal. No respiratory distress.     Breath sounds: Decreased breath sounds present. No wheezing.     Comments: Dry nonproductive cough Abdominal:     General: Bowel sounds are normal. There is no distension.     Palpations: Abdomen is soft.     Tenderness: There is no abdominal tenderness.  Musculoskeletal:        General: No tenderness. Normal range of motion.     Cervical back: Normal range of motion and neck supple.  Skin:    General: Skin is warm and  dry.  Neurological:     Mental Status: She is alert and oriented to person, place, and time.     Cranial Nerves: No cranial nerve deficit.     Deep Tendon Reflexes: Reflexes are normal and symmetric.  Psychiatric:        Behavior: Behavior normal.        Thought Content: Thought content normal.        Judgment: Judgment normal.      BP 134/89   Pulse 100   Temp 98 F (36.7 C) (Temporal)   Ht '5\' 2"'$  (1.575 m)   Wt 171 lb 9.6 oz (77.8 kg)   SpO2 93%   BMI 31.39 kg/m       Assessment & Plan:  Nichole Cunningham comes in today with chief complaint of Cough (No other symptoms off and on for weeks )   Diagnosis and orders addressed:  1. Acute bacterial bronchitis - Take meds as prescribed - Use a cool mist humidifier  -Use saline nose sprays frequently -Force fluids -For any cough or congestion  Use plain Mucinex- regular strength or max strength is fine -For fever or aces or pains- take tylenol or ibuprofen. -Throat lozenges if help -Follow up if symptoms worsen or do not improve  -  doxycycline (VIBRA-TABS) 100 MG tablet; Take 1 tablet (100 mg total) by mouth 2 (two) times daily.  Dispense: 20 tablet; Refill: 0 - predniSONE (STERAPRED UNI-PAK 21 TAB) 10 MG (21) TBPK tablet; Use as directed  Dispense: 21 tablet; Refill: 0 - benzonatate (TESSALON) 200 MG capsule; Take 1 capsule (200 mg total) by mouth 3 (three) times daily as needed.  Dispense: 30 capsule; Refill: 1 - promethazine-dextromethorphan (PROMETHAZINE-DM) 6.25-15 MG/5ML syrup; Take 5 mLs by mouth 3 (three) times daily as needed for cough.  Dispense: 240 mL; Refill: [place], FNP

## 2022-03-06 ENCOUNTER — Ambulatory Visit: Payer: Medicare HMO | Admitting: Family Medicine

## 2022-04-26 ENCOUNTER — Encounter: Payer: Self-pay | Admitting: Radiology

## 2022-05-07 ENCOUNTER — Ambulatory Visit: Payer: Medicare HMO | Admitting: Family Medicine

## 2022-05-15 ENCOUNTER — Encounter: Payer: Self-pay | Admitting: Family Medicine

## 2022-05-15 ENCOUNTER — Ambulatory Visit (INDEPENDENT_AMBULATORY_CARE_PROVIDER_SITE_OTHER): Payer: Medicare HMO | Admitting: Family Medicine

## 2022-05-15 VITALS — BP 134/79 | HR 85 | Temp 98.4°F | Ht 62.0 in | Wt 168.8 lb

## 2022-05-15 DIAGNOSIS — J029 Acute pharyngitis, unspecified: Secondary | ICD-10-CM | POA: Diagnosis not present

## 2022-05-15 DIAGNOSIS — E1169 Type 2 diabetes mellitus with other specified complication: Secondary | ICD-10-CM | POA: Diagnosis not present

## 2022-05-15 DIAGNOSIS — J302 Other seasonal allergic rhinitis: Secondary | ICD-10-CM | POA: Diagnosis not present

## 2022-05-15 DIAGNOSIS — E785 Hyperlipidemia, unspecified: Secondary | ICD-10-CM

## 2022-05-15 DIAGNOSIS — N182 Chronic kidney disease, stage 2 (mild): Secondary | ICD-10-CM

## 2022-05-15 DIAGNOSIS — E1165 Type 2 diabetes mellitus with hyperglycemia: Secondary | ICD-10-CM | POA: Diagnosis not present

## 2022-05-15 DIAGNOSIS — E119 Type 2 diabetes mellitus without complications: Secondary | ICD-10-CM

## 2022-05-15 LAB — CULTURE, GROUP A STREP

## 2022-05-15 LAB — BAYER DCA HB A1C WAIVED: HB A1C (BAYER DCA - WAIVED): 8.3 % — ABNORMAL HIGH (ref 4.8–5.6)

## 2022-05-15 LAB — RAPID STREP SCREEN (MED CTR MEBANE ONLY): Strep Gp A Ag, IA W/Reflex: NEGATIVE

## 2022-05-15 MED ORDER — LANCET DEVICE MISC: 12 refills | Status: AC

## 2022-05-15 MED ORDER — EMPAGLIFLOZIN 10 MG PO TABS: 10.0000 mg | ORAL_TABLET | Freq: Every day | ORAL | 0 refills | Status: DC

## 2022-05-15 MED ORDER — LEVOCETIRIZINE DIHYDROCHLORIDE 5 MG PO TABS: 5.0000 mg | ORAL_TABLET | Freq: Every evening | ORAL | 3 refills | Status: DC

## 2022-05-15 MED ORDER — ONETOUCH VERIO VI STRP: ORAL_STRIP | 3 refills | Status: DC

## 2022-05-15 MED ORDER — BENZONATATE 200 MG PO CAPS: 200.0000 mg | ORAL_CAPSULE | Freq: Three times a day (TID) | ORAL | 1 refills | Status: DC | PRN

## 2022-05-15 NOTE — Patient Instructions (Signed)

## 2022-05-15 NOTE — Progress Notes (Signed)
Subjective: CC:DM PCP: [name], DO [id number] Nichole Cunningham is a 72 y.o. female presenting to clinic today for:  1. Type 2 Diabetes with hypertension, hyperlipidemia:  Glucometer:***.   High at home: ***; Low at home: ***, Taking medication(s): ***,.  Last eye exam: needs Last foot exam: UTD Last A1c:  Lab Results  Component Value Date   HGBA1C 7.0 (H) 11/06/2021   Nephropathy screen indicated?: UTD Last flu, zoster and/or pneumovax:  Immunization History  Administered Date(s) Administered   Fluad Quad(high Dose 65+) 02/10/2021   Moderna SARS-COV2 Booster Vaccination 05/20/2020   Moderna Sars-Covid-2 Vaccination 04/24/2019, 05/27/2019   Pneumococcal Conjugate-13 01/07/2017   Pneumococcal Polysaccharide-23 04/07/2018   Td 05/30/1999   Zoster Recombinat (Shingrix) 11/06/2021    ROS: ***dizziness, LOC, polyuria, polydipsia, unintended weight loss/gain, foot ulcerations, numbness or tingling in extremities, shortness of breath or chest pain.    ROS: Per HPI  No Known Allergies Past Medical History:  Diagnosis Date   Complication of anesthesia    Hyperlipidemia    PONV (postoperative nausea and vomiting)     Current Outpatient Medications:    Cholecalciferol 50 MCG (2000 UT) TABS, Take 1 tablet by mouth daily., Disp: , Rfl:    fenofibrate 160 MG tablet, TAKE 1 TABLET (160 MG TOTAL) DAILY BY MOUTH., Disp: 90 tablet, Rfl: 3   glucose blood (ONETOUCH VERIO) test strip, TEST BLOOD GLUCOSE DAILY AS NEEDED E11.9 (ONE TOUCH VERIO REFLECT), Disp: 100 strip, Rfl: 3   Lancet Device MISC, Test BGs once daily as needed E11.9 (one touch verio reflect), Disp: 100 each, Rfl: 12   Omega-3 Fatty Acids (OMEGA 3 PO), Take by mouth., Disp: , Rfl:    rosuvastatin (CRESTOR) 10 MG tablet, Take 1 tablet (10 mg total) by mouth daily., Disp: 90 tablet, Rfl: 3   tiZANidine (ZANAFLEX) 4 MG tablet, Take 0.5-1 tablets (2-4 mg total) by mouth every 8 (eight) hours as needed for muscle  spasms., Disp: 30 tablet, Rfl: 1 Social History   Socioeconomic History   Marital status: Married    Spouse name: Charles   Number of children: 2   Years of education: 12   Highest education level: High school graduate  Occupational History   Occupation: retired  Tobacco Use   Smoking status: Never   Smokeless tobacco: Never  Vaping Use   Vaping Use: Never used  Substance and Sexual Activity   Alcohol use: No   Drug use: No   Sexual activity: Yes    Birth control/protection: Post-menopausal  Other Topics Concern   Not on file  Social History Narrative   Not on file   Social Determinants of Health   Financial Resource Strain: Low Risk  (10/10/2021)   Overall Financial Resource Strain (CARDIA)    Difficulty of Paying Living Expenses: Not hard at all  Food Insecurity: No Food Insecurity (10/10/2021)   Hunger Vital Sign    Worried About Running Out of Food in the Last Year: Never true    [employer] in the Last Year: Never true  Transportation Needs: No Transportation Needs (10/10/2021)   PRAPARE - Hydrologist (Medical): No    Lack of Transportation (Non-Medical): No  Physical Activity: Inactive (10/10/2021)   Exercise Vital Sign    Days of Exercise per Week: 0 days    Minutes of Exercise per Session: 0 min  Stress: No Stress Concern Present (10/10/2021)   [place]  Questionnaire    Feeling of Stress : Only a little  Social Connections: Socially Integrated (10/10/2021)   Social Connection and Isolation Panel [NHANES]    Frequency of Communication with Friends and Family: More than three times a week    Frequency of Social Gatherings with Friends and Family: More than three times a week    Attends Religious Services: More than 4 times per year    Active Member of [employer] or Organizations: Yes    Attends Music therapist: More than 4 times per year    Marital Status: Married   Human resources officer Violence: Not At Risk (10/10/2021)   Humiliation, Afraid, Rape, and Kick questionnaire    Fear of Current or Ex-Partner: No    Emotionally Abused: No    Physically Abused: No    Sexually Abused: No   Family History  Problem Relation Age of Onset   Breast cancer Maternal Aunt    Cancer Mother    Heart disease Father    Heart attack Father        cause of death   Cancer Sister    Colon cancer Sister        dx 2020   Cancer Brother        metastatic w/ uncertain primary dx [id number]   Anesthesia problems Neg Hx    Hypotension Neg Hx    Malignant hyperthermia Neg Hx    Pseudochol deficiency Neg Hx    Stomach cancer Neg Hx     Objective: Office vital signs reviewed. BP (!) 142/90   Pulse 85   Temp 98.4 F (36.9 C)   Ht 5\' 2"  (1.575 m)   Wt 168 lb 12.8 oz (76.6 kg)   SpO2 90%   BMI 30.87 kg/m   Physical Examination:  General: Awake, alert, *** nourished, No acute distress HEENT: Normal    Neck: No masses palpated. No lymphadenopathy    Ears: Tympanic membranes intact, normal light reflex, no erythema, no bulging    Eyes: PERRLA, extraocular membranes intact, sclera ***    Nose: nasal turbinates moist, *** nasal discharge    Throat: moist mucus membranes, no erythema, *** tonsillar exudate.  Airway is patent Cardio: regular rate and rhythm, S1S2 heard, no murmurs appreciated Pulm: clear to auscultation bilaterally, no wheezes, rhonchi or rales; normal work of breathing on room air GI: soft, non-tender, non-distended, bowel sounds present x4, no hepatomegaly, no splenomegaly, no masses GU: external vaginal tissue ***, cervix ***, *** punctate lesions on cervix appreciated, *** discharge from cervical os, *** bleeding, *** cervical motion tenderness, *** abdominal/ adnexal masses Extremities: warm, well perfused, No edema, cyanosis or clubbing; +*** pulses bilaterally MSK: *** gait and *** station Skin: dry; intact; no rashes or lesions Neuro: *** Strength and  light touch sensation grossly intact, *** DTRs ***/4  Assessment/ Plan: 72 y.o. female   ***  Orders Placed This Encounter  Procedures   Bayer DCA Hb A1c Waived   Rapid Strep A   No orders of the defined types were placed in this encounter.    [name], DO [place] [phone]

## 2022-05-21 ENCOUNTER — Telehealth: Payer: Self-pay | Admitting: Family Medicine

## 2022-05-21 DIAGNOSIS — E1165 Type 2 diabetes mellitus with hyperglycemia: Secondary | ICD-10-CM

## 2022-05-21 DIAGNOSIS — E1169 Type 2 diabetes mellitus with other specified complication: Secondary | ICD-10-CM

## 2022-05-21 MED ORDER — OZEMPIC (0.25 OR 0.5 MG/DOSE) 2 MG/3ML ~~LOC~~ SOPN: 0.2500 mg | PEN_INJECTOR | SUBCUTANEOUS | 0 refills | Status: DC

## 2022-05-21 NOTE — Telephone Encounter (Signed)
See below

## 2022-05-21 NOTE — Telephone Encounter (Signed)
Shot sent.  Id like her to come back in 1 month for interval check up and titration of med.  Please offer her a triage nurse visit to demonstrate how to inject pen appropriately Please also reiterate the below:  Tips for success with Ozempic (and by success, how not to be super sick on your stomach): Eat small meals AVOID heavy foods (fried/ high in carbs like bread, pasta, rice) AVOID carbonated beverages (soda/ beer, as these can increase bloating) DOUBLE your water intake (will help you avoid constipation/ dehydration)  Ozempic CAN cause: Nausea Abdominal pain Increased acid reflux (sometimes presents as "sour burps") Constipation OR Diarrhea Fatigue (especially when you first start it)

## 2022-06-11 ENCOUNTER — Ambulatory Visit (INDEPENDENT_AMBULATORY_CARE_PROVIDER_SITE_OTHER): Payer: Medicare HMO | Admitting: Family Medicine

## 2022-06-11 ENCOUNTER — Encounter: Payer: Self-pay | Admitting: Family Medicine

## 2022-06-11 VITALS — BP 134/87 | HR 93 | Temp 98.6°F | Ht 62.0 in | Wt 165.0 lb

## 2022-06-11 DIAGNOSIS — E1169 Type 2 diabetes mellitus with other specified complication: Secondary | ICD-10-CM | POA: Diagnosis not present

## 2022-06-11 DIAGNOSIS — E1165 Type 2 diabetes mellitus with hyperglycemia: Secondary | ICD-10-CM

## 2022-06-11 DIAGNOSIS — E785 Hyperlipidemia, unspecified: Secondary | ICD-10-CM | POA: Diagnosis not present

## 2022-06-11 DIAGNOSIS — Z1211 Encounter for screening for malignant neoplasm of colon: Secondary | ICD-10-CM

## 2022-06-11 MED ORDER — ROSUVASTATIN CALCIUM 10 MG PO TABS: 10.0000 mg | ORAL_TABLET | Freq: Every day | ORAL | 3 refills | Status: DC

## 2022-06-11 MED ORDER — OZEMPIC (0.25 OR 0.5 MG/DOSE) 2 MG/3ML ~~LOC~~ SOPN: 0.5000 mg | PEN_INJECTOR | SUBCUTANEOUS | 3 refills | Status: AC

## 2022-06-11 MED ORDER — FENOFIBRATE 160 MG PO TABS: ORAL_TABLET | ORAL | 3 refills | Status: DC

## 2022-06-11 NOTE — Progress Notes (Signed)
Subjective: CC:DM, new med PCP: Raliegh Ip, DO WUJ:[id number] Nichole Cunningham is a 72 y.o. female presenting to clinic today for:  1. DM Patient newly started on Ozempic.  She has been injecting 0.25 mg every 7 days and notes that she is tolerating this without difficulty.  On average blood sugars are less than 130.  She has had no hypoglycemic episodes.  Denies any nausea, vomiting or abdominal pain.  Perhaps some slight constipation but nothing that severe or causing any rectal bleeding.  She is due for colonoscopy and needs referral back to [facility] gastroenterology.   ROS: Per HPI  No Known Allergies Past Medical History:  Diagnosis Date   Complication of anesthesia    Hyperlipidemia    PONV (postoperative nausea and vomiting)     Current Outpatient Medications:    benzonatate (TESSALON) 200 MG capsule, Take 1 capsule (200 mg total) by mouth 3 (three) times daily as needed., Disp: 30 capsule, Rfl: 1   Cholecalciferol 50 MCG (2000 UT) TABS, Take 1 tablet by mouth daily., Disp: , Rfl:    fenofibrate 160 MG tablet, TAKE 1 TABLET (160 MG TOTAL) DAILY BY MOUTH., Disp: 90 tablet, Rfl: 3   glucose blood (ONETOUCH VERIO) test strip, TEST BLOOD GLUCOSE DAILY AS NEEDED E11.9 (ONE TOUCH VERIO REFLECT), Disp: 100 strip, Rfl: 3   Lancet Device MISC, Test BGs once daily as needed E11.9 (one touch verio reflect), Disp: 100 each, Rfl: 12   levocetirizine (XYZAL) 5 MG tablet, Take 1 tablet (5 mg total) by mouth every evening. For allergies/ drainage, Disp: 90 tablet, Rfl: 3   Omega-3 Fatty Acids (OMEGA 3 PO), Take by mouth., Disp: , Rfl:    rosuvastatin (CRESTOR) 10 MG tablet, Take 1 tablet (10 mg total) by mouth daily., Disp: 90 tablet, Rfl: 3   Semaglutide,0.25 or 0.5MG /DOS, (OZEMPIC, 0.25 OR 0.5 MG/DOSE,) 2 MG/3ML SOPN, Inject 0.25 mg into the skin every 7 (seven) days., Disp: 3 mL, Rfl: 0   tiZANidine (ZANAFLEX) 4 MG tablet, Take 0.5-1 tablets (2-4 mg total) by mouth every 8 (eight) hours as  needed for muscle spasms., Disp: 30 tablet, Rfl: 1 Social History   Socioeconomic History   Marital status: Married    Spouse name: Charles   Number of children: 2   Years of education: 12   Highest education level: High school graduate  Occupational History   Occupation: retired  Tobacco Use   Smoking status: Never   Smokeless tobacco: Never  Vaping Use   Vaping Use: Never used  Substance and Sexual Activity   Alcohol use: No   Drug use: No   Sexual activity: Yes    Birth control/protection: Post-menopausal  Other Topics Concern   Not on file  Social History Narrative   Not on file   Social Determinants of Health   Financial Resource Strain: Low Risk  (10/10/2021)   Overall Financial Resource Strain (CARDIA)    Difficulty of Paying Living Expenses: Not hard at all  Food Insecurity: No Food Insecurity (10/10/2021)   Hunger Vital Sign    Worried About Running Out of Food in the Last Year: Never true    Ran Out of Food in the Last Year: Never true  Transportation Needs: No Transportation Needs (10/10/2021)   PRAPARE - Administrator, Civil Service (Medical): No    Lack of Transportation (Non-Medical): No  Physical Activity: Inactive (10/10/2021)   Exercise Vital Sign    Days of Exercise per Week: 0  days    Minutes of Exercise per Session: 0 min  Stress: No Stress Concern Present (10/10/2021)   [employer] of Occupational Health - Occupational Stress Questionnaire    Feeling of Stress : Only a little  Social Connections: Socially Integrated (10/10/2021)   Social Connection and Isolation Panel [NHANES]    Frequency of Communication with Friends and Family: More than three times a week    Frequency of Social Gatherings with Friends and Family: More than three times a week    Attends Religious Services: More than 4 times per year    Active Member of [employer] or Organizations: Yes    Attends Engineer, structural: More than 4 times per year    Marital  Status: Married  Catering manager Violence: Not At Risk (10/10/2021)   Humiliation, Afraid, Rape, and Kick questionnaire    Fear of Current or Ex-Partner: No    Emotionally Abused: No    Physically Abused: No    Sexually Abused: No   Family History  Problem Relation Age of Onset   Breast cancer Maternal Aunt    Cancer Mother    Heart disease Father    Heart attack Father        cause of death   Cancer Sister    Colon cancer Sister        dx 2020   Cancer Brother        metastatic w/ uncertain primary dx 2020   Anesthesia problems Neg Hx    Hypotension Neg Hx    Malignant hyperthermia Neg Hx    Pseudochol deficiency Neg Hx    Stomach cancer Neg Hx     Objective: Office vital signs reviewed. BP 134/87   Pulse 93   Temp 98.6 F (37 C)   Ht 5\' 2"  (1.575 m)   Wt 165 lb (74.8 kg)   SpO2 92%   BMI 30.18 kg/m   Physical Examination:  General: Awake, alert, well nourished, No acute distress HEENT: sclera white, MMM Cardio: regular rate and rhythm  Pulm:  normal work of breathing on room air    Assessment/ Plan: 72 y.o. female   Uncontrolled type 2 diabetes mellitus with hyperglycemia - Plan: Semaglutide,0.25 or 0.5MG /DOS, (OZEMPIC, 0.25 OR 0.5 MG/DOSE,) 2 MG/3ML SOPN, Basic Metabolic Panel  Hyperlipidemia associated with type 2 diabetes mellitus - Plan: fenofibrate 160 MG tablet, rosuvastatin (CRESTOR) 10 MG tablet, Semaglutide,0.25 or 0.5MG /DOS, (OZEMPIC, 0.25 OR 0.5 MG/DOSE,) 2 MG/3ML SOPN  Colon cancer screening - Plan: Ambulatory referral to Gastroenterology  Continue current regimen.  Blood sugars are sounding under better control so we will advance her to 0.5 mg/week and I will follow-up with her in the next 2 months, sooner if concerns arise.  Check renal function given initiation of GLP  Continue statin and fenofibrate.  These have been renewed  Referral to gastroenterology placed for colon cancer screening  No orders of the defined types were placed in this  encounter.  No orders of the defined types were placed in this encounter.    Raliegh Ip, DO [facility] [phone]

## 2022-06-12 ENCOUNTER — Telehealth: Payer: Self-pay | Admitting: Family Medicine

## 2022-06-12 LAB — BASIC METABOLIC PANEL
BUN/Creatinine Ratio: 17 (ref 12–28)
BUN: 18 mg/dL (ref 8–27)
CO2: 21 mmol/L (ref 20–29)
Calcium: 10.1 mg/dL (ref 8.7–10.3)
Chloride: 104 mmol/L (ref 96–106)
Creatinine, Ser: 1.08 mg/dL — ABNORMAL HIGH (ref 0.57–1.00)
Glucose: 111 mg/dL — ABNORMAL HIGH (ref 70–99)
Potassium: 4.7 mmol/L (ref 3.5–5.2)
Sodium: 142 mmol/L (ref 134–144)
eGFR: 55 mL/min/{1.73_m2} — ABNORMAL LOW (ref >59)

## 2022-06-12 NOTE — Telephone Encounter (Signed)
Contacted Nichole Cunningham to schedule their annual wellness visit. Appointment made for 06/20/2022.  Thank you,  Nichole Cunningham,  AMB Clinical Support [facility] Direct Dial ??[phone]

## 2022-06-14 NOTE — Progress Notes (Signed)
R/c

## 2022-06-19 ENCOUNTER — Telehealth: Payer: Self-pay

## 2022-06-19 NOTE — Telephone Encounter (Signed)
Called and spoke to pt , she has been notified of results

## 2022-06-20 ENCOUNTER — Ambulatory Visit (INDEPENDENT_AMBULATORY_CARE_PROVIDER_SITE_OTHER): Payer: Medicare HMO

## 2022-06-20 VITALS — Ht 62.0 in | Wt 165.0 lb

## 2022-06-20 DIAGNOSIS — Z0000 Encounter for general adult medical examination without abnormal findings: Secondary | ICD-10-CM | POA: Diagnosis not present

## 2022-06-20 NOTE — Patient Instructions (Signed)
Nichole Cunningham , Thank you for taking time to come for your Medicare Wellness Visit. I appreciate your ongoing commitment to your health goals. Please review the following plan we discussed and let me know if I can assist you in the future.   These are the goals we discussed:  Goals      Remain active and independent        This is a list of the screening recommended for you and due dates:  Health Maintenance  Topic Date Due   COVID-19 Vaccine (4 - 2023-24 season) 10/27/2021   Colon Cancer Screening  03/14/2022   Eye exam for diabetics  05/03/2022   Mammogram  06/15/2022   Zoster (Shingles) Vaccine (2 of 2) 09/10/2022*   Flu Shot  09/27/2022   Yearly kidney health urinalysis for diabetes  11/07/2022   Complete foot exam   11/07/2022   Hemoglobin A1C  11/15/2022   DEXA scan (bone density measurement)  02/11/2023   Yearly kidney function blood test for diabetes  06/11/2023   Medicare Annual Wellness Visit  06/20/2023   Pneumonia Vaccine  Completed   Hepatitis C Screening: USPSTF Recommendation to screen - Ages 67-79 yo.  Completed   HPV Vaccine  Aged Out   DTaP/Tdap/Td vaccine  Discontinued  *Topic was postponed. The date shown is not the original due date.    Advanced directives: Please bring a copy of your health care power of attorney and living will to the office to be added to your chart at your convenience.  Conditions/risks identified: Aim for 30 minutes of exercise or brisk walking, 6-8 glasses of water, and 5 servings of fruits and vegetables each day.  Next appointment: Follow up in one year for your annual wellness visit   Your MyChart Username is:  [email or web address]    * if you have any continued difficulty in accessing your account you can reach the help desk at [phone]    Preventive Care 65 Years and Older, Female Preventive care refers to lifestyle choices and visits with your health care provider that can promote health and wellness. What does preventive care  include? A yearly physical exam. This is also called an annual well check. Dental exams once or twice a year. Routine eye exams. Ask your health care provider how often you should have your eyes checked. Personal lifestyle choices, including: Daily care of your teeth and gums. Regular physical activity. Eating a healthy diet. Avoiding tobacco and drug use. Limiting alcohol use. Practicing safe sex. Taking low-dose aspirin every day. Taking vitamin and mineral supplements as recommended by your health care provider. What happens during an annual well check? The services and screenings done by your health care provider during your annual well check will depend on your age, overall health, lifestyle risk factors, and family history of disease. Counseling  Your health care provider may ask you questions about your: Alcohol use. Tobacco use. Drug use. Emotional well-being. Home and relationship well-being. Sexual activity. Eating habits. History of falls. Memory and ability to understand (cognition). Work and work Astronomer. Reproductive health. Screening  You may have the following tests or measurements: Height, weight, and BMI. Blood pressure. Lipid and cholesterol levels. These may be checked every 5 years, or more frequently if you are over 89 years old. Skin check. Lung cancer screening. You may have this screening every year starting at age 65 if you have a 30-pack-year history of smoking and currently smoke or have quit within the past 15 years. Fecal  occult blood test (FOBT) of the stool. You may have this test every year starting at age 91. Flexible sigmoidoscopy or colonoscopy. You may have a sigmoidoscopy every 5 years or a colonoscopy every 10 years starting at age 8. Hepatitis C blood test. Hepatitis B blood test. Sexually transmitted disease (STD) testing. Diabetes screening. This is done by checking your blood sugar (glucose) after you have not eaten for a while  (fasting). You may have this done every 1-3 years. Bone density scan. This is done to screen for osteoporosis. You may have this done starting at age 5. Mammogram. This may be done every 1-2 years. Talk to your health care provider about how often you should have regular mammograms. Talk with your health care provider about your test results, treatment options, and if necessary, the need for more tests. Vaccines  Your health care provider may recommend certain vaccines, such as: Influenza vaccine. This is recommended every year. Tetanus, diphtheria, and acellular pertussis (Tdap, Td) vaccine. You may need a Td booster every 10 years. Zoster vaccine. You may need this after age 25. Pneumococcal 13-valent conjugate (PCV13) vaccine. One dose is recommended after age 70. Pneumococcal polysaccharide (PPSV23) vaccine. One dose is recommended after age 32. Talk to your health care provider about which screenings and vaccines you need and how often you need them. This information is not intended to replace advice given to you by your health care provider. Make sure you discuss any questions you have with your health care provider. Document Released: 03/11/2015 Document Revised: 11/02/2015 Document Reviewed: 12/14/2014 Elsevier Interactive Patient Education  2017 [employer].  Fall Prevention in the Home Falls can cause injuries. They can happen to people of all ages. There are many things you can do to make your home safe and to help prevent falls. What can I do on the outside of my home? Regularly fix the edges of walkways and driveways and fix any cracks. Remove anything that might make you trip as you walk through a door, such as a raised step or threshold. Trim any bushes or trees on the path to your home. Use bright outdoor lighting. Clear any walking paths of anything that might make someone trip, such as rocks or tools. Regularly check to see if handrails are loose or broken. Make sure that  both sides of any steps have handrails. Any raised decks and porches should have guardrails on the edges. Have any leaves, snow, or ice cleared regularly. Use sand or salt on walking paths during winter. Clean up any spills in your garage right away. This includes oil or grease spills. What can I do in the bathroom? Use night lights. Install grab bars by the toilet and in the tub and shower. Do not use towel bars as grab bars. Use non-skid mats or decals in the tub or shower. If you need to sit down in the shower, use a plastic, non-slip stool. Keep the floor dry. Clean up any water that spills on the floor as soon as it happens. Remove soap buildup in the tub or shower regularly. Attach bath mats securely with double-sided non-slip rug tape. Do not have throw rugs and other things on the floor that can make you trip. What can I do in the bedroom? Use night lights. Make sure that you have a light by your bed that is easy to reach. Do not use any sheets or blankets that are too big for your bed. They should not hang down onto the  floor. Have a firm chair that has side arms. You can use this for support while you get dressed. Do not have throw rugs and other things on the floor that can make you trip. What can I do in the kitchen? Clean up any spills right away. Avoid walking on wet floors. Keep items that you use a lot in easy-to-reach places. If you need to reach something above you, use a strong step stool that has a grab bar. Keep electrical cords out of the way. Do not use floor polish or wax that makes floors slippery. If you must use wax, use non-skid floor wax. Do not have throw rugs and other things on the floor that can make you trip. What can I do with my stairs? Do not leave any items on the stairs. Make sure that there are handrails on both sides of the stairs and use them. Fix handrails that are broken or loose. Make sure that handrails are as long as the stairways. Check  any carpeting to make sure that it is firmly attached to the stairs. Fix any carpet that is loose or worn. Avoid having throw rugs at the top or bottom of the stairs. If you do have throw rugs, attach them to the floor with carpet tape. Make sure that you have a light switch at the top of the stairs and the bottom of the stairs. If you do not have them, ask someone to add them for you. What else can I do to help prevent falls? Wear shoes that: Do not have high heels. Have rubber bottoms. Are comfortable and fit you well. Are closed at the toe. Do not wear sandals. If you use a stepladder: Make sure that it is fully opened. Do not climb a closed stepladder. Make sure that both sides of the stepladder are locked into place. Ask someone to hold it for you, if possible. Clearly mark and make sure that you can see: Any grab bars or handrails. First and last steps. Where the edge of each step is. Use tools that help you move around (mobility aids) if they are needed. These include: Canes. Walkers. Scooters. Crutches. Turn on the lights when you go into a dark area. Replace any light bulbs as soon as they burn out. Set up your furniture so you have a clear path. Avoid moving your furniture around. If any of your floors are uneven, fix them. If there are any pets around you, be aware of where they are. Review your medicines with your doctor. Some medicines can make you feel dizzy. This can increase your chance of falling. Ask your doctor what other things that you can do to help prevent falls. This information is not intended to replace advice given to you by your health care provider. Make sure you discuss any questions you have with your health care provider. Document Released: 12/09/2008 Document Revised: 07/21/2015 Document Reviewed: 03/19/2014 Elsevier Interactive Patient Education  2017 [employer].

## 2022-06-20 NOTE — Progress Notes (Signed)
Subjective:   Nichole Cunningham is a 72 y.o. female who presents for Medicare Annual (Subsequent) preventive examination.  I connected with  Sheryle Hail on 06/20/22 by a audio enabled telemedicine application and verified that I am speaking with the correct person using two identifiers.  Patient Location: Home  Provider Location: Home Office  I discussed the limitations of evaluation and management by telemedicine. The patient expressed understanding and agreed to proceed.  Review of Systems     Cardiac Risk Factors include: advanced age (>35men, >20 women);diabetes mellitus;dyslipidemia;hypertension;sedentary lifestyle     Objective:    Today's Vitals   06/20/22 1453  Weight: 165 lb (74.8 kg)  Height: 5\' 2"  (1.575 m)   Body mass index is 30.18 kg/m.     06/20/2022    2:57 PM 10/10/2021    8:29 AM 03/09/2021    3:28 PM 10/07/2020    8:26 AM 10/07/2019    8:26 AM 09/30/2018    8:39 AM 04/11/2011    1:48 PM  Advanced Directives  Does Patient Have a Medical Advance Directive? Yes Yes No Yes Yes No Patient does not have advance directive;Patient would not like information  Type of Advance Directive Living will;Healthcare Power of [employer] Power of [place];Living will  Healthcare Power of [place];Living will Healthcare Power of Attorney    Does patient want to make changes to medical advance directive? No - Patient declined    No - Patient declined    Copy of Healthcare Power of Attorney in Chart? No - copy requested No - copy requested  No - copy requested No - copy requested    Would patient like information on creating a medical advance directive?     No - Patient declined No - Patient declined   Pre-existing out of facility DNR order (yellow form or pink MOST form)       No    Current Medications (verified) Outpatient Encounter Medications as of 06/20/2022  Medication Sig   Cholecalciferol 50 MCG (2000 UT) TABS Take 1 tablet by mouth daily.   fenofibrate 160 MG  tablet TAKE 1 TABLET (160 MG TOTAL) DAILY BY MOUTH.   glucose blood (ONETOUCH VERIO) test strip TEST BLOOD GLUCOSE DAILY AS NEEDED E11.9 (ONE TOUCH VERIO REFLECT)   Lancet Device MISC Test BGs once daily as needed E11.9 (one touch verio reflect)   levocetirizine (XYZAL) 5 MG tablet Take 1 tablet (5 mg total) by mouth every evening. For allergies/ drainage   Omega-3 Fatty Acids (OMEGA 3 PO) Take by mouth.   rosuvastatin (CRESTOR) 10 MG tablet Take 1 tablet (10 mg total) by mouth daily.   Semaglutide,0.25 or 0.5MG /DOS, (OZEMPIC, 0.25 OR 0.5 MG/DOSE,) 2 MG/3ML SOPN Inject 0.5 mg into the skin every 7 (seven) days.   tiZANidine (ZANAFLEX) 4 MG tablet Take 0.5-1 tablets (2-4 mg total) by mouth every 8 (eight) hours as needed for muscle spasms.   No facility-administered encounter medications on file as of 06/20/2022.    Allergies (verified) Patient has no known allergies.   History: Past Medical History:  Diagnosis Date   Complication of anesthesia    Hyperlipidemia    PONV (postoperative nausea and vomiting)    Past Surgical History:  Procedure Laterality Date   CHOLECYSTECTOMY  04/13/2011   Procedure: LAPAROSCOPIC CHOLECYSTECTOMY;  Surgeon: Fabio Bering, MD;  Location: [facility];  Service: General;  Laterality: N/A;   HEMORRHOID SURGERY  2003   [place]   SKIN LESION EXCISION  02/2018  mole removed from forehead    Family History  Problem Relation Age of Onset   Breast cancer Maternal Aunt    Cancer Mother    Heart disease Father    Heart attack Father        cause of death   Cancer Sister    Colon cancer Sister        dx 2020   Cancer Brother        metastatic w/ uncertain primary dx 2020   Anesthesia problems Neg Hx    Hypotension Neg Hx    Malignant hyperthermia Neg Hx    Pseudochol deficiency Neg Hx    Stomach cancer Neg Hx    Social History   Socioeconomic History   Marital status: Married    Spouse name: Charles   Number of children: 2   Years of education:  12   Highest education level: High school graduate  Occupational History   Occupation: retired  Tobacco Use   Smoking status: Never   Smokeless tobacco: Never  Vaping Use   Vaping Use: Never used  Substance and Sexual Activity   Alcohol use: No   Drug use: No   Sexual activity: Yes    Birth control/protection: Post-menopausal  Other Topics Concern   Not on file  Social History Narrative   Not on file   Social Determinants of Health   Financial Resource Strain: Low Risk  (06/20/2022)   Overall Financial Resource Strain (CARDIA)    Difficulty of Paying Living Expenses: Not hard at all  Food Insecurity: No Food Insecurity (06/20/2022)   Hunger Vital Sign    Worried About Running Out of Food in the Last Year: Never true    Ran Out of Food in the Last Year: Never true  Transportation Needs: No Transportation Needs (06/20/2022)   PRAPARE - Administrator, Civil Service (Medical): No    Lack of Transportation (Non-Medical): No  Physical Activity: Insufficiently Active (06/20/2022)   Exercise Vital Sign    Days of Exercise per Week: 3 days    Minutes of Exercise per Session: 30 min  Stress: No Stress Concern Present (06/20/2022)   [employer] of Occupational Health - Occupational Stress Questionnaire    Feeling of Stress : Not at all  Social Connections: Socially Integrated (06/20/2022)   Social Connection and Isolation Panel [NHANES]    Frequency of Communication with Friends and Family: More than three times a week    Frequency of Social Gatherings with Friends and Family: More than three times a week    Attends Religious Services: More than 4 times per year    Active Member of [employer] or Organizations: Yes    Attends Engineer, structural: More than 4 times per year    Marital Status: Married    Tobacco Counseling Counseling given: Not Answered   Clinical Intake:  Pre-visit preparation completed: Yes  Pain : No/denies pain  Diabetes: Yes CBG  done?: No Did pt. bring in CBG monitor from home?: No  How often do you need to have someone help you when you read instructions, pamphlets, or other written materials from your doctor or pharmacy?: 1 - Never  Diabetic?Yes   Nutrition Risk Assessment:  Has the patient had any N/V/D within the last 2 months?  No  Does the patient have any non-healing wounds?  No  Has the patient had any unintentional weight loss or weight gain?  No   Diabetes:  Is the patient  diabetic?  Yes  If diabetic, was a CBG obtained today?  No  Did the patient bring in their glucometer from home?  No  How often do you monitor your CBG's? daily.   Financial Strains and Diabetes Management:  Are you having any financial strains with the device, your supplies or your medication? No .  Does the patient want to be seen by Chronic Care Management for management of their diabetes?  No  Would the patient like to be referred to a Nutritionist or for Diabetic Management?  No   Diabetic Exams:  Diabetic Eye Exam: Completed records requested Diabetic Foot Exam: Completed 11/06/21   Interpreter Needed?: No  Information entered by :: Kandis Fantasia LPN   Activities of Daily Living    06/20/2022    2:57 PM 10/10/2021    8:26 AM  In your present state of health, do you have any difficulty performing the following activities:  Hearing? 0 0  Vision? 0 0  Difficulty concentrating or making decisions? 0 0  Walking or climbing stairs? 0 0  Dressing or bathing? 0 0  Doing errands, shopping? 0 0  Preparing Food and eating ? N N  Using the Toilet? N N  In the past six months, have you accidently leaked urine? N Y  Do you have problems with loss of bowel control? N N  Managing your Medications? N N  Managing your Finances? N N  Housekeeping or managing your Housekeeping? N N    Patient Care Team: Raliegh Ip, DO as PCP - General (Family Medicine) Annamaria Helling, MD as Attending Physician (Obstetrics and  Gynecology) Shawnie Dapper, NP as Nurse Practitioner (Family Medicine) Delora Fuel, OD (Optometry) Nita Sells, MD (Dermatology)  Indicate any recent Medical Services you may have received from other than [facility] providers in the past year (date may be approximate).     Assessment:   This is a routine wellness examination for [place].  Hearing/Vision screen Hearing Screening - Comments:: Hard of hearing  Vision Screening - Comments:: Wears rx glasses - up to date with routine eye exams with [facility]    Dietary issues and exercise activities discussed: Current Exercise Habits: The patient does not participate in regular exercise at present   Goals Addressed             This Visit's Progress    COMPLETED: Patient Stated       10/10/2021 AWV Goal: Exercise for General Health  Patient will verbalize understanding of the benefits of increased physical activity: Exercising regularly is important. It will improve your overall fitness, flexibility, and endurance. Regular exercise also will improve your overall health. It can help you control your weight, reduce stress, and improve your bone density. Over the next year, patient will increase physical activity as tolerated with a goal of at least 150 minutes of moderate physical activity per week.  You can tell that you are exercising at a moderate intensity if your heart starts beating faster and you start breathing faster but can still hold a conversation. Moderate-intensity exercise ideas include: Walking 1 mile (1.6 km) in about 15 minutes Biking Hiking Golfing Dancing Water aerobics Patient will verbalize understanding of everyday activities that increase physical activity by providing examples like the following: Yard work, such as: Insurance underwriter Gardening Washing windows or floors Patient will be able to explain general safety  guidelines for exercising:  Before you  start a new exercise program, talk with your health care provider. Do not exercise so much that you hurt yourself, feel dizzy, or get very short of breath. Wear comfortable clothes and wear shoes with good support. Drink plenty of water while you exercise to prevent dehydration or heat stroke. Work out until your breathing and your heartbeat get faster.      COMPLETED: Patient Stated       10/10/21 -She hopes to stay busy, camping, caring for granddaughter, maybe exercise more     Remain active and independent        Depression Screen    06/20/2022    2:56 PM 06/11/2022    9:47 AM 05/15/2022   10:17 AM 11/06/2021    8:31 AM 10/10/2021    8:26 AM 05/15/2021    8:12 AM 02/10/2021    8:43 AM  PHQ 2/9 Scores  PHQ - 2 Score 0 0 0 0 0 3 0  PHQ- 9 Score 0 0 0 2  8     Fall Risk    06/20/2022    2:56 PM 06/11/2022    9:47 AM 05/15/2022   10:17 AM 11/06/2021    8:31 AM 10/10/2021    8:22 AM  Fall Risk   Falls in the past year? 0 0 0 0 0  Number falls in past yr: 0 0 0  0  Injury with Fall? 0 0 0  0  Risk for fall due to : No Fall Risks No Fall Risks No Fall Risks  No Fall Risks  Follow up Falls prevention discussed;Education provided;Falls evaluation completed Education provided Education provided  Falls prevention discussed    FALL RISK PREVENTION PERTAINING TO THE HOME:  Any stairs in or around the home? No  If so, are there any without handrails? No  Home free of loose throw rugs in walkways, pet beds, electrical cords, etc? Yes  Adequate lighting in your home to reduce risk of falls? Yes   ASSISTIVE DEVICES UTILIZED TO PREVENT FALLS:  Life alert? No  Use of a cane, walker or w/c? No  Grab bars in the bathroom? Yes  Shower chair or bench in shower? No  Elevated toilet seat or a handicapped toilet? Yes   TIMED UP AND GO:  Was the test performed? No . Telephonic visit   Cognitive Function:        06/20/2022    2:57 PM 10/10/2021     8:27 AM 10/07/2020    8:27 AM 10/07/2019    8:29 AM 09/30/2018    8:43 AM  6CIT Screen  What Year? 0 points 0 points 0 points 0 points 0 points  What month? 0 points 0 points 0 points 0 points 0 points  What time? 0 points 0 points 0 points 0 points 0 points  Count back from 20 0 points 0 points 0 points 0 points 0 points  Months in reverse 0 points 4 points 0 points 4 points 4 points  Repeat phrase 0 points 0 points 4 points 4 points 2 points  Total Score 0 points 4 points 4 points 8 points 6 points    Immunizations Immunization History  Administered Date(s) Administered   Fluad Quad(high Dose 65+) 02/10/2021   Moderna SARS-COV2 Booster Vaccination 05/20/2020   Moderna Sars-Covid-2 Vaccination 04/24/2019, 05/27/2019   Pneumococcal Conjugate-13 01/07/2017   Pneumococcal Polysaccharide-23 04/07/2018   Td 05/30/1999   Zoster Recombinat (Shingrix) 11/06/2021    TDAP status: Due, Education has been provided regarding  the importance of this vaccine. Advised may receive this vaccine at local pharmacy or Health Dept. Aware to provide a copy of the vaccination record if obtained from local pharmacy or Health Dept. Verbalized acceptance and understanding.  Flu Vaccine status: Up to date  Pneumococcal vaccine status: Up to date  Covid-19 vaccine status: Information provided on how to obtain vaccines.   Qualifies for Shingles Vaccine? Yes   Zostavax completed No   Shingrix Completed?: No.    Education has been provided regarding the importance of this vaccine. Patient has been advised to call insurance company to determine out of pocket expense if they have not yet received this vaccine. Advised may also receive vaccine at local pharmacy or Health Dept. Verbalized acceptance and understanding.  Screening Tests Health Maintenance  Topic Date Due   COVID-19 Vaccine (4 - 2023-24 season) 10/27/2021   COLONOSCOPY (Pts 45-51yrs Insurance coverage will need to be confirmed)  03/14/2022    OPHTHALMOLOGY EXAM  05/03/2022   MAMMOGRAM  06/15/2022   Zoster Vaccines- Shingrix (2 of 2) 09/10/2022 (Originally 01/01/2022)   INFLUENZA VACCINE  09/27/2022   Diabetic kidney evaluation - Urine ACR  11/07/2022   FOOT EXAM  11/07/2022   HEMOGLOBIN A1C  11/15/2022   DEXA SCAN  02/11/2023   Diabetic kidney evaluation - eGFR measurement  06/11/2023   Medicare Annual Wellness (AWV)  06/20/2023   Pneumonia Vaccine 39+ Years old  Completed   Hepatitis C Screening  Completed   HPV VACCINES  Aged Out   DTaP/Tdap/Td  Discontinued    Health Maintenance  Health Maintenance Due  Topic Date Due   COVID-19 Vaccine (4 - 2023-24 season) 10/27/2021   COLONOSCOPY (Pts 45-72yrs Insurance coverage will need to be confirmed)  03/14/2022   OPHTHALMOLOGY EXAM  05/03/2022   MAMMOGRAM  06/15/2022    Colorectal cancer screening: Referral to GI placed 06/11/22. Pt aware the office will call re: appt.  Mammogram status:  Patient would like to postpone for now   Bone Density status: Completed 02/10/21. Results reflect: Bone density results: OSTEOPENIA. Repeat every 2 years.  Lung Cancer Screening: (Low Dose CT Chest recommended if Age 24-80 years, 30 pack-year currently smoking OR have quit w/in 15years.) does not qualify.   Lung Cancer Screening Referral: n/a  Additional Screening:  Hepatitis C Screening: does qualify; Completed 05/25/15  Vision Screening: Recommended annual ophthalmology exams for early detection of glaucoma and other disorders of the eye. Is the patient up to date with their annual eye exam?  Yes  Who is the provider or what is the name of the office in which the patient attends annual eye exams? [facility] If pt is not established with a provider, would they like to be referred to a provider to establish care? No .   Dental Screening: Recommended annual dental exams for proper oral hygiene  Community Resource Referral / Chronic Care Management: CRR required this visit?  No    CCM required this visit?  No      Plan:     I have personally reviewed and noted the following in the patient's chart:   Medical and social history Use of alcohol, tobacco or illicit drugs  Current medications and supplements including opioid prescriptions. Patient is not currently taking opioid prescriptions. Functional ability and status Nutritional status Physical activity Advanced directives List of other physicians Hospitalizations, surgeries, and ER visits in previous 12 months Vitals Screenings to include cognitive, depression, and falls Referrals and appointments  In addition, I  have reviewed and discussed with patient certain preventive protocols, quality metrics, and best practice recommendations. A written personalized care plan for preventive services as well as general preventive health recommendations were provided to patient.     Durwin Nora, [place]   2/44/0102   Due to this being a virtual visit, the after visit summary with patients personalized plan was offered to patient via mail or my-chart.  per request, patient was mailed a copy of AVS  Nurse Notes: No concerns

## 2022-07-08 ENCOUNTER — Other Ambulatory Visit: Payer: Self-pay

## 2022-07-08 ENCOUNTER — Inpatient Hospital Stay (HOSPITAL_COMMUNITY)
Admission: EM | Admit: 2022-07-08 | Discharge: 2022-07-10 | DRG: 390 | Disposition: A | Discharge status: Home / Self Care | Payer: Medicare HMO | Attending: Internal Medicine | Admitting: Internal Medicine

## 2022-07-08 ENCOUNTER — Encounter (HOSPITAL_COMMUNITY): Payer: Self-pay | Admitting: Emergency Medicine

## 2022-07-08 ENCOUNTER — Emergency Department (HOSPITAL_COMMUNITY): Payer: Medicare HMO

## 2022-07-08 DIAGNOSIS — K56609 Unspecified intestinal obstruction, unspecified as to partial versus complete obstruction: Secondary | ICD-10-CM | POA: Diagnosis not present

## 2022-07-08 DIAGNOSIS — K5651 Intestinal adhesions [bands], with partial obstruction: Secondary | ICD-10-CM | POA: Diagnosis not present

## 2022-07-08 DIAGNOSIS — K56600 Partial intestinal obstruction, unspecified as to cause: Principal | ICD-10-CM | POA: Diagnosis present

## 2022-07-08 DIAGNOSIS — Z7985 Long-term (current) use of injectable non-insulin antidiabetic drugs: Secondary | ICD-10-CM | POA: Diagnosis not present

## 2022-07-08 DIAGNOSIS — R000 Tachycardia, unspecified: Secondary | ICD-10-CM | POA: Diagnosis not present

## 2022-07-08 DIAGNOSIS — Z79899 Other long term (current) drug therapy: Secondary | ICD-10-CM

## 2022-07-08 DIAGNOSIS — E7800 Pure hypercholesterolemia, unspecified: Secondary | ICD-10-CM | POA: Diagnosis not present

## 2022-07-08 DIAGNOSIS — K3189 Other diseases of stomach and duodenum: Secondary | ICD-10-CM | POA: Diagnosis not present

## 2022-07-08 DIAGNOSIS — K529 Noninfective gastroenteritis and colitis, unspecified: Secondary | ICD-10-CM

## 2022-07-08 DIAGNOSIS — Z8249 Family history of ischemic heart disease and other diseases of the circulatory system: Secondary | ICD-10-CM | POA: Diagnosis not present

## 2022-07-08 DIAGNOSIS — E119 Type 2 diabetes mellitus without complications: Secondary | ICD-10-CM | POA: Diagnosis present

## 2022-07-08 DIAGNOSIS — E860 Dehydration: Secondary | ICD-10-CM | POA: Diagnosis not present

## 2022-07-08 DIAGNOSIS — J302 Other seasonal allergic rhinitis: Secondary | ICD-10-CM | POA: Diagnosis not present

## 2022-07-08 DIAGNOSIS — N281 Cyst of kidney, acquired: Secondary | ICD-10-CM | POA: Diagnosis not present

## 2022-07-08 HISTORY — DX: Partial intestinal obstruction, unspecified as to cause: K56.600

## 2022-07-08 LAB — COMPREHENSIVE METABOLIC PANEL
ALT: 67 U/L — ABNORMAL HIGH (ref 0–44)
AST: 62 U/L — ABNORMAL HIGH (ref 15–41)
Albumin: 4.6 g/dL (ref 3.5–5.0)
Alkaline Phosphatase: 70 U/L (ref 38–126)
Anion gap: 11 (ref 5–15)
BUN: 29 mg/dL — ABNORMAL HIGH (ref 8–23)
CO2: 24 mmol/L (ref 22–32)
Calcium: 9.4 mg/dL (ref 8.9–10.3)
Chloride: 101 mmol/L (ref 98–111)
Creatinine, Ser: 1.04 mg/dL — ABNORMAL HIGH (ref 0.44–1.00)
GFR, Estimated: 57 mL/min — ABNORMAL LOW (ref >60)
Glucose, Bld: 180 mg/dL — ABNORMAL HIGH (ref 70–99)
Potassium: 3.7 mmol/L (ref 3.5–5.1)
Sodium: 136 mmol/L (ref 135–145)
Total Bilirubin: 1.6 mg/dL — ABNORMAL HIGH (ref 0.3–1.2)
Total Protein: 7.9 g/dL (ref 6.5–8.1)

## 2022-07-08 LAB — CBC
HCT: 52.5 % — ABNORMAL HIGH (ref 36.0–46.0)
Hemoglobin: 17.5 g/dL — ABNORMAL HIGH (ref 12.0–15.0)
MCH: 31.3 pg (ref 26.0–34.0)
MCHC: 33.3 g/dL (ref 30.0–36.0)
MCV: 93.9 fL (ref 80.0–100.0)
Platelets: 226 10*3/uL (ref 150–400)
RBC: 5.59 MIL/uL — ABNORMAL HIGH (ref 3.87–5.11)
RDW: 11.5 % (ref 11.5–15.5)
WBC: 9.8 10*3/uL (ref 4.0–10.5)
nRBC: 0 % (ref 0.0–0.2)

## 2022-07-08 LAB — LIPASE, BLOOD: Lipase: 39 U/L (ref 11–51)

## 2022-07-08 MED ORDER — ACETAMINOPHEN 650 MG RE SUPP: 650.0000 mg | Freq: Four times a day (QID) | RECTAL | Status: DC | PRN

## 2022-07-08 MED ORDER — HEPARIN SODIUM (PORCINE) 5000 UNIT/ML IJ SOLN
5000.0000 [IU] | Freq: Three times a day (TID) | INTRAMUSCULAR | Status: DC
  Administered 2022-07-09 – 2022-07-10 (×4): 5000 [IU] via SUBCUTANEOUS
  Filled 2022-07-08 (×4): qty 1

## 2022-07-08 MED ORDER — IOHEXOL 300 MG/ML  SOLN
100.0000 mL | Freq: Once | INTRAMUSCULAR | Status: AC | PRN
  Administered 2022-07-08: 100 mL via INTRAVENOUS

## 2022-07-08 MED ORDER — MORPHINE SULFATE (PF) 2 MG/ML IV SOLN: 2.0000 mg | INTRAVENOUS | Status: DC | PRN

## 2022-07-08 MED ORDER — OXYCODONE HCL 5 MG PO TABS: 5.0000 mg | ORAL_TABLET | ORAL | Status: DC | PRN

## 2022-07-08 MED ORDER — SODIUM CHLORIDE 0.9 % IV BOLUS
1000.0000 mL | Freq: Once | INTRAVENOUS | Status: AC
  Administered 2022-07-08: 1000 mL via INTRAVENOUS

## 2022-07-08 MED ORDER — ONDANSETRON HCL 4 MG PO TABS: 4.0000 mg | ORAL_TABLET | Freq: Four times a day (QID) | ORAL | Status: DC | PRN

## 2022-07-08 MED ORDER — ONDANSETRON HCL 4 MG/2ML IJ SOLN: 4.0000 mg | Freq: Four times a day (QID) | INTRAMUSCULAR | Status: DC | PRN

## 2022-07-08 MED ORDER — ACETAMINOPHEN 325 MG PO TABS
650.0000 mg | ORAL_TABLET | Freq: Four times a day (QID) | ORAL | Status: DC | PRN
  Administered 2022-07-09: 650 mg via ORAL
  Filled 2022-07-08: qty 2

## 2022-07-08 MED ORDER — ONDANSETRON HCL 4 MG/2ML IJ SOLN
4.0000 mg | Freq: Once | INTRAMUSCULAR | Status: AC
  Administered 2022-07-08: 4 mg via INTRAVENOUS
  Filled 2022-07-08: qty 2

## 2022-07-08 NOTE — ED Triage Notes (Signed)
Pt c/o N/V/D since Friday night. Pt concerned that she may have food poisoning and be dehydrated now.

## 2022-07-08 NOTE — ED Provider Notes (Addendum)
Nichole Cunningham EMERGENCY DEPARTMENT AT [facility] Provider Note   CSN: [id number] Arrival date & time: 07/08/22  1900     History  Chief Complaint  Patient presents with   Emesis    Nichole Cunningham is a 72 y.o. female.   Emesis Patient presents with nausea vomiting diarrhea for 3 days now.  Thinks she may have some food poisoning.  No definite source however.  Pain in the upper abdomen worse with vomiting.  No blood in the emesis or diarrhea.  Not really hungry.    Past Medical History:  Diagnosis Date   Complication of anesthesia    Hyperlipidemia    PONV (postoperative nausea and vomiting)    Past Surgical History:  Procedure Laterality Date   CHOLECYSTECTOMY  04/13/2011   Procedure: LAPAROSCOPIC CHOLECYSTECTOMY;  Surgeon: Fabio Bering, MD;  Location: [facility];  Service: General;  Laterality: N/A;   HEMORRHOID SURGERY  2003   [place]   SKIN LESION EXCISION  02/2018   mole removed from forehead      Home Medications Prior to Admission medications   Medication Sig Start Date End Date Taking? Authorizing Provider  Cholecalciferol 50 MCG (2000 UT) TABS Take 1 tablet by mouth daily.    [provider]  fenofibrate 160 MG tablet TAKE 1 TABLET (160 MG TOTAL) DAILY BY MOUTH. 06/11/22   Delynn Flavin M, DO  glucose blood (ONETOUCH VERIO) test strip TEST BLOOD GLUCOSE DAILY AS NEEDED E11.9 (ONE TOUCH VERIO REFLECT) 05/15/22   Raliegh Ip, DO  Lancet Device MISC Test BGs once daily as needed E11.9 (one touch verio reflect) 05/15/22   Raliegh Ip, DO  levocetirizine (XYZAL) 5 MG tablet Take 1 tablet (5 mg total) by mouth every evening. For allergies/ drainage 05/15/22   Raliegh Ip, DO  Omega-3 Fatty Acids (OMEGA 3 PO) Take by mouth.    [provider]  rosuvastatin (CRESTOR) 10 MG tablet Take 1 tablet (10 mg total) by mouth daily. 06/11/22   Raliegh Ip, DO  Semaglutide,0.25 or 0.5MG /DOS, (OZEMPIC, 0.25 OR 0.5  MG/DOSE,) 2 MG/3ML SOPN Inject 0.5 mg into the skin every 7 (seven) days. 06/11/22 07/11/22  Raliegh Ip, DO  tiZANidine (ZANAFLEX) 4 MG tablet Take 0.5-1 tablets (2-4 mg total) by mouth every 8 (eight) hours as needed for muscle spasms. 11/06/21   Raliegh Ip, DO      Allergies    Patient has no known allergies.    Review of Systems   Review of Systems  Gastrointestinal:  Positive for vomiting.    Physical Exam Updated Vital Signs BP (!) 134/93   Pulse 95   Temp 98.9 F (37.2 C) (Oral)   Resp 20   Ht 5\' 2"  (1.575 m)   Wt 74.8 kg   SpO2 95%   BMI 30.18 kg/m  Physical Exam Vitals and nursing note reviewed.  HENT:     Head: Atraumatic.  Cardiovascular:     Rate and Rhythm: Tachycardia present.  Abdominal:     Tenderness: There is abdominal tenderness.     Comments: Some tenderness to upper abdomen and left lower quadrant.  No rebound or guarding.  No hernias palpated.  Musculoskeletal:        General: No tenderness.     Cervical back: Neck supple.  Neurological:     Mental Status: She is alert and oriented to person, place, and time.     ED Results / Procedures / Treatments  Labs (all labs ordered are listed, but only abnormal results are displayed) Labs Reviewed  COMPREHENSIVE METABOLIC PANEL - Abnormal; Notable for the following components:      Result Value   Glucose, Bld 180 (*)    BUN 29 (*)    Creatinine, Ser 1.04 (*)    AST 62 (*)    ALT 67 (*)    Total Bilirubin 1.6 (*)    GFR, Estimated 57 (*)    All other components within normal limits  CBC - Abnormal; Notable for the following components:   RBC 5.59 (*)    Hemoglobin 17.5 (*)    HCT 52.5 (*)    All other components within normal limits  LIPASE, BLOOD  URINALYSIS, ROUTINE W REFLEX MICROSCOPIC    EKG EKG Interpretation  Date/Time:  Sunday Jul 08 2022 19:32:47 EDT Ventricular Rate:  108 PR Interval:  155 QRS Duration: 66 QT Interval:  325 QTC Calculation: 436 R  Axis:   -6 Text Interpretation: Sinus tachycardia Low voltage, precordial leads Consider anterior infarct Confirmed by Benjiman Core [phone]) on 07/08/2022 8:45:08 PM  Radiology CT ABDOMEN PELVIS W CONTRAST  Result Date: 07/08/2022 CLINICAL DATA: Epigastric pain for several days EXAM: CT ABDOMEN AND PELVIS WITH CONTRAST TECHNIQUE: Multidetector CT imaging of the abdomen and pelvis was performed using the standard protocol following bolus administration of intravenous contrast. RADIATION DOSE REDUCTION: This exam was performed according to the departmental dose-optimization program which includes automated exposure control, adjustment of the mA and/or kV according to patient size and/or use of iterative reconstruction technique. CONTRAST:  OMNIPAQUE IOHEXOL 300 MG/ML  SOLN COMPARISON:  None Available. FINDINGS: Lower chest: No acute abnormality. Hepatobiliary: Fatty infiltration of the liver is noted. Gallbladder has been surgically removed. Pancreas: Unremarkable. No pancreatic ductal dilatation or surrounding inflammatory changes. Spleen: Normal in size without focal abnormality. Adrenals/Urinary Tract: Adrenal glands are within normal limits. Kidneys show normal enhancement pattern. No renal calculi or obstructive changes are noted. A few scattered small simple cysts are noted. No further follow-up is recommended. The bladder is well distended. Stomach/Bowel: No obstructive or inflammatory changes of the colon are noted. Appendix is not visualized and may have been surgically removed. No inflammatory changes to suggest appendicitis are noted. Stomach is within normal limits. Mild dilatation of the proximal ileum is noted with a transition zone identified in the right lower quadrant on image number 57 of series 2. This may be related to adhesions as no focal lesion is noted. Or distal ileum appears within normal limits. Vascular/Lymphatic: Aortic atherosclerosis. No enlarged abdominal or pelvic lymph  nodes. Reproductive: Uterus and bilateral adnexa are unremarkable. Other: No abdominal wall hernia or abnormality. No abdominopelvic ascites. Musculoskeletal: No acute or significant osseous findings. IMPRESSION: Dilated loops of proximal ileum with a transition zone in the right lower quadrant several cm proximal to the terminal ileum. This is consistent with partial small bowel obstruction likely related to adhesions. No focal mass is noted. No other focal abnormality is noted. Electronically Signed   By: Alcide Clever M.D.   On: 07/08/2022 21:40    Procedures Procedures    Medications Ordered in ED Medications  sodium chloride 0.9 % bolus 1,000 mL (0 mLs Intravenous Stopped 07/08/22 2122)  ondansetron (ZOFRAN) injection 4 mg (4 mg Intravenous Given 07/08/22 1941)  iohexol (OMNIPAQUE) 300 MG/ML solution 100 mL (100 mLs Intravenous Contrast Given 07/08/22 2111)    ED Course/ Medical Decision Making/ A&P  Medical Decision Making Amount and/or Complexity of Data Reviewed Labs: ordered. Radiology: ordered.  Risk Prescription drug management. Decision regarding hospitalization.   Patient presents with nausea vomiting diarrhea and abdominal pain.  Patient thinks potential food poisoning which is on the differential diagnosis.  Also nonspecific gastroenteritis.  However does have pain in the epigastric to right upper quadrant area.  LFTs mildly elevated.  Also some left lower quadrant tenderness.  Heart rate improved somewhat after IV fluids but continued tenderness.  Will get CT scan to further evaluate.  CT scan shows potential partial small bowel obstruction.  Transition zone in right lower quadrant.  Has had previous cholecystectomy.  Has not had vomiting for the last couple days with continued diarrhea and pain.  Discussed with Dr. Henreitta Leber.  Will admit to internal medicine and she will follow.  No immediate plans for surgery.        Final Clinical  Impression(s) / ED Diagnoses Final diagnoses:  Partial small bowel obstruction [facility])    Rx / DC Orders ED Discharge Orders     None         Benjiman Core, MD 07/08/22 2055    Benjiman Core, MD 07/08/22 2316

## 2022-07-09 ENCOUNTER — Inpatient Hospital Stay (HOSPITAL_COMMUNITY): Payer: Medicare HMO

## 2022-07-09 DIAGNOSIS — K56609 Unspecified intestinal obstruction, unspecified as to partial versus complete obstruction: Secondary | ICD-10-CM | POA: Diagnosis not present

## 2022-07-09 DIAGNOSIS — E7800 Pure hypercholesterolemia, unspecified: Secondary | ICD-10-CM

## 2022-07-09 DIAGNOSIS — K529 Noninfective gastroenteritis and colitis, unspecified: Secondary | ICD-10-CM

## 2022-07-09 DIAGNOSIS — K56600 Partial intestinal obstruction, unspecified as to cause: Secondary | ICD-10-CM

## 2022-07-09 LAB — CBC WITH DIFFERENTIAL/PLATELET
Abs Immature Granulocytes: 0.02 10*3/uL (ref 0.00–0.07)
Basophils Absolute: 0 10*3/uL (ref 0.0–0.1)
Basophils Relative: 0 %
Eosinophils Absolute: 0.1 10*3/uL (ref 0.0–0.5)
Eosinophils Relative: 1 %
HCT: 48.2 % — ABNORMAL HIGH (ref 36.0–46.0)
Hemoglobin: 16 g/dL — ABNORMAL HIGH (ref 12.0–15.0)
Immature Granulocytes: 0 %
Lymphocytes Relative: 31 %
Lymphs Abs: 2 10*3/uL (ref 0.7–4.0)
MCH: 31.7 pg (ref 26.0–34.0)
MCHC: 33.2 g/dL (ref 30.0–36.0)
MCV: 95.6 fL (ref 80.0–100.0)
Monocytes Absolute: 0.7 10*3/uL (ref 0.1–1.0)
Monocytes Relative: 11 %
Neutro Abs: 3.6 10*3/uL (ref 1.7–7.7)
Neutrophils Relative %: 57 %
Platelets: 199 10*3/uL (ref 150–400)
RBC: 5.04 MIL/uL (ref 3.87–5.11)
RDW: 11.6 % (ref 11.5–15.5)
WBC: 6.4 10*3/uL (ref 4.0–10.5)
nRBC: 0 % (ref 0.0–0.2)

## 2022-07-09 LAB — COMPREHENSIVE METABOLIC PANEL
ALT: 49 U/L — ABNORMAL HIGH (ref 0–44)
AST: 40 U/L (ref 15–41)
Albumin: 3.9 g/dL (ref 3.5–5.0)
Alkaline Phosphatase: 58 U/L (ref 38–126)
Anion gap: 9 (ref 5–15)
BUN: 23 mg/dL (ref 8–23)
CO2: 25 mmol/L (ref 22–32)
Calcium: 8.9 mg/dL (ref 8.9–10.3)
Chloride: 104 mmol/L (ref 98–111)
Creatinine, Ser: 0.84 mg/dL (ref 0.44–1.00)
GFR, Estimated: >60 mL/min (ref >60)
Glucose, Bld: 105 mg/dL — ABNORMAL HIGH (ref 70–99)
Potassium: 4.1 mmol/L (ref 3.5–5.1)
Sodium: 138 mmol/L (ref 135–145)
Total Bilirubin: 1.4 mg/dL — ABNORMAL HIGH (ref 0.3–1.2)
Total Protein: 6.8 g/dL (ref 6.5–8.1)

## 2022-07-09 LAB — URINALYSIS, ROUTINE W REFLEX MICROSCOPIC
Bilirubin Urine: NEGATIVE
Glucose, UA: NEGATIVE mg/dL
Hgb urine dipstick: NEGATIVE
Ketones, ur: NEGATIVE mg/dL
Leukocytes,Ua: NEGATIVE
Nitrite: NEGATIVE
Protein, ur: NEGATIVE mg/dL
Specific Gravity, Urine: >1.046 — ABNORMAL HIGH (ref 1.005–1.030)
pH: 5 (ref 5.0–8.0)

## 2022-07-09 LAB — MAGNESIUM: Magnesium: 2.2 mg/dL (ref 1.7–2.4)

## 2022-07-09 MED ORDER — ORAL CARE MOUTH RINSE: 15.0000 mL | OROMUCOSAL | Status: DC | PRN

## 2022-07-09 MED ORDER — SODIUM CHLORIDE 0.9 % IV SOLN: INTRAVENOUS | Status: AC

## 2022-07-09 MED ORDER — MORPHINE SULFATE (PF) 2 MG/ML IV SOLN: 2.0000 mg | INTRAVENOUS | Status: DC | PRN

## 2022-07-09 NOTE — Consult Note (Addendum)
I was present with the medical student for this service. I personally verified the history of present illness, performed the physical exam, and made the plan for this encounter. I have verified the medical student's documentation and made modifications where appropriately. I have personally documented in my own words a brief history, physical, and plan below.     Patient with nausea/vomiting, abdominal pain and diarrhea. Family sick. I think this is more like viral gastroenteritis.  CT transition to me looks more like a gradual taper of bowel in the right lower abdomen Clear diet now Adv as tolerated Hopefully home in the next 24-48 hours if adv diet No surgical intervention indicated  Nichole Greenhouse, MD [facility] [address] [place], [place] [address] [phone] (office)   Reason for Consult:SBO Referring Physician: Dr. Jasper Loser Nichole Cunningham is an 72 y.o. female.  HPI:   Nichole Cunningham is a 72 y/o female with PMHx of cholecystectomy and hyperlipidemia who presented with a 2 day history of nausea, vomiting and diarrhea. She states that on Friday afternoon she went to a [place] and had ground beef tacos that "didn't taste right." Friday night, she started having nausea, vomiting and diarrhea. She believes that she has food poisoning. She denies fever, chills or diaphoresis. She has not eaten since Friday. She has had one episode of diarrhea last night.  Of note, her husband states that a few family members who have been around Nichole Cunningham have also been sick.   Past Medical History:  Diagnosis Date   Complication of anesthesia    Hyperlipidemia    PONV (postoperative nausea and vomiting)     Past Surgical History:  Procedure Laterality Date   CHOLECYSTECTOMY  04/13/2011   Procedure: LAPAROSCOPIC CHOLECYSTECTOMY;  Surgeon: Fabio Bering, MD;  Location: [facility];  Service: General;  Laterality: N/A;   HEMORRHOID SURGERY  2003    [place]   SKIN LESION EXCISION  02/2018   mole removed from forehead     Family History  Problem Relation Age of Onset   Breast cancer Maternal Aunt    Cancer Mother    Heart disease Father    Heart attack Father        cause of death   Cancer Sister    Colon cancer Sister        dx 2020   Cancer Brother        metastatic w/ uncertain primary dx 2020   Anesthesia problems Neg Hx    Hypotension Neg Hx    Malignant hyperthermia Neg Hx    Pseudochol deficiency Neg Hx    Stomach cancer Neg Hx     Social History:  reports that she has never smoked. She has never used smokeless tobacco. She reports that she does not drink alcohol and does not use drugs.  Allergies: No Known Allergies  Medications: I have reviewed the patient's current medications. Current Facility-Administered Medications  Medication Dose Route Frequency Provider Last Rate Last Admin   0.9 %  sodium chloride infusion   Intravenous Continuous Vassie Loll, MD       acetaminophen (TYLENOL) tablet 650 mg  650 mg Oral Q6H PRN Zierle-Ghosh, Asia B, DO       Or   acetaminophen (TYLENOL) suppository 650 mg  650 mg Rectal Q6H PRN Zierle-Ghosh, Asia B, DO       heparin injection 5,000 Units  5,000 Units Subcutaneous Q8H Zierle-Ghosh, Asia B, DO   5,000  Units at 07/09/22 0527   morphine (PF) 2 MG/ML injection 2 mg  2 mg Intravenous Q4H PRN Vassie Loll, MD       ondansetron [facility]) tablet 4 mg  4 mg Oral Q6H PRN Zierle-Ghosh, Asia B, DO       Or   ondansetron (ZOFRAN) injection 4 mg  4 mg Intravenous Q6H PRN Zierle-Ghosh, Asia B, DO       oxyCODONE (Oxy IR/ROXICODONE) immediate release tablet 5 mg  5 mg Oral Q4H PRN Zierle-Ghosh, Asia B, DO        Results for orders placed or performed during the hospital encounter of 07/08/22 (from the past 48 hour(s))  Lipase, blood     Status: None   Collection Time: 07/08/22  7:33 PM  Result Value Ref Range   Lipase 39 11 - 51 U/L    Comment: Performed at [facility], [address] [place], [place] [address]  Comprehensive metabolic panel     Status: Abnormal   Collection Time: 07/08/22  7:33 PM  Result Value Ref Range   Sodium 136 135 - 145 mmol/L   Potassium 3.7 3.5 - 5.1 mmol/L   Chloride 101 98 - 111 mmol/L   CO2 24 22 - 32 mmol/L   Glucose, Bld 180 (H) 70 - 99 mg/dL    Comment: Glucose reference range applies only to samples taken after fasting for at least 8 hours.   BUN 29 (H) 8 - 23 mg/dL   Creatinine, Ser 1.91 (H) 0.44 - 1.00 mg/dL   Calcium 9.4 8.9 - 47.8 mg/dL   Total Protein 7.9 6.5 - 8.1 g/dL   Albumin 4.6 3.5 - 5.0 g/dL   AST 62 (H) 15 - 41 U/L   ALT 67 (H) 0 - 44 U/L   Alkaline Phosphatase 70 38 - 126 U/L   Total Bilirubin 1.6 (H) 0.3 - 1.2 mg/dL   GFR, Estimated 57 (L) >60 mL/min    Comment: (NOTE) Calculated using the CKD-EPI Creatinine Equation (2021)    Anion gap 11 5 - 15    Comment: Performed at [facility], [address], [place], [place] [address]  CBC     Status: Abnormal   Collection Time: 07/08/22  7:33 PM  Result Value Ref Range   WBC 9.8 4.0 - 10.5 K/uL   RBC 5.59 (H) 3.87 - 5.11 MIL/uL   Hemoglobin 17.5 (H) 12.0 - 15.0 g/dL   HCT 13.0 (H) 86.5 - 78.4 %   MCV 93.9 80.0 - 100.0 fL   MCH 31.3 26.0 - 34.0 pg   MCHC 33.3 30.0 - 36.0 g/dL   RDW 69.6 29.5 - 28.4 %   Platelets 226 150 - 400 K/uL   nRBC 0.0 0.0 - 0.2 %    Comment: Performed at [facility], [address] [place], [place] [address]  Comprehensive metabolic panel     Status: Abnormal   Collection Time: 07/09/22  4:15 AM  Result Value Ref Range   Sodium 138 135 - 145 mmol/L   Potassium 4.1 3.5 - 5.1 mmol/L   Chloride 104 98 - 111 mmol/L   CO2 25 22 - 32 mmol/L   Glucose, Bld 105 (H) 70 - 99 mg/dL    Comment: Glucose reference range applies only to samples taken after fasting for at least 8 hours.   BUN 23 8 - 23 mg/dL   Creatinine, Ser 0.10 0.44 - 1.00 mg/dL   Calcium 8.9 8.9 - 27.2 mg/dL  Total Protein 6.8 6.5 - 8.1 g/dL   Albumin 3.9  3.5 - 5.0 g/dL   AST 40 15 - 41 U/L   ALT 49 (H) 0 - 44 U/L   Alkaline Phosphatase 58 38 - 126 U/L   Total Bilirubin 1.4 (H) 0.3 - 1.2 mg/dL   GFR, Estimated >95 >62 mL/min    Comment: (NOTE) Calculated using the CKD-EPI Creatinine Equation (2021)    Anion gap 9 5 - 15    Comment: Performed at [facility], [address], [place], [place] [address]  Magnesium     Status: None   Collection Time: 07/09/22  4:15 AM  Result Value Ref Range   Magnesium 2.2 1.7 - 2.4 mg/dL    Comment: Performed at [facility], [address], [place], [place] [address]  CBC with Differential/Platelet     Status: Abnormal   Collection Time: 07/09/22  4:15 AM  Result Value Ref Range   WBC 6.4 4.0 - 10.5 K/uL   RBC 5.04 3.87 - 5.11 MIL/uL   Hemoglobin 16.0 (H) 12.0 - 15.0 g/dL   HCT 96.2 (H) 95.2 - 84.1 %   MCV 95.6 80.0 - 100.0 fL   MCH 31.7 26.0 - 34.0 pg   MCHC 33.2 30.0 - 36.0 g/dL   RDW 32.4 40.1 - 02.7 %   Platelets 199 150 - 400 K/uL   nRBC 0.0 0.0 - 0.2 %   Neutrophils Relative % 57 %   Neutro Abs 3.6 1.7 - 7.7 K/uL   Lymphocytes Relative 31 %   Lymphs Abs 2.0 0.7 - 4.0 K/uL   Monocytes Relative 11 %   Monocytes Absolute 0.7 0.1 - 1.0 K/uL   Eosinophils Relative 1 %   Eosinophils Absolute 0.1 0.0 - 0.5 K/uL   Basophils Relative 0 %   Basophils Absolute 0.0 0.0 - 0.1 K/uL   Immature Granulocytes 0 %   Abs Immature Granulocytes 0.02 0.00 - 0.07 K/uL    Comment: Performed at [facility], [address], [place], [place] [address]  Urinalysis, Routine w reflex microscopic -Urine, Clean Catch     Status: Abnormal   Collection Time: 07/09/22  5:50 AM  Result Value Ref Range   Color, Urine YELLOW YELLOW   APPearance CLEAR CLEAR   Specific Gravity, Urine >1.046 (H) 1.005 - 1.030   pH 5.0 5.0 - 8.0   Glucose, UA NEGATIVE NEGATIVE mg/dL   Hgb urine dipstick NEGATIVE NEGATIVE   Bilirubin Urine NEGATIVE NEGATIVE   Ketones, ur NEGATIVE NEGATIVE mg/dL   Protein, ur NEGATIVE NEGATIVE mg/dL    Nitrite NEGATIVE NEGATIVE   Leukocytes,Ua NEGATIVE NEGATIVE    Comment: Performed at [facility], [address], [place], [place] [address]    DG Abd 1 View  Result Date: 07/09/2022 CLINICAL DATA:  Small bowel obstruction EXAM: ABDOMEN - 1 VIEW COMPARISON:  Abdominal CT from yesterday FINDINGS: A loop of mildly dilated small bowel in the central abdomen is non progressed. Similar degree of diffuse colonic gas. No concerning mass effect or gas collection. Cholecystectomy clips. IMPRESSION: Unchanged loop of mildly distended small bowel in the central abdomen. Electronically Signed   By: Tiburcio Pea M.D.   On: 07/09/2022 06:15   CT ABDOMEN PELVIS W CONTRAST  Result Date: 07/08/2022 CLINICAL DATA: Epigastric pain for several days EXAM: CT ABDOMEN AND PELVIS WITH CONTRAST TECHNIQUE: Multidetector CT imaging of the abdomen and pelvis was performed using the standard protocol following bolus administration of intravenous contrast. RADIATION DOSE REDUCTION:  This exam was performed according to the departmental dose-optimization program which includes automated exposure control, adjustment of the mA and/or kV according to patient size and/or use of iterative reconstruction technique. CONTRAST:  OMNIPAQUE IOHEXOL 300 MG/ML  SOLN COMPARISON:  None Available. FINDINGS: Lower chest: No acute abnormality. Hepatobiliary: Fatty infiltration of the liver is noted. Gallbladder has been surgically removed. Pancreas: Unremarkable. No pancreatic ductal dilatation or surrounding inflammatory changes. Spleen: Normal in size without focal abnormality. Adrenals/Urinary Tract: Adrenal glands are within normal limits. Kidneys show normal enhancement pattern. No renal calculi or obstructive changes are noted. A few scattered small simple cysts are noted. No further follow-up is recommended. The bladder is well distended. Stomach/Bowel: No obstructive or inflammatory changes of the colon are noted. Appendix is not  visualized and may have been surgically removed. No inflammatory changes to suggest appendicitis are noted. Stomach is within normal limits. Mild dilatation of the proximal ileum is noted with a transition zone identified in the right lower quadrant on image number 57 of series 2. This may be related to adhesions as no focal lesion is noted. Or distal ileum appears within normal limits. Vascular/Lymphatic: Aortic atherosclerosis. No enlarged abdominal or pelvic lymph nodes. Reproductive: Uterus and bilateral adnexa are unremarkable. Other: No abdominal wall hernia or abnormality. No abdominopelvic ascites. Musculoskeletal: No acute or significant osseous findings. IMPRESSION: Dilated loops of proximal ileum with a transition zone in the right lower quadrant several cm proximal to the terminal ileum. This is consistent with partial small bowel obstruction likely related to adhesions. No focal mass is noted. No other focal abnormality is noted. Electronically Signed   By: Alcide Clever M.D.   On: 07/08/2022 21:40    ROS:  Pertinent items are noted in HPI.  Blood pressure 121/83, pulse 83, temperature 98 F (36.7 C), temperature source Oral, resp. rate 18, height 5\' 2"  (1.575 m), weight 74.3 kg, SpO2 97 %. Physical Exam:   Physical Exam Constitutional:      General: She is not in acute distress. HENT:     Head: Normocephalic and atraumatic.     Nose: Nose normal.  Eyes:     Extraocular Movements: Extraocular movements intact.  Cardiovascular:     Rate and Rhythm: Normal rate and regular rhythm.     Pulses: Normal pulses.     Heart sounds: Normal heart sounds.  Pulmonary:     Effort: Pulmonary effort is normal.     Breath sounds: Normal breath sounds.  Abdominal:     Palpations: Abdomen is soft.     Tenderness: There is abdominal tenderness. There is no guarding or rebound.     Comments: Mildly tender  Musculoskeletal:        General: Normal range of motion.  Skin:    General: Skin is warm.   Neurological:     Mental Status: She is alert and oriented to person, place, and time.  Psychiatric:        Mood and Affect: Mood normal.        Behavior: Behavior normal.      Assessment/Plan:  Nichole Cunningham is a 72 y/o female with PMHx of cholecystectomy and hyperlipidemia who presented with a 2 day history of nausea, vomiting and diarrhea and admitted for SBO.  #SBO vs Gastroenteritis Patient's presentation is most concerning for SBO or gastroenteritis. Her CT shows mild dilation of small bowel in central abdomen. Her past medical history of cholecystectomy points makes abdominal adhesions from previous surgery the most probable  etiology of SBO. She does not have a complete SBO as evidenced from her CT imaging as well as due to her continuing to have diarrhea, which is reassuring.   I think it is safe to advance her diet to clears and see how she tolerates it. She does not need an NG Tube decompression at this time because her nausea has resolved. If she is not improving within 2 days, can consider a potential infectious cause such as gastroenteritis given the history at bedside that a few other family members have been sick as well. She has been consistently afebrile and has no leukocytosis since admission but I want to monitor her fever curve.    Nichole Cunningham 07/09/2022, 10:58 AM

## 2022-07-09 NOTE — H&P (Signed)
History and Physical    Patient: Nichole Cunningham:[id number] DOB: Dec 13, 1950 DOA: 07/08/2022 DOS: the patient was seen and examined on 07/09/2022 PCP: Raliegh Ip, DO  Patient coming from: Home  Chief Complaint:  Chief Complaint  Patient presents with   Emesis   HPI: Nichole Cunningham is a 72 y.o. female with medical history significant of cholecystectomy, hyperlipidemia, and more presents the ED with a chief complaint of food poisoning.  Patient reports that she had nausea, vomiting, diarrhea that started Friday night.  She thought it was food poisoning.  She did not have a fever.  There was no hematemesis, hematochezia, melena.  She did not have associated crampy abdominal pain, just reports that she felt upset from throwing up.  Patient reports her cholecystectomy was many years ago.  She has never had an SBO.  Since her symptoms started she has not attempted p.o. intake.  Her last meal was Friday at lunchtime.  Her last normal bowel movement was Thursday.  Since then she has had several watery bowel movements.  She still having flatulence.  Patient has no other complaints at this time.  Patient does not smoke, does not drink, does not use illicit drugs.  She is vaccinated for COVID and flu.  Patient is full code. Review of Systems: As mentioned in the history of present illness. All other systems reviewed and are negative. Past Medical History:  Diagnosis Date   Complication of anesthesia    Hyperlipidemia    PONV (postoperative nausea and vomiting)    Past Surgical History:  Procedure Laterality Date   CHOLECYSTECTOMY  04/13/2011   Procedure: LAPAROSCOPIC CHOLECYSTECTOMY;  Surgeon: Fabio Bering, MD;  Location: [facility];  Service: General;  Laterality: N/A;   HEMORRHOID SURGERY  2003   [place]   SKIN LESION EXCISION  02/2018   mole removed from forehead    Social History:  reports that she has never smoked. She has never used smokeless tobacco. She reports that she does  not drink alcohol and does not use drugs.  No Known Allergies  Family History  Problem Relation Age of Onset   Breast cancer Maternal Aunt    Cancer Mother    Heart disease Father    Heart attack Father        cause of death   Cancer Sister    Colon cancer Sister        dx 2020   Cancer Brother        metastatic w/ uncertain primary dx 2020   Anesthesia problems Neg Hx    Hypotension Neg Hx    Malignant hyperthermia Neg Hx    Pseudochol deficiency Neg Hx    Stomach cancer Neg Hx     Prior to Admission medications   Medication Sig Start Date End Date Taking? Authorizing Provider  Cholecalciferol 50 MCG (2000 UT) TABS Take 1 tablet by mouth daily.    [provider]  fenofibrate 160 MG tablet TAKE 1 TABLET (160 MG TOTAL) DAILY BY MOUTH. 06/11/22   Delynn Flavin M, DO  glucose blood (ONETOUCH VERIO) test strip TEST BLOOD GLUCOSE DAILY AS NEEDED E11.9 (ONE TOUCH VERIO REFLECT) 05/15/22   Raliegh Ip, DO  Lancet Device MISC Test BGs once daily as needed E11.9 (one touch verio reflect) 05/15/22   Raliegh Ip, DO  levocetirizine (XYZAL) 5 MG tablet Take 1 tablet (5 mg total) by mouth every evening. For allergies/ drainage 05/15/22   Raliegh Ip,  DO  Omega-3 Fatty Acids (OMEGA 3 PO) Take by mouth.    [provider]  rosuvastatin (CRESTOR) 10 MG tablet Take 1 tablet (10 mg total) by mouth daily. 06/11/22   Raliegh Ip, DO  Semaglutide,0.25 or 0.5MG /DOS, (OZEMPIC, 0.25 OR 0.5 MG/DOSE,) 2 MG/3ML SOPN Inject 0.5 mg into the skin every 7 (seven) days. 06/11/22 07/11/22  Raliegh Ip, DO  tiZANidine (ZANAFLEX) 4 MG tablet Take 0.5-1 tablets (2-4 mg total) by mouth every 8 (eight) hours as needed for muscle spasms. 11/06/21   Raliegh Ip, DO    Physical Exam: Vitals:   07/08/22 2330 07/08/22 2345 07/08/22 2350 07/09/22 0300  BP: (!) 130/90  (!) 141/95 (!) 120/98  Pulse: 86  96 84  Resp: 17  18 18   Temp:   98 F (36.7 C) 98 F  (36.7 C)  TempSrc:      SpO2: 94%  97% 98%  Weight:  74.3 kg    Height:       1.  General: Patient lying supine in bed,  no acute distress   2. Psychiatric: Alert and oriented x 3, mood and behavior normal for situation, pleasant and cooperative with exam   3. Neurologic: Speech and language are normal, face is symmetric, moves all 4 extremities voluntarily, at baseline without acute deficits on limited exam   4. HEENMT:  Head is atraumatic, normocephalic, pupils reactive to light, neck is supple, trachea is midline, mucous membranes are moist   5. Respiratory : Lungs are clear to auscultation bilaterally without wheezing, rhonchi, rales, no cyanosis, no increase in work of breathing or accessory muscle use   6. Cardiovascular : Heart rate normal, rhythm is regular, no murmurs, rubs or gallops, no peripheral edema, peripheral pulses palpated   7. Gastrointestinal:  Abdomen is soft, mildly distended, tender to palpation in the periumbilical region, bowel sounds active, no masses or organomegaly palpated   8. Skin:  Skin is warm, dry and intact without rashes, acute lesions, or ulcers on limited exam   9.Musculoskeletal:  No acute deformities or trauma, no asymmetry in tone, no peripheral edema, peripheral pulses palpated, no tenderness to palpation in the extremities  Data Reviewed: In the ED Temp 98.9, heart rate of 95-20 13, respiratory rate 15-26, blood pressure 133/67,  no leukocytosis with a white blood cell count of 9.8 Hemoglobin stable at 17.5 Chemistry is unremarkable aside from a hyperglycemia at 180 AST 62, ALT 67 CT abdomen pelvis shows partial SBO likely related to adhesions EKG shows a heart rate of 108, sinus tach, QTc 436 ED provider discussed case with general surgery Admission was requested for partial SBO  Assessment and Plan: * SBO (small bowel obstruction) (HCC) - CT abdomen shows partial SBO likely related to adhesions - Patient does have a  history of cholecystectomy - No previous SBO to her knowledge - KUB to monitor progression this a.m. - Pain control - N.p.o. - Patient is not actively nauseous, no NG tube in place  Hypercholesterolemia - Resume fenofibrate and Crestor when patient is able to tolerate p.o.      Advance Care Planning:   Code Status: Full Code  Consults: General surgery  Family Communication: No family at bedside  Severity of Illness: The appropriate patient status for this patient is INPATIENT. Inpatient status is judged to be reasonable and necessary in order to provide the required intensity of service to ensure the patient's safety. The patient's presenting symptoms, physical exam findings, and initial radiographic  and laboratory data in the context of their chronic comorbidities is felt to place them at high risk for further clinical deterioration. Furthermore, it is not anticipated that the patient will be medically stable for discharge from the hospital within 2 midnights of admission.   * I certify that at the point of admission it is my clinical judgment that the patient will require inpatient hospital care spanning beyond 2 midnights from the point of admission due to high intensity of service, high risk for further deterioration and high frequency of surveillance required.*  Author: Lilyan Gilford, DO 07/09/2022 5:06 AM  For on call review [email or web address].

## 2022-07-09 NOTE — Assessment & Plan Note (Signed)
-   CT abdomen shows partial SBO likely related to adhesions - Patient does have a history of cholecystectomy - No previous SBO to her knowledge - KUB to monitor progression this a.m. - Pain control - N.p.o. - Patient is not actively nauseous, no NG tube in place

## 2022-07-09 NOTE — Progress Notes (Signed)
Patient seen and examined; admitted to the hospital secondary to abdominal pain with associated nausea/vomiting.  Patient also expressed having some diarrhea.  Initially felt her condition was secondary to food poisoning and was concern for dehydration.  Workup in the emergency department demonstrating partial SBO to be most likely responsible for his symptoms.  Patient is afebrile, hemodynamically stable and with normal WBCs.  At time of my evaluation expressed no further episode of nausea or vomiting and is willing to have some liquid diet.  Refer to H&P written by Dr. Carren Rang for further info/details on admission.  Plan: -Continue conservative management -Continue to maintain adequate hydration and follow -Diet advanced to clear liquids -Continue as needed antiemetics and analgesics -General surgery has been consulted and will follow any further recommendations.  Vassie Loll MD [phone]

## 2022-07-09 NOTE — Assessment & Plan Note (Signed)
-   Resume fenofibrate and Crestor when patient is able to tolerate p.o.

## 2022-07-09 NOTE — TOC Progression Note (Signed)
Transition of Care [facility]) - Progression Note    Patient Details  Name: Nichole Cunningham MRN: [id number] Date of Birth: Nov 24, 1950  Transition of Care [facility]) CM/SW Contact  Karn Cassis, [place] Phone Number: 07/09/2022, 9:48 AM  Clinical Narrative:   Transition of Care [facility]) Screening Note   Patient Details  Name: Nichole Cunningham Date of Birth: November 01, 1950   Transition of Care [facility]) CM/SW Contact:    Karn Cassis, LCSW Phone Number: 07/09/2022, 9:48 AM    Transition of Care Department [facility]) has reviewed patient and no TOC needs have been identified at this time. We will continue to monitor patient advancement through interdisciplinary progression rounds. If new patient transition needs arise, please place a TOC consult.            Expected Discharge Plan and Services                                               Social Determinants of Health (SDOH) Interventions SDOH Screenings   Food Insecurity: No Food Insecurity (07/08/2022)  Housing: Patient Declined (07/08/2022)  Transportation Needs: No Transportation Needs (07/08/2022)  Utilities: Not At Risk (07/08/2022)  Alcohol Screen: Low Risk  (06/20/2022)  Depression (PHQ2-9): Low Risk  (06/20/2022)  Financial Resource Strain: Low Risk  (06/20/2022)  Physical Activity: Insufficiently Active (06/20/2022)  Social Connections: Socially Integrated (06/20/2022)  Stress: No Stress Concern Present (06/20/2022)  Tobacco Use: Low Risk  (07/08/2022)    Readmission Risk Interventions     No data to display

## 2022-07-09 NOTE — Progress Notes (Signed)
Pt complained of cramping in abdomen but refused pain medication for the time being. BP elevated, other vitals stable. Pt was kept NPO.

## 2022-07-10 DIAGNOSIS — E7800 Pure hypercholesterolemia, unspecified: Secondary | ICD-10-CM | POA: Diagnosis not present

## 2022-07-10 DIAGNOSIS — E860 Dehydration: Secondary | ICD-10-CM | POA: Diagnosis not present

## 2022-07-10 DIAGNOSIS — K529 Noninfective gastroenteritis and colitis, unspecified: Secondary | ICD-10-CM

## 2022-07-10 DIAGNOSIS — J302 Other seasonal allergic rhinitis: Secondary | ICD-10-CM

## 2022-07-10 DIAGNOSIS — K56600 Partial intestinal obstruction, unspecified as to cause: Secondary | ICD-10-CM | POA: Diagnosis not present

## 2022-07-10 LAB — CBC
HCT: 44.6 % (ref 36.0–46.0)
Hemoglobin: 14.6 g/dL (ref 12.0–15.0)
MCH: 31.2 pg (ref 26.0–34.0)
MCHC: 32.7 g/dL (ref 30.0–36.0)
MCV: 95.3 fL (ref 80.0–100.0)
Platelets: 191 10*3/uL (ref 150–400)
RBC: 4.68 MIL/uL (ref 3.87–5.11)
RDW: 11.5 % (ref 11.5–15.5)
WBC: 5.9 10*3/uL (ref 4.0–10.5)
nRBC: 0 % (ref 0.0–0.2)

## 2022-07-10 LAB — BASIC METABOLIC PANEL
Anion gap: 8 (ref 5–15)
BUN: 20 mg/dL (ref 8–23)
CO2: 25 mmol/L (ref 22–32)
Calcium: 8.7 mg/dL — ABNORMAL LOW (ref 8.9–10.3)
Chloride: 106 mmol/L (ref 98–111)
Creatinine, Ser: 0.8 mg/dL (ref 0.44–1.00)
GFR, Estimated: >60 mL/min (ref >60)
Glucose, Bld: 107 mg/dL — ABNORMAL HIGH (ref 70–99)
Potassium: 3.7 mmol/L (ref 3.5–5.1)
Sodium: 139 mmol/L (ref 135–145)

## 2022-07-10 MED ORDER — LEVOCETIRIZINE DIHYDROCHLORIDE 5 MG PO TABS
5.0000 mg | ORAL_TABLET | Freq: Every evening | ORAL | Status: DC | PRN
Start: 2022-07-10 — End: 2022-08-08

## 2022-07-10 MED ORDER — ONDANSETRON 8 MG PO TBDP: 8.0000 mg | ORAL_TABLET | Freq: Three times a day (TID) | ORAL | 0 refills | Status: DC | PRN

## 2022-07-10 NOTE — Discharge Summary (Signed)
Physician Discharge Summary   Patient: Nichole Cunningham MRN: [id number] DOB: 1950-09-01  Admit date:     07/08/2022  Discharge date: 07/10/22  Discharge Physician: Vassie Loll   PCP: Raliegh Ip, DO   Recommendations at discharge:  Repeat basic metabolic panel to follow electrolytes and renal function Reassess complete resolution of patient GI upset symptoms; if needed palpitation to ongoing use of current hypoglycemic regimen as sometimes can be associated with these abnormalities. Continue to follow patient's LFTs and lipid panel.  Discharge Diagnoses: Principal Problem:   Partial small bowel obstruction (HCC) Active Problems:   Hypercholesterolemia   Gastroenteritis   Seasonal allergic rhinitis   Dehydration  Resolved Problems: Nausea, vomiting.  Brief Hospital admission narrative : Nichole Cunningham is a 72 y.o. female with medical history significant of cholecystectomy, hyperlipidemia, and more presents the ED with a chief complaint of food poisoning.  Patient reports that she had nausea, vomiting, diarrhea that started Friday night.  She thought it was food poisoning.  She did not have a fever.  There was no hematemesis, hematochezia, melena.  She did not have associated crampy abdominal pain, just reports that she felt upset from throwing up.  Patient reports her cholecystectomy was many years ago.  She has never had an SBO.  Since her symptoms started she has not attempted p.o. intake.  Her last meal was Friday at lunchtime.  Her last normal bowel movement was Thursday.  Since then she has had several watery bowel movements.  She still having flatulence.  Patient has no other complaints at this time.   Patient does not smoke, does not drink, does not use illicit drugs.  She is vaccinated for COVID and flu.  Patient is full code.  Assessment and Plan: * Partial small bowel obstruction (HCC) - CT abdomen shows partial SBO likely related to adhesions - Patient's symptoms  completely off with conservative management and at discharge no nausea, no vomiting and tolerating diet. -Case discussed with general surgery who feel chronic narrowing from previous surgical intervention but not needing any treatment at the moment -Continue to maintain adequate hydration, adequate fiber ingestion in her diet and as needed stool softener medications over-the-counter to facilitate good bowel regimen. -As needed antiemetics prescribed.  Hypercholesterolemia - Continue fenofibrate and statin -Healthy diet discussed with patient.  Type 2 diabetes -Stable and well-controlled -Continue home hypoglycemic regimen and close CBG monitoring -Modified carbohydrate discussed with patient.  Seasonal allergies -Continue the use of as needed levocetirizine  Dehydration -Resolved after fluid resuscitation provided -Patient at Discharge not longer Experiencing GI losses and tolerating diet.  Consultants: General surgery Procedures performed: See below for x-ray reports Disposition: Home Diet recommendation: Heart healthy and high-fiber water diet.   DISCHARGE MEDICATION: Allergies as of 07/10/2022   No Known Allergies      Medication List     TAKE these medications    augmented betamethasone dipropionate 0.05 % cream Commonly known as: DIPROLENE-AF Apply 1 Application topically 2 (two) times daily as needed (flare up of itching skin on legs).   Cholecalciferol 50 MCG (2000 UT) Tabs Take 1 tablet by mouth daily.   fenofibrate 160 MG tablet TAKE 1 TABLET (160 MG TOTAL) DAILY BY MOUTH. What changed:  how much to take how to take this when to take this additional instructions   Lancet Device Misc Test BGs once daily as needed E11.9 (one touch verio reflect)   levocetirizine 5 MG tablet Commonly known as: XYZAL Take 1 tablet (5  mg total) by mouth at bedtime as needed for allergies. For allergies/ drainage   OMEGA 3 PO Take 1 tablet by mouth daily.   ondansetron  8 MG disintegrating tablet Commonly known as: ZOFRAN-ODT Take 1 tablet (8 mg total) by mouth every 8 (eight) hours as needed for nausea or vomiting.   OneTouch Verio test strip Generic drug: glucose blood TEST BLOOD GLUCOSE DAILY AS NEEDED E11.9 (ONE TOUCH VERIO REFLECT)   Ozempic (0.25 or 0.5 MG/DOSE) 2 MG/3ML Sopn Generic drug: Semaglutide(0.25 or 0.5MG /DOS) Inject 0.5 mg into the skin every 7 (seven) days.   rosuvastatin 10 MG tablet Commonly known as: CRESTOR Take 1 tablet (10 mg total) by mouth daily.        Follow-up Information     Delynn Flavin M, DO. Schedule an appointment as soon as possible for a visit in 2 week(s).   Specialty: Family Medicine Contact information: [address] [place] [address] [phone]                Discharge Exam: Ceasar Mons Weights   07/08/22 1923 07/08/22 2345  Weight: 74.8 kg 74.3 kg   General exam: Alert, awake, oriented x 3 Respiratory system: Clear to auscultation. Respiratory effort normal. Cardiovascular system:RRR. No murmurs, rubs, gallops. Gastrointestinal system: Abdomen is nondistended, soft and nontender. No organomegaly or masses felt. Normal bowel sounds heard. Central nervous system: Alert and oriented. No focal neurological deficits. Extremities: No C/C/E, +pedal pulses Skin: No rashes, lesions or ulcers Psychiatry: Judgement and insight appear normal. Mood & affect appropriate.    Condition at discharge: Stable and improved.  The results of significant diagnostics from this hospitalization (including imaging, microbiology, ancillary and laboratory) are listed below for reference.   Imaging Studies: DG Abd 1 View  Result Date: 07/09/2022 CLINICAL DATA:  Small bowel obstruction EXAM: ABDOMEN - 1 VIEW COMPARISON:  Abdominal CT from yesterday FINDINGS: A loop of mildly dilated small bowel in the central abdomen is non progressed. Similar degree of diffuse colonic gas. No concerning mass effect or gas  collection. Cholecystectomy clips. IMPRESSION: Unchanged loop of mildly distended small bowel in the central abdomen. Electronically Signed   By: Tiburcio Pea M.D.   On: 07/09/2022 06:15   CT ABDOMEN PELVIS W CONTRAST  Result Date: 07/08/2022 CLINICAL DATA: Epigastric pain for several days EXAM: CT ABDOMEN AND PELVIS WITH CONTRAST TECHNIQUE: Multidetector CT imaging of the abdomen and pelvis was performed using the standard protocol following bolus administration of intravenous contrast. RADIATION DOSE REDUCTION: This exam was performed according to the departmental dose-optimization program which includes automated exposure control, adjustment of the mA and/or kV according to patient size and/or use of iterative reconstruction technique. CONTRAST:  OMNIPAQUE IOHEXOL 300 MG/ML  SOLN COMPARISON:  None Available. FINDINGS: Lower chest: No acute abnormality. Hepatobiliary: Fatty infiltration of the liver is noted. Gallbladder has been surgically removed. Pancreas: Unremarkable. No pancreatic ductal dilatation or surrounding inflammatory changes. Spleen: Normal in size without focal abnormality. Adrenals/Urinary Tract: Adrenal glands are within normal limits. Kidneys show normal enhancement pattern. No renal calculi or obstructive changes are noted. A few scattered small simple cysts are noted. No further follow-up is recommended. The bladder is well distended. Stomach/Bowel: No obstructive or inflammatory changes of the colon are noted. Appendix is not visualized and may have been surgically removed. No inflammatory changes to suggest appendicitis are noted. Stomach is within normal limits. Mild dilatation of the proximal ileum is noted with a transition zone identified in the right lower  quadrant on image number 57 of series 2. This may be related to adhesions as no focal lesion is noted. Or distal ileum appears within normal limits. Vascular/Lymphatic: Aortic atherosclerosis. No enlarged abdominal or  pelvic lymph nodes. Reproductive: Uterus and bilateral adnexa are unremarkable. Other: No abdominal wall hernia or abnormality. No abdominopelvic ascites. Musculoskeletal: No acute or significant osseous findings. IMPRESSION: Dilated loops of proximal ileum with a transition zone in the right lower quadrant several cm proximal to the terminal ileum. This is consistent with partial small bowel obstruction likely related to adhesions. No focal mass is noted. No other focal abnormality is noted. Electronically Signed   By: Alcide Clever M.D.   On: 07/08/2022 21:40    Microbiology: Results for orders placed or performed in visit on 05/15/22  Rapid Strep Screen ([facility] ONLY)     Status: None   Collection Time: 05/15/22 10:44 AM   Specimen: Other   Other  Result Value Ref Range Status   Strep Gp A Ag, IA W/Reflex Negative Negative Final  Culture, Group A Strep     Status: None   Collection Time: 05/15/22 10:44 AM   Other  Result Value Ref Range Status   Strep A Culture CANCELED      Comment: Test not performed  Result canceled by the ancillary.     Labs: CBC: Recent Labs  Lab 07/08/22 1933 07/09/22 0415 07/10/22 0418  WBC 9.8 6.4 5.9  NEUTROABS  --  3.6  --   HGB 17.5* 16.0* 14.6  HCT 52.5* 48.2* 44.6  MCV 93.9 95.6 95.3  PLT 226 199 191   Basic Metabolic Panel: Recent Labs  Lab 07/08/22 1933 07/09/22 0415 07/10/22 0418  NA 136 138 139  K 3.7 4.1 3.7  CL 101 104 106  CO2 24 25 25   GLUCOSE 180* 105* 107*  BUN 29* 23 20  CREATININE 1.04* 0.84 0.80  CALCIUM 9.4 8.9 8.7*  MG  --  2.2  --    Liver Function Tests: Recent Labs  Lab 07/08/22 1933 07/09/22 0415  AST 62* 40  ALT 67* 49*  ALKPHOS 70 58  BILITOT 1.6* 1.4*  PROT 7.9 6.8  ALBUMIN 4.6 3.9   CBG: No results for input(s): "GLUCAP" in the last 168 hours.  Discharge time spent: less than 30 minutes.  Signed: Vassie Loll, MD [facility] 07/10/2022

## 2022-07-10 NOTE — Progress Notes (Signed)
[facility] Progress Note     Subjective: Eating and having bms. No issues.   Objective: Vital signs in last 24 hours: Temp:  [98 F (36.7 C)-98.6 F (37 C)] 98.6 F (37 C) (05/14 0443) Pulse Rate:  [74-95] 74 (05/14 0443) Resp:  [18] 18 (05/14 0443) BP: (117-123)/(75-85) 120/75 (05/14 0443) SpO2:  [96 %] 96 % (05/14 0443) Last BM Date : 07/08/22  Intake/Output from previous day: 05/13 0701 - 05/14 0700 In: 1618.9 [P.O.:480; I.V.:1138.9] Out: -  Intake/Output this shift: Total I/O In: 240 [P.O.:240] Out: -   General appearance: alert and no distress GI: soft, nondistended, nontender   Lab Results:  Recent Labs    07/09/22 0415 07/10/22 0418  WBC 6.4 5.9  HGB 16.0* 14.6  HCT 48.2* 44.6  PLT 199 191   BMET Recent Labs    07/09/22 0415 07/10/22 0418  NA 138 139  K 4.1 3.7  CL 104 106  CO2 25 25  GLUCOSE 105* 107*  BUN 23 20  CREATININE 0.84 0.80  CALCIUM 8.9 8.7*   PT/INR No results for input(s): "LABPROT", "INR" in the last 72 hours.  Studies/Results: DG Abd 1 View  Result Date: 07/09/2022 CLINICAL DATA:  Small bowel obstruction EXAM: ABDOMEN - 1 VIEW COMPARISON:  Abdominal CT from yesterday FINDINGS: A loop of mildly dilated small bowel in the central abdomen is non progressed. Similar degree of diffuse colonic gas. No concerning mass effect or gas collection. Cholecystectomy clips. IMPRESSION: Unchanged loop of mildly distended small bowel in the central abdomen. Electronically Signed   By: Tiburcio Pea M.D.   On: 07/09/2022 06:15   CT ABDOMEN PELVIS W CONTRAST  Result Date: 07/08/2022 CLINICAL DATA: Epigastric pain for several days EXAM: CT ABDOMEN AND PELVIS WITH CONTRAST TECHNIQUE: Multidetector CT imaging of the abdomen and pelvis was performed using the standard protocol following bolus administration of intravenous contrast. RADIATION DOSE REDUCTION: This exam was performed according to the departmental dose-optimization  program which includes automated exposure control, adjustment of the mA and/or kV according to patient size and/or use of iterative reconstruction technique. CONTRAST:  OMNIPAQUE IOHEXOL 300 MG/ML  SOLN COMPARISON:  None Available. FINDINGS: Lower chest: No acute abnormality. Hepatobiliary: Fatty infiltration of the liver is noted. Gallbladder has been surgically removed. Pancreas: Unremarkable. No pancreatic ductal dilatation or surrounding inflammatory changes. Spleen: Normal in size without focal abnormality. Adrenals/Urinary Tract: Adrenal glands are within normal limits. Kidneys show normal enhancement pattern. No renal calculi or obstructive changes are noted. A few scattered small simple cysts are noted. No further follow-up is recommended. The bladder is well distended. Stomach/Bowel: No obstructive or inflammatory changes of the colon are noted. Appendix is not visualized and may have been surgically removed. No inflammatory changes to suggest appendicitis are noted. Stomach is within normal limits. Mild dilatation of the proximal ileum is noted with a transition zone identified in the right lower quadrant on image number 57 of series 2. This may be related to adhesions as no focal lesion is noted. Or distal ileum appears within normal limits. Vascular/Lymphatic: Aortic atherosclerosis. No enlarged abdominal or pelvic lymph nodes. Reproductive: Uterus and bilateral adnexa are unremarkable. Other: No abdominal wall hernia or abnormality. No abdominopelvic ascites. Musculoskeletal: No acute or significant osseous findings. IMPRESSION: Dilated loops of proximal ileum with a transition zone in the right lower quadrant several cm proximal to the terminal ileum. This is consistent with partial small bowel obstruction likely related to adhesions. No focal mass is  noted. No other focal abnormality is noted. Electronically Signed   By: Alcide Clever M.D.   On: 07/08/2022 21:40     Anti-infectives: Anti-infectives (From admission, onward)    None       Assessment/Plan: Patient with what I think was more likely gastroenteritis than pSBO. Doing well and tolerating diet and having Bms.   Dc home with PCP follow up Discussed if something similar happens in the future, this would be an event to recall as she could have had some degree of partial obstruction but with the symptom and family having same symptoms I do not think that was the case    LOS: 2 days    Nichole Cunningham 07/10/2022

## 2022-07-12 ENCOUNTER — Telehealth: Payer: Self-pay

## 2022-07-12 NOTE — Transitions of Care (Post Inpatient/ED Visit) (Signed)
   07/12/2022  Name: PALESTINE GOVAN MRN: [id number] DOB: May 30, 1950  Today's TOC FU Call Status: Today's TOC FU Call Status:: Unsuccessul Call (1st Attempt) Unsuccessful Call (1st Attempt) Date: 07/12/22  Attempted to reach the patient regarding the most recent Inpatient/ED visit.  Follow Up Plan: Additional outreach attempts will be made to reach the patient to complete the Transitions of Care (Post Inpatient/ED visit) call.   Jodelle Gross, RN, BSN, CCM Care Management Coordinator [facility] Phone: [phone]/Fax: [phone]

## 2022-07-16 ENCOUNTER — Telehealth: Payer: Self-pay

## 2022-07-16 NOTE — Transitions of Care (Post Inpatient/ED Visit) (Signed)
   07/16/2022  Name: Nichole Cunningham MRN: [id number] DOB: 1950-06-29  Today's TOC FU Call Status: Today's TOC FU Call Status:: Successful TOC FU Call Competed TOC FU Call Complete Date: 07/16/22  Transition Care Management Follow-up Telephone Call Date of Discharge: 07/10/22 [facility] Type of Discharge: Inpatient Admission Primary Inpatient Discharge Diagnosis:: Small Bowel Obstruction How have you been since you were released from the hospital?:  (Patient noted that she has improved with her bowels but she now has a cold.) Any questions or concerns?: No  Items Reviewed: Did you receive and understand the discharge instructions provided?: Yes Medications obtained,verified, and reconciled?: Yes (Medications Reviewed) Any new allergies since your discharge?: No Dietary orders reviewed?: Yes Type of Diet Ordered:: Heart Healthy, High fiber Do you have support at home?: Yes People in Home: spouse Name of Support/Comfort Primary Source: Leonette Most  Medications Reviewed Today: Medications Reviewed Today     Reviewed by Jodelle Gross, RN (Case Manager) on 07/16/22 at 1418  Med List Status: <None>   Medication Order Taking? Sig Documenting Provider Last Dose Status Informant  augmented betamethasone dipropionate (DIPROLENE-AF) 0.05 % cream [id number] No Apply 1 Application topically 2 (two) times daily as needed (flare up of itching skin on legs). [provider] Unknown Active Self  Cholecalciferol 50 MCG (2000 UT) TABS [id number] No Take 1 tablet by mouth daily. [provider] Unknown Active Self  fenofibrate 160 MG tablet [id number] Yes TAKE 1 TABLET (160 MG TOTAL) DAILY BY MOUTH.  Patient taking differently: Take 160 mg by mouth daily.   Delynn Flavin M, DO Taking Active Self  glucose blood [facility] VERIO) test strip [id number] Yes TEST BLOOD GLUCOSE DAILY AS NEEDED E11.9 (ONE TOUCH VERIO REFLECT) Raliegh Ip, DO Taking Active Self   Lancet Device MISC [id number] Yes Test BGs once daily as needed E11.9 (one touch verio reflect) Raliegh Ip, DO Taking Active Self  levocetirizine (XYZAL) 5 MG tablet [id number] Yes Take 1 tablet (5 mg total) by mouth at bedtime as needed for allergies. For allergies/ drainage Vassie Loll, MD Taking Active   Omega-3 Fatty Acids (OMEGA 3 PO) [id number] Yes Take 1 tablet by mouth daily. [provider] Taking Active Self  ondansetron (ZOFRAN-ODT) 8 MG disintegrating tablet [id number] Yes Take 1 tablet (8 mg total) by mouth every 8 (eight) hours as needed for nausea or vomiting. Vassie Loll, MD Taking Active   rosuvastatin (CRESTOR) 10 MG tablet [id number] Yes Take 1 tablet (10 mg total) by mouth daily. Raliegh Ip, DO Taking Active Self            Home Care and Equipment/Supplies: Were Home Health Services Ordered?: No Any new equipment or medical supplies ordered?: No  Functional Questionnaire: Do you need assistance with bathing/showering or dressing?: No Do you need assistance with meal preparation?: No Do you need assistance with eating?: No Do you have difficulty maintaining continence: No Do you need assistance with getting out of bed/getting out of a chair/moving?: No Do you have difficulty managing or taking your medications?: No  Follow up appointments reviewed: PCP Follow-up appointment confirmed?: Yes Date of PCP follow-up appointment?: 08/08/22 (Patient refused earlier appointment for HFU) Follow-up Provider: Dr. Nadine Counts Specialist [facility] Follow-up appointment confirmed?: NA Do you need transportation to your follow-up appointment?: No Do you understand care options if your condition(s) worsen?: Yes-patient verbalized understanding  Jodelle Gross, RN, BSN, CCM Care Management Coordinator [facility] Phone: [phone]/Fax: [phone]

## 2022-07-17 ENCOUNTER — Ambulatory Visit: Payer: Self-pay | Admitting: *Deleted

## 2022-07-17 NOTE — Chronic Care Management (AMB) (Signed)
   07/17/2022  Nichole Cunningham 1950/11/15 [id number]   Error- please disregard  Irving Shows RNC, BSN RN Case Manager [phone]

## 2022-08-02 ENCOUNTER — Other Ambulatory Visit: Payer: Self-pay | Admitting: *Deleted

## 2022-08-02 ENCOUNTER — Telehealth: Payer: Self-pay | Admitting: Family Medicine

## 2022-08-02 DIAGNOSIS — N1831 Chronic kidney disease, stage 3a: Secondary | ICD-10-CM

## 2022-08-02 DIAGNOSIS — E1165 Type 2 diabetes mellitus with hyperglycemia: Secondary | ICD-10-CM

## 2022-08-02 DIAGNOSIS — E1169 Type 2 diabetes mellitus with other specified complication: Secondary | ICD-10-CM

## 2022-08-02 NOTE — Telephone Encounter (Signed)
Labs ordered.

## 2022-08-04 IMAGING — DX DG LUMBAR SPINE 2-3V
2 series · 2 of 2 positions shown · non-contrast
Comparison: Lumbar radiograph 08/17/2013

CLINICAL DATA: Back pain. No additional history provided or
available.

EXAM:
LUMBAR SPINE - 2-3 VIEW

[l-spine ap]
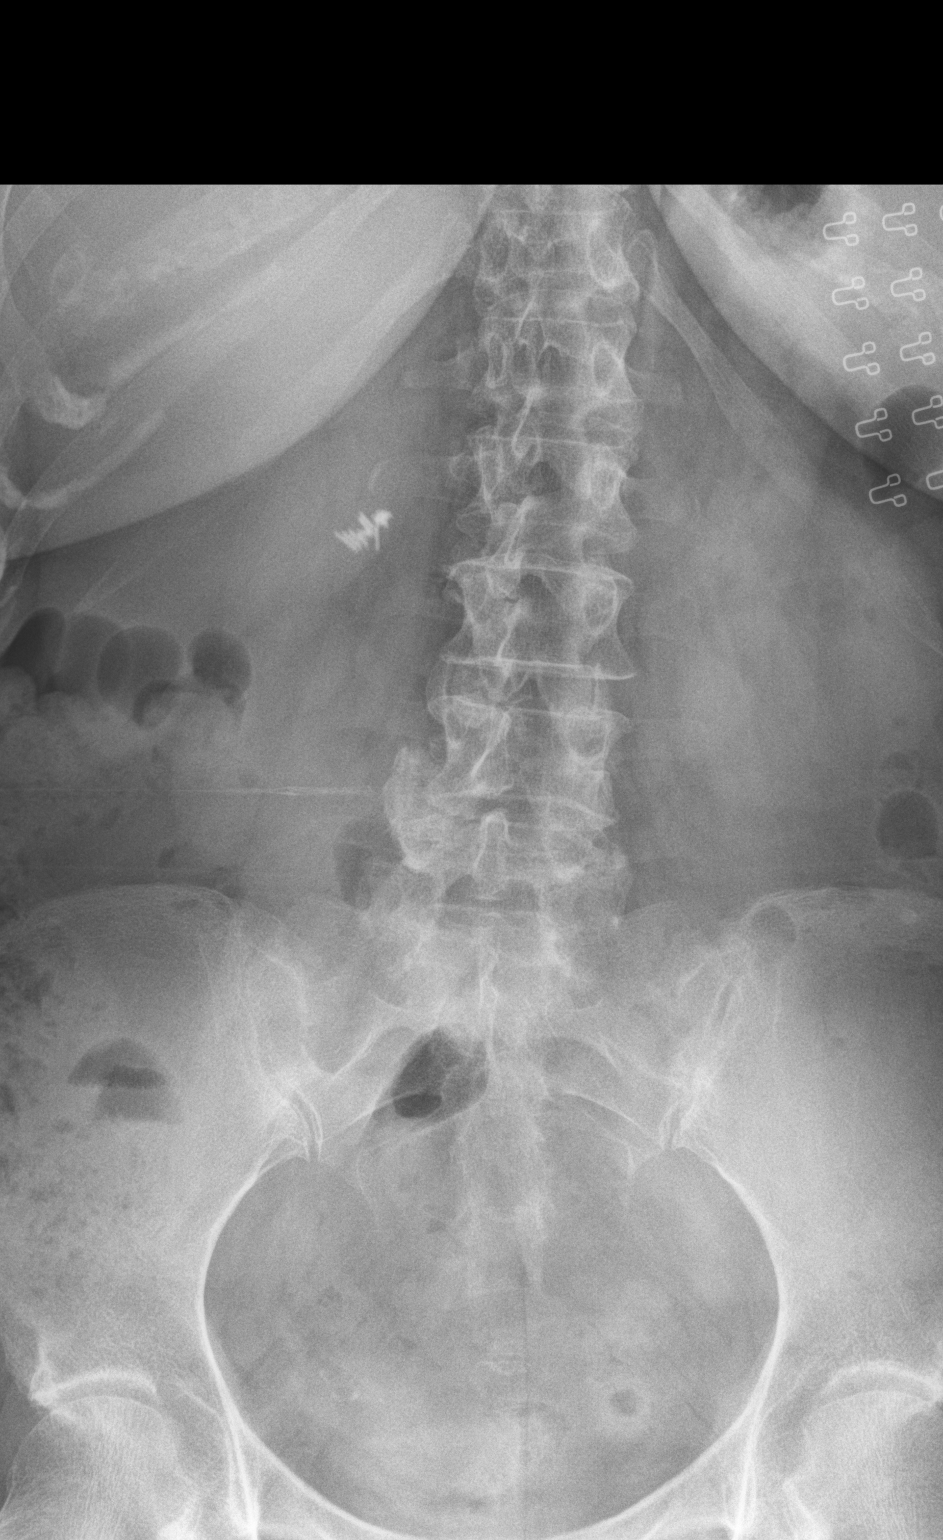

[l-spine lat]
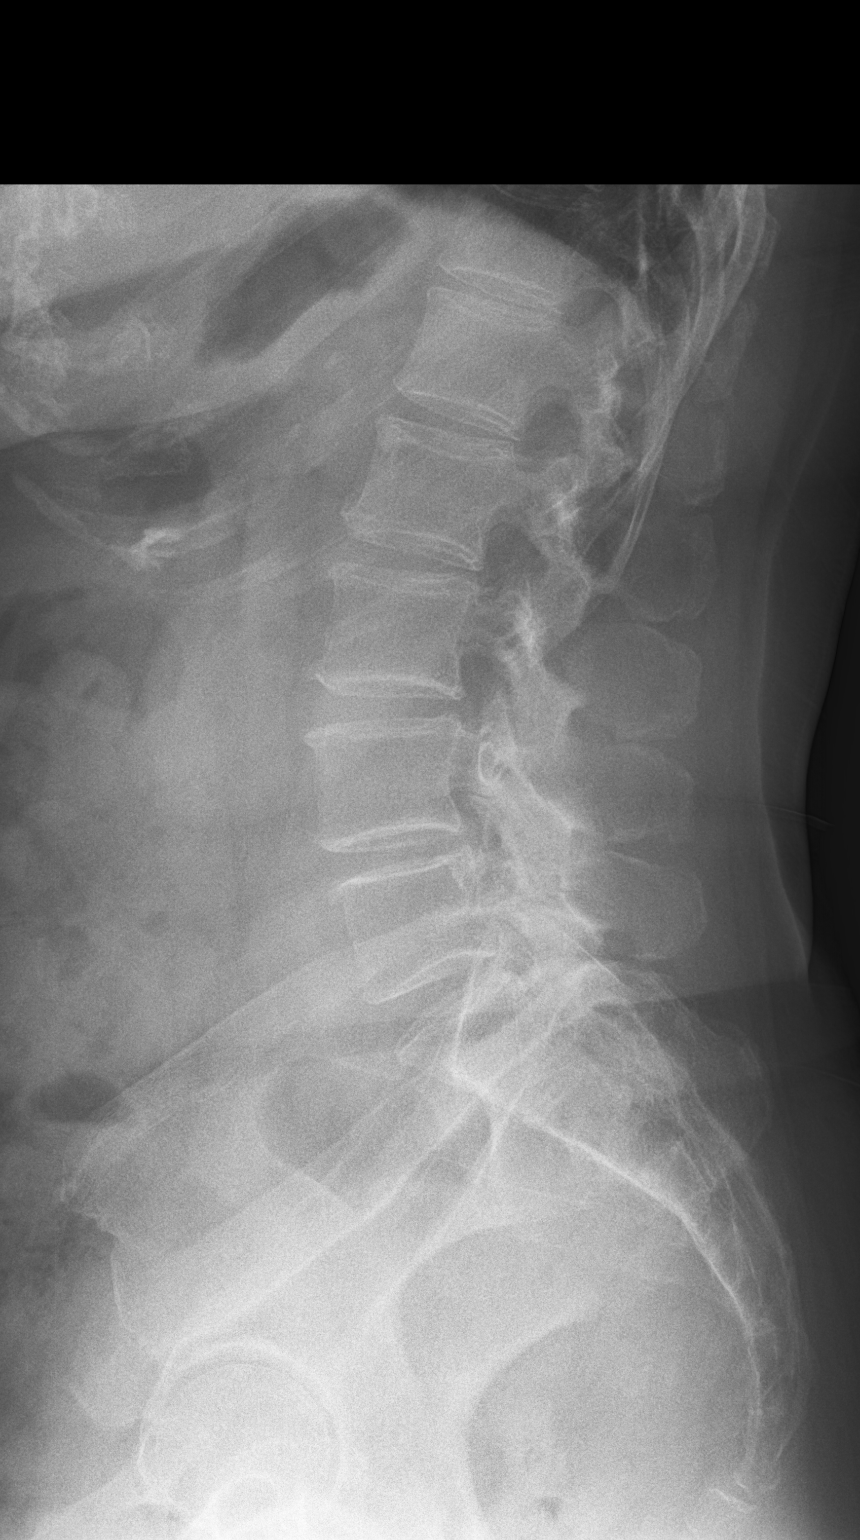

[2 of 2 positions shown; findings below may reference images not displayed]

FINDINGS: Upright AP and lateral views obtained. 5 non-rib-bearing lumbar
vertebra. Normal alignment. Vertebral body heights are normal. Mild
endplate spurring at L3-L4 and L4-L5, minimal disc space narrowing
at L4-L5. Mild facet changes L3-L4 through L5-S1, slightly
progressed at L5-S1 from prior exam. No evidence of fracture, focal
bone lesion or bony destruction. The sacroiliac joints are congruent
with mild degenerative change.
IMPRESSION: Mild degenerative disc disease and facet arthropathy of the lumbar
spine, slightly progressed from 4965 exam.

## 2022-08-06 ENCOUNTER — Other Ambulatory Visit: Payer: Medicare HMO

## 2022-08-06 ENCOUNTER — Other Ambulatory Visit: Payer: Self-pay | Admitting: Family Medicine

## 2022-08-06 DIAGNOSIS — E1169 Type 2 diabetes mellitus with other specified complication: Secondary | ICD-10-CM

## 2022-08-06 DIAGNOSIS — N1831 Chronic kidney disease, stage 3a: Secondary | ICD-10-CM | POA: Diagnosis not present

## 2022-08-06 DIAGNOSIS — E785 Hyperlipidemia, unspecified: Secondary | ICD-10-CM | POA: Diagnosis not present

## 2022-08-06 DIAGNOSIS — E1165 Type 2 diabetes mellitus with hyperglycemia: Secondary | ICD-10-CM | POA: Diagnosis not present

## 2022-08-06 LAB — CBC WITH DIFFERENTIAL/PLATELET
Basophils Absolute: 0 10*3/uL (ref 0.0–0.2)
Basos: 1 %
EOS (ABSOLUTE): 0.2 10*3/uL (ref 0.0–0.4)
Eos: 3 %
Hematocrit: 46.8 % — ABNORMAL HIGH (ref 34.0–46.6)
Hemoglobin: 15.5 g/dL (ref 11.1–15.9)
Immature Grans (Abs): 0 10*3/uL (ref 0.0–0.1)
Immature Granulocytes: 0 %
Lymphocytes Absolute: 2.7 10*3/uL (ref 0.7–3.1)
Lymphs: 36 %
MCH: 30.8 pg (ref 26.6–33.0)
MCHC: 33.1 g/dL (ref 31.5–35.7)
MCV: 93 fL (ref 79–97)
Monocytes Absolute: 0.5 10*3/uL (ref 0.1–0.9)
Monocytes: 7 %
Neutrophils Absolute: 3.9 10*3/uL (ref 1.4–7.0)
Neutrophils: 53 %
Platelets: 229 10*3/uL (ref 150–450)
RBC: 5.03 x10E6/uL (ref 3.77–5.28)
RDW: 12.4 % (ref 11.7–15.4)
WBC: 7.4 10*3/uL (ref 3.4–10.8)

## 2022-08-06 LAB — CMP14+EGFR
ALT: 27 IU/L (ref 0–32)
AST: 35 IU/L (ref 0–40)
Albumin/Globulin Ratio: 2.3
Albumin: 4.8 g/dL (ref 3.8–4.8)
Alkaline Phosphatase: 78 IU/L (ref 44–121)
BUN/Creatinine Ratio: 20 (ref 12–28)
BUN: 18 mg/dL (ref 8–27)
Bilirubin Total: 0.7 mg/dL (ref 0.0–1.2)
CO2: 20 mmol/L (ref 20–29)
Calcium: 10.3 mg/dL (ref 8.7–10.3)
Chloride: 104 mmol/L (ref 96–106)
Creatinine, Ser: 0.91 mg/dL (ref 0.57–1.00)
Globulin, Total: 2.1 g/dL (ref 1.5–4.5)
Glucose: 112 mg/dL — ABNORMAL HIGH (ref 70–99)
Potassium: 4.9 mmol/L (ref 3.5–5.2)
Sodium: 141 mmol/L (ref 134–144)
Total Protein: 6.9 g/dL (ref 6.0–8.5)
eGFR: 67 mL/min/{1.73_m2} (ref >59)

## 2022-08-06 LAB — LIPID PANEL
Chol/HDL Ratio: 3.7 ratio (ref 0.0–4.4)
Cholesterol, Total: 154 mg/dL (ref 100–199)
HDL: 42 mg/dL (ref >39)
LDL Chol Calc (NIH): 87 mg/dL (ref 0–99)
Triglycerides: 141 mg/dL (ref 0–149)
VLDL Cholesterol Cal: 25 mg/dL (ref 5–40)

## 2022-08-06 LAB — BAYER DCA HB A1C WAIVED: HB A1C (BAYER DCA - WAIVED): 6.7 % — ABNORMAL HIGH (ref 4.8–5.6)

## 2022-08-08 ENCOUNTER — Ambulatory Visit (INDEPENDENT_AMBULATORY_CARE_PROVIDER_SITE_OTHER): Payer: Medicare HMO | Admitting: Family Medicine

## 2022-08-08 ENCOUNTER — Encounter: Payer: Self-pay | Admitting: Family Medicine

## 2022-08-08 VITALS — BP 124/82 | HR 92 | Temp 98.8°F | Ht 62.0 in | Wt 159.0 lb

## 2022-08-08 DIAGNOSIS — J302 Other seasonal allergic rhinitis: Secondary | ICD-10-CM | POA: Diagnosis not present

## 2022-08-08 DIAGNOSIS — E1169 Type 2 diabetes mellitus with other specified complication: Secondary | ICD-10-CM | POA: Diagnosis not present

## 2022-08-08 DIAGNOSIS — E119 Type 2 diabetes mellitus without complications: Secondary | ICD-10-CM

## 2022-08-08 DIAGNOSIS — E785 Hyperlipidemia, unspecified: Secondary | ICD-10-CM

## 2022-08-08 MED ORDER — LEVOCETIRIZINE DIHYDROCHLORIDE 5 MG PO TABS
5.0000 mg | ORAL_TABLET | Freq: Every evening | ORAL | 3 refills | Status: DC | PRN
Start: 2022-08-08 — End: 2023-09-09

## 2022-08-08 NOTE — Progress Notes (Signed)
Subjective: CC:DM PCP: Raliegh Ip, DO ZOX:WRUEA Nichole Cunningham is a 72 y.o. female presenting to clinic today for:  1. Type 2 Diabetes with hyperlipidemia:  Diabetes has been diet controlled.  She had quite a rise in her A1c last visit at over 8.  She really has not made any significant changes to her diet but is happy to hear that her blood sugar is back in normal range.  She does not exercise.  She is compliant with her cholesterol medication.  Last eye exam: ROI completed Last foot exam: UTD Last A1c:  Lab Results  Component Value Date   HGBA1C 6.7 (H) 08/06/2022   Nephropathy screen indicated?: UTD Last flu, zoster and/or pneumovax:  Immunization History  Administered Date(s) Administered   Fluad Quad(high Dose 65+) 02/10/2021   Moderna SARS-COV2 Booster Vaccination 05/20/2020   Moderna Sars-Covid-2 Vaccination 04/24/2019, 05/27/2019   Pneumococcal Conjugate-13 01/07/2017   Pneumococcal Polysaccharide-23 04/07/2018   Td 05/30/1999   Zoster Recombinat (Shingrix) 11/06/2021    ROS: Denies dizziness, LOC, polyuria, polydipsia, unintended weight loss/gain, foot ulcerations, numbness or tingling in extremities, shortness of breath or chest pain.  She reports some rhinorrhea and intermittent coughing.  No fevers.  She reports drainage that seems to precipitate the cough but is not taking her allergy pill  ROS: Per HPI  No Known Allergies Past Medical History:  Diagnosis Date   Complication of anesthesia    Hyperlipidemia    PONV (postoperative nausea and vomiting)     Current Outpatient Medications:    augmented betamethasone dipropionate (DIPROLENE-AF) 0.05 % cream, Apply 1 Application topically 2 (two) times daily as needed (flare up of itching skin on legs)., Disp: , Rfl:    Cholecalciferol 50 MCG (2000 UT) TABS, Take 1 tablet by mouth daily., Disp: , Rfl:    fenofibrate 160 MG tablet, TAKE 1 TABLET (160 MG TOTAL) DAILY BY MOUTH. (Patient taking differently: Take  160 mg by mouth daily.), Disp: 90 tablet, Rfl: 3   glucose blood (ONETOUCH VERIO) test strip, TEST BLOOD GLUCOSE DAILY AS NEEDED E11.9 (ONE TOUCH VERIO REFLECT), Disp: 100 strip, Rfl: 3   Lancet Device MISC, Test BGs once daily as needed E11.9 (one touch verio reflect), Disp: 100 each, Rfl: 12   levocetirizine (XYZAL) 5 MG tablet, Take 1 tablet (5 mg total) by mouth at bedtime as needed for allergies. For allergies/ drainage, Disp: , Rfl:    Omega-3 Fatty Acids (OMEGA 3 PO), Take 1 tablet by mouth daily., Disp: , Rfl:    ondansetron (ZOFRAN-ODT) 8 MG disintegrating tablet, Take 1 tablet (8 mg total) by mouth every 8 (eight) hours as needed for nausea or vomiting., Disp: 20 tablet, Rfl: 0   rosuvastatin (CRESTOR) 10 MG tablet, Take 1 tablet (10 mg total) by mouth daily., Disp: 90 tablet, Rfl: 3 Social History   Socioeconomic History   Marital status: Married    Spouse name: Charles   Number of children: 2   Years of education: 12   Highest education level: High school graduate  Occupational History   Occupation: retired  Tobacco Use   Smoking status: Never   Smokeless tobacco: Never  Vaping Use   Vaping Use: Never used  Substance and Sexual Activity   Alcohol use: No   Drug use: No   Sexual activity: Yes    Birth control/protection: Post-menopausal  Other Topics Concern   Not on file  Social History Narrative   Not on file   Social Determinants of Health  Financial Resource Strain: Low Risk  (06/20/2022)   Overall Financial Resource Strain (CARDIA)    Difficulty of Paying Living Expenses: Not hard at all  Food Insecurity: No Food Insecurity (07/08/2022)   Hunger Vital Sign    Worried About Running Out of Food in the Last Year: Never true    Ran Out of Food in the Last Year: Never true  Transportation Needs: No Transportation Needs (07/08/2022)   PRAPARE - Administrator, Civil Service (Medical): No    Lack of Transportation (Non-Medical): No  Physical Activity:  Insufficiently Active (06/20/2022)   Exercise Vital Sign    Days of Exercise per Week: 3 days    Minutes of Exercise per Session: 30 min  Stress: No Stress Concern Present (06/20/2022)   [employer] of Occupational Health - Occupational Stress Questionnaire    Feeling of Stress : Not at all  Social Connections: Socially Integrated (06/20/2022)   Social Connection and Isolation Panel [NHANES]    Frequency of Communication with Friends and Family: More than three times a week    Frequency of Social Gatherings with Friends and Family: More than three times a week    Attends Religious Services: More than 4 times per year    Active Member of [employer] or Organizations: Yes    Attends Engineer, structural: More than 4 times per year    Marital Status: Married  Catering manager Violence: Not At Risk (07/08/2022)   Humiliation, Afraid, Rape, and Kick questionnaire    Fear of Current or Ex-Partner: No    Emotionally Abused: No    Physically Abused: No    Sexually Abused: No   Family History  Problem Relation Age of Onset   Breast cancer Maternal Aunt    Cancer Mother    Heart disease Father    Heart attack Father        cause of death   Cancer Sister    Colon cancer Sister        dx 2020   Cancer Brother        metastatic w/ uncertain primary dx 2020   Anesthesia problems Neg Hx    Hypotension Neg Hx    Malignant hyperthermia Neg Hx    Pseudochol deficiency Neg Hx    Stomach cancer Neg Hx     Objective: Office vital signs reviewed. BP 124/82   Pulse 92   Temp 98.8 F (37.1 C)   Ht 5\' 2"  (1.575 m)   Wt 159 lb (72.1 kg)   SpO2 96%   BMI 29.08 kg/m   Physical Examination:  General: Awake, alert, well nourished, No acute distress HEENT: Sclera white.  No gross rhinorrhea.  Moist mucous membranes Cardio: regular rate and rhythm, S1S2 heard, no murmurs appreciated Pulm: clear to auscultation bilaterally, no wheezes, rhonchi or rales; normal work of breathing on room  air.  She does clear her throat and cough intermittently towards the end of the visit  Results for orders placed or performed in visit on 08/06/22 (from the past 72 hour(s))  CMP14+EGFR     Status: Abnormal   Collection Time: 08/06/22  9:40 AM  Result Value Ref Range   Glucose 112 (H) 70 - 99 mg/dL   BUN 18 8 - 27 mg/dL   Creatinine, Ser 9.14 0.57 - 1.00 mg/dL   eGFR 67 >78 GN/FAO/1.30   BUN/Creatinine Ratio 20 12 - 28   Sodium 141 134 - 144 mmol/L   Potassium 4.9 3.5 -  5.2 mmol/L   Chloride 104 96 - 106 mmol/L   CO2 20 20 - 29 mmol/L   Calcium 10.3 8.7 - 10.3 mg/dL   Total Protein 6.9 6.0 - 8.5 g/dL   Albumin 4.8 3.8 - 4.8 g/dL   Globulin, Total 2.1 1.5 - 4.5 g/dL   Albumin/Globulin Ratio 2.3    Bilirubin Total 0.7 0.0 - 1.2 mg/dL   Alkaline Phosphatase 78 44 - 121 IU/L   AST 35 0 - 40 IU/L   ALT 27 0 - 32 IU/L  CBC with Differential/Platelet     Status: Abnormal   Collection Time: 08/06/22  9:40 AM  Result Value Ref Range   WBC 7.4 3.4 - 10.8 x10E3/uL   RBC 5.03 3.77 - 5.28 x10E6/uL   Hemoglobin 15.5 11.1 - 15.9 g/dL   Hematocrit 16.1 (H) 09.6 - 46.6 %   MCV 93 79 - 97 fL   MCH 30.8 26.6 - 33.0 pg   MCHC 33.1 31.5 - 35.7 g/dL   RDW 04.5 40.9 - 81.1 %   Platelets 229 150 - 450 x10E3/uL   Neutrophils 53 Not Estab. %   Lymphs 36 Not Estab. %   Monocytes 7 Not Estab. %   Eos 3 Not Estab. %   Basos 1 Not Estab. %   Neutrophils Absolute 3.9 1.4 - 7.0 x10E3/uL   Lymphocytes Absolute 2.7 0.7 - 3.1 x10E3/uL   Monocytes Absolute 0.5 0.1 - 0.9 x10E3/uL   EOS (ABSOLUTE) 0.2 0.0 - 0.4 x10E3/uL   Basophils Absolute 0.0 0.0 - 0.2 x10E3/uL   Immature Granulocytes 0 Not Estab. %   Immature Grans (Abs) 0.0 0.0 - 0.1 x10E3/uL  Lipid panel     Status: None   Collection Time: 08/06/22  9:40 AM  Result Value Ref Range   Cholesterol, Total 154 100 - 199 mg/dL   Triglycerides 914 0 - 149 mg/dL   HDL 42 >78 mg/dL   VLDL Cholesterol Cal 25 5 - 40 mg/dL   LDL Chol Calc (NIH) 87 0 - 99  mg/dL   LDL CALC COMMENT: CANCELED     Comment: Test not performed  Result canceled by the ancillary.    Chol/HDL Ratio 3.7 0.0 - 4.4 ratio    Comment:                                   T. Chol/HDL Ratio                                             Men  Women                               1/2 Avg.Risk  3.4    3.3                                   Avg.Risk  5.0    4.4                                2X Avg.Risk  9.6    7.1  3X Avg.Risk 23.4   11.0      Assessment/ Plan: 72 y.o. female   Diet-controlled diabetes mellitus (HCC)  Hyperlipidemia associated with type 2 diabetes mellitus (HCC)  Seasonal allergic rhinitis, unspecified trigger - Plan: levocetirizine (XYZAL) 5 MG tablet  A1c at goal at 6.7.  She is doing a good job with controlling this through diet alone.  Renal function appears to be within normal range now.  Continue statin for cholesterol control  Xyzal renewed for allergic rhinitis.  Discussed use of this at night to prevent drainage   No orders of the defined types were placed in this encounter.  No orders of the defined types were placed in this encounter.    Raliegh Ip, DO [facility] [phone]

## 2022-08-08 NOTE — Patient Instructions (Signed)
Call to make your colon appointment: [facility] at [facility] [address] [place],  [place]  [address] Main: [phone]

## 2022-08-10 ENCOUNTER — Ambulatory Visit: Payer: Medicare HMO | Admitting: Family Medicine

## 2022-08-15 ENCOUNTER — Telehealth: Payer: Self-pay | Admitting: Family Medicine

## 2022-08-15 NOTE — Telephone Encounter (Signed)
Left message for patient to call back to schedule diabetic retinal exam with new camera we have here at the office.

## 2022-09-10 ENCOUNTER — Telehealth: Payer: Self-pay | Admitting: Family Medicine

## 2022-09-10 DIAGNOSIS — E1165 Type 2 diabetes mellitus with hyperglycemia: Secondary | ICD-10-CM

## 2022-09-10 MED ORDER — SITAGLIPTIN PHOSPHATE 50 MG PO TABS
50.0000 mg | ORAL_TABLET | Freq: Every day | ORAL | 3 refills | Status: DC
Start: 2022-09-10 — End: 2022-12-12

## 2022-09-10 NOTE — Telephone Encounter (Signed)
After further review, looks like it may have been discontinued at discharge from hospital for SBO.  I agree GLP/ GIP likely not a good option for her.  Will order Januvia 50mg  to start.  May increase pending A1c at next visit.  Meds ordered this encounter  Medications   sitaGLIPtin (JANUVIA) 50 MG tablet    Sig: Take 1 tablet (50 mg total) by mouth daily. For diabetes    Dispense:  90 tablet    Refill:  3

## 2022-09-10 NOTE — Addendum Note (Signed)
Addended by: Raliegh Ip on: 09/10/2022 05:26 PM   Modules accepted: Orders

## 2022-09-10 NOTE — Telephone Encounter (Signed)
Patient said Ozempic is too expensive and wants to know if anything else can be called in. Please call back and advise.

## 2022-09-10 NOTE — Telephone Encounter (Signed)
Pt has been notified - states she is fine with going on something else she is fine with it not helping with her weight

## 2022-09-10 NOTE — Telephone Encounter (Signed)
I'm confused.  I don't have her on ozempic?  Her sugar has been diet controlled.

## 2022-09-11 ENCOUNTER — Other Ambulatory Visit: Payer: Self-pay | Admitting: Family Medicine

## 2022-09-11 DIAGNOSIS — Z1231 Encounter for screening mammogram for malignant neoplasm of breast: Secondary | ICD-10-CM

## 2022-09-17 ENCOUNTER — Telehealth: Payer: Self-pay | Admitting: Family Medicine

## 2022-09-17 NOTE — Telephone Encounter (Signed)
Pt says that when she was at [facility] hospital a few months ago, she was told to stop taking ozempic and that something else would be called in.  Pt says that she went to hospital for vomitting and diarrhea and thought the sxs were from taking ozempic.  Is something else going to be called in? Pt says that she does not want to take ozempic no more because she can no longer afford it. Please call back

## 2022-09-17 NOTE — Telephone Encounter (Signed)
Patient aware and verbalizes understanding. 

## 2022-09-17 NOTE — Telephone Encounter (Signed)
I'm very confused.  I just saw her in June. Her sugar was diet controlled. She does not need additional sugar medication at this time.  We dc'd the ozempic.

## 2022-09-19 ENCOUNTER — Ambulatory Visit
Admission: RE | Admit: 2022-09-19 | Discharge: 2022-09-19 | Disposition: A | Discharge status: Home / Self Care | Payer: Medicare HMO | Source: Ambulatory Visit | Attending: Family Medicine | Admitting: Family Medicine

## 2022-09-19 DIAGNOSIS — Z1231 Encounter for screening mammogram for malignant neoplasm of breast: Secondary | ICD-10-CM | POA: Diagnosis not present

## 2022-11-19 DIAGNOSIS — L820 Inflamed seborrheic keratosis: Secondary | ICD-10-CM | POA: Diagnosis not present

## 2022-11-19 DIAGNOSIS — L738 Other specified follicular disorders: Secondary | ICD-10-CM | POA: Diagnosis not present

## 2022-11-19 DIAGNOSIS — L308 Other specified dermatitis: Secondary | ICD-10-CM | POA: Diagnosis not present

## 2022-11-30 ENCOUNTER — Other Ambulatory Visit: Payer: Self-pay | Admitting: Family Medicine

## 2022-11-30 DIAGNOSIS — Z1211 Encounter for screening for malignant neoplasm of colon: Secondary | ICD-10-CM

## 2022-11-30 DIAGNOSIS — Z1212 Encounter for screening for malignant neoplasm of rectum: Secondary | ICD-10-CM

## 2022-12-10 ENCOUNTER — Other Ambulatory Visit: Payer: Medicare HMO

## 2022-12-10 ENCOUNTER — Other Ambulatory Visit: Payer: Self-pay | Admitting: Family Medicine

## 2022-12-10 DIAGNOSIS — E1165 Type 2 diabetes mellitus with hyperglycemia: Secondary | ICD-10-CM

## 2022-12-10 LAB — BAYER DCA HB A1C WAIVED: HB A1C (BAYER DCA - WAIVED): 7.1 % — ABNORMAL HIGH (ref 4.8–5.6)

## 2022-12-11 LAB — MICROALBUMIN / CREATININE URINE RATIO
Creatinine, Urine: 99.2 mg/dL
Microalb/Creat Ratio: 5 mg/g{creat} (ref 0–29)
Microalbumin, Urine: 4.6 ug/mL

## 2022-12-12 ENCOUNTER — Ambulatory Visit: Payer: Medicare HMO | Admitting: Family Medicine

## 2022-12-12 ENCOUNTER — Encounter: Payer: Self-pay | Admitting: Family Medicine

## 2022-12-12 VITALS — BP 135/84 | HR 84 | Temp 98.4°F | Ht 62.0 in | Wt 168.0 lb

## 2022-12-12 DIAGNOSIS — Z7984 Long term (current) use of oral hypoglycemic drugs: Secondary | ICD-10-CM

## 2022-12-12 DIAGNOSIS — E785 Hyperlipidemia, unspecified: Secondary | ICD-10-CM

## 2022-12-12 DIAGNOSIS — Z7985 Long-term (current) use of injectable non-insulin antidiabetic drugs: Secondary | ICD-10-CM | POA: Insufficient documentation

## 2022-12-12 DIAGNOSIS — M7661 Achilles tendinitis, right leg: Secondary | ICD-10-CM | POA: Diagnosis not present

## 2022-12-12 DIAGNOSIS — M5431 Sciatica, right side: Secondary | ICD-10-CM | POA: Diagnosis not present

## 2022-12-12 DIAGNOSIS — E119 Type 2 diabetes mellitus without complications: Secondary | ICD-10-CM | POA: Insufficient documentation

## 2022-12-12 DIAGNOSIS — E1169 Type 2 diabetes mellitus with other specified complication: Secondary | ICD-10-CM | POA: Diagnosis not present

## 2022-12-12 MED ORDER — SITAGLIPTIN PHOSPHATE 100 MG PO TABS
100.0000 mg | ORAL_TABLET | Freq: Every day | ORAL | 3 refills | Status: DC
Start: 2022-12-12 — End: 2023-04-15

## 2022-12-12 MED ORDER — MELOXICAM 7.5 MG PO TABS
7.5000 mg | ORAL_TABLET | Freq: Every day | ORAL | 0 refills | Status: DC | PRN
Start: 2022-12-12 — End: 2023-07-15

## 2022-12-12 MED ORDER — TIZANIDINE HCL 4 MG PO TABS
4.0000 mg | ORAL_TABLET | Freq: Three times a day (TID) | ORAL | 0 refills | Status: AC | PRN
Start: 2022-12-12 — End: ?

## 2022-12-12 MED ORDER — GABAPENTIN 300 MG PO CAPS
300.0000 mg | ORAL_CAPSULE | Freq: Every evening | ORAL | 0 refills | Status: DC | PRN
Start: 2022-12-12 — End: 2023-07-15

## 2022-12-12 NOTE — Patient Instructions (Signed)
Double up on your Januvia at home to equal 100mg  daily. I've added the following for your pain: Gabapentin for burning pain at night. This can cause sleepiness Tizanidine for muscle spasms. Can cause sleepiness. Ok to break in half if needed Meloxicam to help with foot/ leg pain. Take with food. NO ibuprofen/ aleve, etc. ONLY tylenol if needed with this one.  Achilles Tendinitis  Achilles tendinitis is inflammation of the tough, cord-like band that connects the lower leg muscles to the heel bone (Achilles tendon). This is often caused by using the tendon and ankle joint too much. In most cases, Achilles tendinitis gets better over time with treatment and home care. It can take weeks or months to fully heal. What are the causes? This condition may be caused by: A sudden increase in exercise or activity, such as running. Doing the same exercises or activities, such as jumping, over and over. Not warming up your calf muscles before you exercise. Exercising in shoes that are worn out or not made for exercise. Having arthritis or a bone growth (spur) on the back of your heel. This can rub against the tendon and hurt it. Age-related wear and tear. Tendons become less flexible with age and are more likely to be injured. What are the signs or symptoms? Common symptoms of this condition include: Pain in your Achilles tendon or in the back of your leg, just above your heel. The pain may get worse when you exercise. Stiffness or soreness in the back of your leg. You may feel it most often in the morning. Swelling of the skin over the Achilles tendon. Thickening of the tendon. Trouble standing on tiptoe. How is this diagnosed? This condition is diagnosed based on your symptoms and a physical exam. You may also have tests, such as: X-rays. MRI. Ultrasound. How is this treated? The goal of treatment is to relieve symptoms and help your injury heal. You may need to: Decrease or stop activities that  caused the tendinitis. You may be told to switch to low-impact exercises like biking or swimming. Ice the injured area. Do physical therapy. This may include strengthening and stretching exercises. Take NSAIDs, such as ibuprofen. These can help with pain and swelling. Use supportive shoes, wraps, heel lifts, or a walking boot (air cast). Have surgery. This may be done if your symptoms do not get better with other treatments. Use high-energy waves to start the healing process (extracorporeal shock wave therapy). This is rare. Get an injection of medicines that help with inflammation (corticosteroids). This is rare. Follow these instructions at home: If you have an air cast: Wear the air cast as told by your health care provider. Remove it only as told by your provider. Check the skin around the air cast every day. Tell your provider about any concerns. Loosen the air cast if your toes tingle, become numb, or turn cold and blue. Keep the air cast clean. If the air cast is not waterproof: Do not let it get wet. Cover it with a watertight covering when you take a bath or shower. Managing pain, stiffness, and swelling  If told, put ice on the injured area. If you have a removable air cast, remove it as told by your provider. Put ice in a plastic bag. Place a towel between your skin and the bag. Leave the ice on for 20 minutes, 2-3 times a day. If your skin turns bright red, remove the ice right away to prevent skin damage. The risk of  damage is higher if you cannot feel pain, heat, or cold. Move your toes often to reduce stiffness and swelling. Raise (elevate) your foot above the level of your heart while you are sitting or lying down. Activity Do not do activities that cause pain. Ask your provider when it is safe to drive if you have an air cast on your foot. If you go to physical therapy, do exercises as told by your provider or therapist. Return to your normal activities as told by your  provider. Ask your provider what activities are safe for you. General instructions Take over-the-counter and prescription medicines only as told by your provider. If told, wrap your foot with an elastic bandage or other wrap. This can help to keep your tendon from moving too much while it heals. Your provider will show you how to wrap your foot. Wear supportive shoes or heel lifts only as told by your provider. Contact a health care provider if: Your symptoms get worse. Your pain does not get better with medicine. You have new symptoms that you cannot explain. You have warmth and swelling in your foot. You have a fever. Get help right away if: You hear a sudden popping sound in your Achilles tendon and then have severe pain. You cannot move your toes or foot. You cannot put any weight on your foot. Your foot or toes become numb and look white or blue even after you loosen your bandage or air cast. This information is not intended to replace advice given to you by your health care provider. Make sure you discuss any questions you have with your health care provider. Document Revised: 11/03/2021 Document Reviewed: 11/03/2021 Elsevier Patient Education  2024 [employer].

## 2022-12-12 NOTE — Progress Notes (Signed)
Subjective: CC:DM PCP: Raliegh Ip, DO XLK:GMWNU Nichole Cunningham is a 72 y.o. female presenting to clinic today for:  1. Type 2 Diabetes with hyperlipidemia:  Admits that she is not really following a strict diet.  She is compliant with her Crestor, Januvia and fenofibrate.  Her blood pressure has been diet controlled.  Diabetes Health Maintenance Due  Topic Date Due   OPHTHALMOLOGY EXAM  05/03/2022   FOOT EXAM  11/07/2022   HEMOGLOBIN A1C  06/10/2023    Last A1c:  Lab Results  Component Value Date   HGBA1C 7.1 (H) 12/10/2022    ROS: Denies dizziness, LOC, polyuria, polydipsia, unintended weight loss/gain, foot ulcerations, numbness or tingling in extremities, shortness of breath or chest pain.  2.  Heel and leg pain Patient reports about a 97-month history of right sided leg pain.  She notes it starts in her buttock and kind of radiates down that right thigh.  As of late she has been having posterior right sided heel pain and she notes some soft tissue swelling around that area.  Has not had any success with oral analgesics.  Denies any preceding injury   ROS: Per HPI  No Known Allergies Past Medical History:  Diagnosis Date   Complication of anesthesia    Hyperlipidemia    Partial small bowel obstruction (HCC) 07/08/2022   PONV (postoperative nausea and vomiting)     Current Outpatient Medications:    augmented betamethasone dipropionate (DIPROLENE-AF) 0.05 % cream, Apply 1 Application topically 2 (two) times daily as needed (flare up of itching skin on legs)., Disp: , Rfl:    Cholecalciferol 50 MCG (2000 UT) TABS, Take 1 tablet by mouth daily., Disp: , Rfl:    fenofibrate 160 MG tablet, TAKE 1 TABLET (160 MG TOTAL) DAILY BY MOUTH. (Patient taking differently: Take 160 mg by mouth daily.), Disp: 90 tablet, Rfl: 3   gabapentin (NEURONTIN) 300 MG capsule, Take 1 capsule (300 mg total) by mouth at bedtime as needed (burning leg pain at night time)., Disp: 30 capsule, Rfl:  0   glucose blood (ONETOUCH VERIO) test strip, TEST BLOOD GLUCOSE DAILY AS NEEDED E11.9 (ONE TOUCH VERIO REFLECT), Disp: 100 strip, Rfl: 3   Lancet Device MISC, Test BGs once daily as needed E11.9 (one touch verio reflect), Disp: 100 each, Rfl: 12   levocetirizine (XYZAL) 5 MG tablet, Take 1 tablet (5 mg total) by mouth at bedtime as needed for allergies. For allergies/ drainage, Disp: 90 tablet, Rfl: 3   meloxicam (MOBIC) 7.5 MG tablet, Take 1 tablet (7.5 mg total) by mouth daily as needed for pain (take with food.)., Disp: 30 tablet, Rfl: 0   Omega-3 Fatty Acids (OMEGA 3 PO), Take 1 tablet by mouth daily., Disp: , Rfl:    rosuvastatin (CRESTOR) 10 MG tablet, Take 1 tablet (10 mg total) by mouth daily., Disp: 90 tablet, Rfl: 3   tiZANidine (ZANAFLEX) 4 MG tablet, Take 1 tablet (4 mg total) by mouth every 8 (eight) hours as needed for muscle spasms., Disp: 30 tablet, Rfl: 0   sitaGLIPtin (JANUVIA) 100 MG tablet, Take 1 tablet (100 mg total) by mouth daily. For diabetes, Disp: 90 tablet, Rfl: 3 Social History   Socioeconomic History   Marital status: Married    Spouse name: Leonette Most   Number of children: 2   Years of education: 12   Highest education level: High school graduate  Occupational History   Occupation: retired  Tobacco Use   Smoking status: Never  Smokeless tobacco: Never  Vaping Use   Vaping status: Never Used  Substance and Sexual Activity   Alcohol use: No   Drug use: No   Sexual activity: Yes    Birth control/protection: Post-menopausal  Other Topics Concern   Not on file  Social History Narrative   Not on file   Social Determinants of Health   Financial Resource Strain: Low Risk  (06/20/2022)   Overall Financial Resource Strain (CARDIA)    Difficulty of Paying Living Expenses: Not hard at all  Food Insecurity: No Food Insecurity (07/08/2022)   Hunger Vital Sign    Worried About Running Out of Food in the Last Year: Never true    Ran Out of Food in the Last Year:  Never true  Transportation Needs: No Transportation Needs (07/08/2022)   PRAPARE - Administrator, Civil Service (Medical): No    Lack of Transportation (Non-Medical): No  Physical Activity: Insufficiently Active (06/20/2022)   Exercise Vital Sign    Days of Exercise per Week: 3 days    Minutes of Exercise per Session: 30 min  Stress: No Stress Concern Present (06/20/2022)   [employer] of Occupational Health - Occupational Stress Questionnaire    Feeling of Stress : Not at all  Social Connections: Socially Integrated (06/20/2022)   Social Connection and Isolation Panel [NHANES]    Frequency of Communication with Friends and Family: More than three times a week    Frequency of Social Gatherings with Friends and Family: More than three times a week    Attends Religious Services: More than 4 times per year    Active Member of [employer] or Organizations: Yes    Attends Engineer, structural: More than 4 times per year    Marital Status: Married  Catering manager Violence: Not At Risk (07/08/2022)   Humiliation, Afraid, Rape, and Kick questionnaire    Fear of Current or Ex-Partner: No    Emotionally Abused: No    Physically Abused: No    Sexually Abused: No   Family History  Problem Relation Age of Onset   Breast cancer Maternal Aunt    Cancer Mother    Heart disease Father    Heart attack Father        cause of death   Cancer Sister    Colon cancer Sister        dx 2020   Cancer Brother        metastatic w/ uncertain primary dx 2020   Anesthesia problems Neg Hx    Hypotension Neg Hx    Malignant hyperthermia Neg Hx    Pseudochol deficiency Neg Hx    Stomach cancer Neg Hx     Objective: Office vital signs reviewed. BP 135/84   Pulse 84   Temp 98.4 F (36.9 C)   Ht 5\' 2"  (1.575 m)   Wt 168 lb (76.2 kg)   SpO2 93%   BMI 30.73 kg/m   Physical Examination:  General: Awake, alert, well nourished, No acute distress HEENT: sclera white, MMM Cardio:  regular rate and rhythm, S1S2 heard, no murmurs appreciated Pulm: clear to auscultation bilaterally, no wheezes, rhonchi or rales; normal work of breathing on room air MSK: Antalgic gait.  No tenderness to palpation down the midline of the lumbar spine.  Minimal tenderness palpation in the right gluteus.  No tenderness to palpation over the greater trochanteric bursa on the right.  She has tenderness to palpation along the posterior heel and there  is a visible soft tissue swelling in this area.  Has full dorsiflexion and plantarflexion of that right foot  Diabetic Foot Exam - Simple   Simple Foot Form Diabetic Foot exam was performed with the following findings: Yes 12/12/2022  9:29 AM  Visual Inspection See comments: Yes Sensation Testing Intact to touch and monofilament testing bilaterally: Yes Pulse Check Posterior Tibialis and Dorsalis pulse intact bilaterally: Yes Comments Soft tissue swelling noted along the posterior heel.  See above      Assessment/ Plan: 72 y.o. female   Diabetes mellitus treated with oral medication (HCC) - Plan: sitaGLIPtin (JANUVIA) 100 MG tablet  Hyperlipidemia associated with type 2 diabetes mellitus (HCC)  Achilles tendinitis of right lower extremity - Plan: meloxicam (MOBIC) 7.5 MG tablet, Ambulatory referral to Physical Therapy  Sciatica, right side - Plan: meloxicam (MOBIC) 7.5 MG tablet, tiZANidine (ZANAFLEX) 4 MG tablet, gabapentin (NEURONTIN) 300 MG capsule, Ambulatory referral to Physical Therapy  Sugar is not controlled with A1c of 7.1.  Advance Januvia to 100 mg.  Offered going back on an oral GLP but given cost she prefers to stick with Januvia.  Counseled on diet modification  Continue statin.  Mobic sent in for Achilles tendinitis.  Discussed icing.  Home stretches provided.  Will send to physical therapy for consideration of nitroglycerin patching etc. if needed  Sciatica suspected on right side.  No evidence of bursitis.  Meloxicam  ordered.  Avoid other NSAIDs.  Muscle relaxer as needed.  Caution sedation.  Gabapentin given reports of burning sensation in leg at night.  Again caution sedation.  Referral to physical therapy  Raliegh Ip, DO [facility] [phone]

## 2022-12-13 ENCOUNTER — Telehealth: Payer: Self-pay | Admitting: Family Medicine

## 2022-12-13 DIAGNOSIS — E1165 Type 2 diabetes mellitus with hyperglycemia: Secondary | ICD-10-CM

## 2022-12-13 MED ORDER — METFORMIN HCL 500 MG PO TABS
500.0000 mg | ORAL_TABLET | Freq: Two times a day (BID) | ORAL | 0 refills | Status: DC
Start: 2022-12-13 — End: 2023-03-11

## 2022-12-13 NOTE — Telephone Encounter (Signed)
Pt says that diabetic rx was sent yesterday to pharmacy but is $400 and pt is asking if she can go back on metformin instead. Please call back.

## 2022-12-13 NOTE — Telephone Encounter (Signed)
Pt returned missed call. Reviewed providers notes with pt. Pt voiced understanding and said thank you.

## 2022-12-13 NOTE — Telephone Encounter (Signed)
PCP is out of the office. Is there a reason that she stopped metformin?

## 2022-12-13 NOTE — Addendum Note (Signed)
Addended by: Gabriel Earing on: 12/13/2022 04:20 PM   Modules accepted: Orders

## 2022-12-13 NOTE — Telephone Encounter (Signed)
I've sent in metformin. Start with 1 tablet once a day for 1 week, then increase to 1 tablet twice a day.

## 2022-12-13 NOTE — Telephone Encounter (Signed)
Pt aware metformin sent in and will pick up tomorrow.

## 2022-12-13 NOTE — Telephone Encounter (Addendum)
Pt says she has never been on metformin. She meant to ask if she could be put on it because she thinks her insurance will pay for it.

## 2023-01-21 ENCOUNTER — Ambulatory Visit (INDEPENDENT_AMBULATORY_CARE_PROVIDER_SITE_OTHER): Payer: Medicare HMO | Admitting: Family Medicine

## 2023-01-21 ENCOUNTER — Encounter: Payer: Self-pay | Admitting: Family Medicine

## 2023-01-21 VITALS — BP 150/96 | Temp 98.7°F | Ht 62.0 in | Wt 166.0 lb

## 2023-01-21 DIAGNOSIS — R42 Dizziness and giddiness: Secondary | ICD-10-CM

## 2023-01-21 DIAGNOSIS — R030 Elevated blood-pressure reading, without diagnosis of hypertension: Secondary | ICD-10-CM | POA: Diagnosis not present

## 2023-01-21 MED ORDER — MECLIZINE HCL 25 MG PO TABS
25.0000 mg | ORAL_TABLET | Freq: Three times a day (TID) | ORAL | 0 refills | Status: DC | PRN
Start: 2023-01-21 — End: 2023-07-15

## 2023-01-21 NOTE — Progress Notes (Addendum)
Subjective: [id number] PCP: Raliegh Ip, DO BMW:UXLKG Nichole Cunningham is a 72 y.o. female presenting to clinic today for:  1. Dizziness Patient reports she started feeling dizzy yesterday when she was walking around her kitchen.  At our 10/16 visit, we added Zanaflex, Gabapentin and Mobic.  She notes that she did not take any of these medications with any regularity since her last visit.  She did not take it prior to onset of symptoms.  She was not able to afford Januvia so switched over to metformin and does report a little bit of GI upset with that but she has been tolerating the medication without difficulty otherwise blood sugars have been running around 130s in the morning.  She admits that she consumes quite a bit of salt and often adds this to her foods.  She has not been having any chest pain, blurred vision, difficulty swallowing, unilateral weakness or sensory changes.  Dizziness is not positional.  She has not had any URI symptoms.  She admits that she does not hydrate well but has been trying to make a better effort.  She is currently dizzy  ROS: Per HPI  No Known Allergies Past Medical History:  Diagnosis Date   Complication of anesthesia    Hyperlipidemia    Partial small bowel obstruction (HCC) 07/08/2022   PONV (postoperative nausea and vomiting)     Current Outpatient Medications:    augmented betamethasone dipropionate (DIPROLENE-AF) 0.05 % cream, Apply 1 Application topically 2 (two) times daily as needed (flare up of itching skin on legs)., Disp: , Rfl:    Cholecalciferol 50 MCG (2000 UT) TABS, Take 1 tablet by mouth daily., Disp: , Rfl:    fenofibrate 160 MG tablet, TAKE 1 TABLET (160 MG TOTAL) DAILY BY MOUTH. (Patient taking differently: Take 160 mg by mouth daily.), Disp: 90 tablet, Rfl: 3   gabapentin (NEURONTIN) 300 MG capsule, Take 1 capsule (300 mg total) by mouth at bedtime as needed (burning leg pain at night time)., Disp: 30 capsule, Rfl: 0   glucose blood  (ONETOUCH VERIO) test strip, TEST BLOOD GLUCOSE DAILY AS NEEDED E11.9 (ONE TOUCH VERIO REFLECT), Disp: 100 strip, Rfl: 3   Lancet Device MISC, Test BGs once daily as needed E11.9 (one touch verio reflect), Disp: 100 each, Rfl: 12   levocetirizine (XYZAL) 5 MG tablet, Take 1 tablet (5 mg total) by mouth at bedtime as needed for allergies. For allergies/ drainage, Disp: 90 tablet, Rfl: 3   meloxicam (MOBIC) 7.5 MG tablet, Take 1 tablet (7.5 mg total) by mouth daily as needed for pain (take with food.)., Disp: 30 tablet, Rfl: 0   metFORMIN (GLUCOPHAGE) 500 MG tablet, Take 1 tablet (500 mg total) by mouth 2 (two) times daily with a meal., Disp: 180 tablet, Rfl: 0   Omega-3 Fatty Acids (OMEGA 3 PO), Take 1 tablet by mouth daily., Disp: , Rfl:    rosuvastatin (CRESTOR) 10 MG tablet, Take 1 tablet (10 mg total) by mouth daily., Disp: 90 tablet, Rfl: 3   sitaGLIPtin (JANUVIA) 100 MG tablet, Take 1 tablet (100 mg total) by mouth daily. For diabetes, Disp: 90 tablet, Rfl: 3   tiZANidine (ZANAFLEX) 4 MG tablet, Take 1 tablet (4 mg total) by mouth every 8 (eight) hours as needed for muscle spasms., Disp: 30 tablet, Rfl: 0 Social History   Socioeconomic History   Marital status: Married    Spouse name: Leonette Most   Number of children: 2   Years of education: 50  Highest education level: High school graduate  Occupational History   Occupation: retired  Tobacco Use   Smoking status: Never   Smokeless tobacco: Never  Vaping Use   Vaping status: Never Used  Substance and Sexual Activity   Alcohol use: No   Drug use: No   Sexual activity: Yes    Birth control/protection: Post-menopausal  Other Topics Concern   Not on file  Social History Narrative   Not on file   Social Determinants of Health   Financial Resource Strain: Low Risk  (06/20/2022)   Overall Financial Resource Strain (CARDIA)    Difficulty of Paying Living Expenses: Not hard at all  Food Insecurity: No Food Insecurity (07/08/2022)    Hunger Vital Sign    Worried About Running Out of Food in the Last Year: Never true    Ran Out of Food in the Last Year: Never true  Transportation Needs: No Transportation Needs (07/08/2022)   PRAPARE - Administrator, Civil Service (Medical): No    Lack of Transportation (Non-Medical): No  Physical Activity: Insufficiently Active (06/20/2022)   Exercise Vital Sign    Days of Exercise per Week: 3 days    Minutes of Exercise per Session: 30 min  Stress: No Stress Concern Present (06/20/2022)   [employer] of Occupational Health - Occupational Stress Questionnaire    Feeling of Stress : Not at all  Social Connections: Socially Integrated (06/20/2022)   Social Connection and Isolation Panel [NHANES]    Frequency of Communication with Friends and Family: More than three times a week    Frequency of Social Gatherings with Friends and Family: More than three times a week    Attends Religious Services: More than 4 times per year    Active Member of [employer] or Organizations: Yes    Attends Engineer, structural: More than 4 times per year    Marital Status: Married  Catering manager Violence: Not At Risk (07/08/2022)   Humiliation, Afraid, Rape, and Kick questionnaire    Fear of Current or Ex-Partner: No    Emotionally Abused: No    Physically Abused: No    Sexually Abused: No   Family History  Problem Relation Age of Onset   Breast cancer Maternal Aunt    Cancer Mother    Heart disease Father    Heart attack Father        cause of death   Cancer Sister    Colon cancer Sister        dx 2020   Cancer Brother        metastatic w/ uncertain primary dx 2020   Anesthesia problems Neg Hx    Hypotension Neg Hx    Malignant hyperthermia Neg Hx    Pseudochol deficiency Neg Hx    Stomach cancer Neg Hx     Objective: Office vital signs reviewed. BP (!) 150/96   Temp 98.7 F (37.1 C)   Ht 5\' 2"  (1.575 m)   Wt 166 lb (75.3 kg)   SpO2 95%   BMI 30.36 kg/m    Physical Examination:  General: Awake, alert, well nourished, No acute distress HEENT: TMs and tact bilaterally with normal reflex.  Sclera white.  PERRLA.  EOMI. Cardio: regular rate and rhythm, S1S2 heard, no murmurs appreciated Pulm: clear to auscultation bilaterally, no wheezes, rhonchi or rales; normal work of breathing on room air Neuro: 5/5 UE and LE Strength and light touch sensation grossly intact, cranial nerves II through XII  grossly intact.  Normal cerebellar testing of the upper extremities and negative Romberg  Orthostatic VS for the past 72 hrs (Last 3 readings):  Orthostatic BP Patient Position BP Location Cuff Size Orthostatic Pulse  01/21/23 0925 (!) 150/96 Standing Left Arm Normal --  01/21/23 0923 (!) 158/99 Supine Left Arm Normal --  01/21/23 0916 (!) 154/93 Sitting Left Arm Normal 89    Assessment/ Plan: 72 y.o. female   Dizziness - Plan: meclizine (ANTIVERT) 25 MG tablet, MR Brain Wo Contrast  Elevated blood pressure reading - Plan: MR Brain Wo Contrast  I am given her some meclizine for dizziness.  Difficult to tell if the dizziness has caused the elevation of blood pressure reading or vice versa.  Regardless her blood pressure is not acceptable range and we discussed need for reduction.  She would like to pursue lifestyle modification we discussed how to eliminate unnecessary salts.  I would like her to monitor blood pressures at home with goal of less than 150/90 and ideally below 140/90.  I discussed her care with her niece, who is a Engineer, civil (consulting) and will help her with appropriate blood pressure monitoring etc.  We discussed red flag signs and symptoms warranting further evaluation.  Her physical exam was largely unremarkable today except for reported dizziness and elevation in blood pressures.  May need to consider initiation of something like Norvasc if unable to reduce blood pressures independently   **addendum. Dizziness no better, regardless of normal BPs.  MRI  brain ordered, referral to Neuro vs PT pending results.  Raliegh Ip, DO [facility] [phone]

## 2023-01-21 NOTE — Patient Instructions (Signed)
Need to cut back salt. Goal blood pressure is less than 140/90. Hydrate well with water. Call me in 1 week with what your blood pressure readings are at home. I've included instructions for how to take the blood pressure accurately at home Small amount of Meclizine sent in to help with dizziness if needed. It can cause sleepiness.

## 2023-01-22 ENCOUNTER — Ambulatory Visit: Payer: Medicare HMO

## 2023-01-29 ENCOUNTER — Ambulatory Visit (HOSPITAL_COMMUNITY)
Admission: RE | Admit: 2023-01-29 | Discharge: 2023-01-29 | Disposition: A | Discharge status: Home / Self Care | Payer: Medicare HMO | Source: Ambulatory Visit | Attending: Family Medicine | Admitting: Family Medicine

## 2023-01-29 DIAGNOSIS — R42 Dizziness and giddiness: Secondary | ICD-10-CM | POA: Insufficient documentation

## 2023-01-29 DIAGNOSIS — I6782 Cerebral ischemia: Secondary | ICD-10-CM | POA: Diagnosis not present

## 2023-01-29 DIAGNOSIS — R9089 Other abnormal findings on diagnostic imaging of central nervous system: Secondary | ICD-10-CM | POA: Diagnosis not present

## 2023-01-29 DIAGNOSIS — R030 Elevated blood-pressure reading, without diagnosis of hypertension: Secondary | ICD-10-CM | POA: Insufficient documentation

## 2023-01-29 NOTE — Addendum Note (Signed)
Addended by: Raliegh Ip on: 01/29/2023 07:38 AM   Modules accepted: Orders

## 2023-01-31 NOTE — Telephone Encounter (Signed)
Pt has been made aware of results. Her husband was made aware originally and he did not understand. Pt states that she does not want to have PT at this time. She would like to see if her symptoms improve. If they do not or become worse she will contact the office. Pt has a follow up with G in Feb.

## 2023-01-31 NOTE — Telephone Encounter (Signed)
Copied from CRM [id number]. Topic: Clinical - Lab/Test Results >> Jan 31, 2023 12:16 PM Nichole Cunningham wrote: Reason for CRM: PT is inquiring about test results and states she missed a call from the nurse

## 2023-02-25 DIAGNOSIS — L308 Other specified dermatitis: Secondary | ICD-10-CM | POA: Diagnosis not present

## 2023-03-11 ENCOUNTER — Other Ambulatory Visit: Payer: Self-pay | Admitting: Family Medicine

## 2023-03-11 DIAGNOSIS — E1165 Type 2 diabetes mellitus with hyperglycemia: Secondary | ICD-10-CM

## 2023-03-20 DIAGNOSIS — L308 Other specified dermatitis: Secondary | ICD-10-CM | POA: Diagnosis not present

## 2023-04-15 ENCOUNTER — Encounter: Payer: Self-pay | Admitting: Family Medicine

## 2023-04-15 ENCOUNTER — Ambulatory Visit (INDEPENDENT_AMBULATORY_CARE_PROVIDER_SITE_OTHER): Payer: Medicare HMO | Admitting: Family Medicine

## 2023-04-15 VITALS — BP 123/79 | HR 91 | Temp 97.8°F | Ht 62.0 in | Wt 164.0 lb

## 2023-04-15 DIAGNOSIS — Z7984 Long term (current) use of oral hypoglycemic drugs: Secondary | ICD-10-CM

## 2023-04-15 DIAGNOSIS — Z1211 Encounter for screening for malignant neoplasm of colon: Secondary | ICD-10-CM

## 2023-04-15 DIAGNOSIS — E119 Type 2 diabetes mellitus without complications: Secondary | ICD-10-CM | POA: Diagnosis not present

## 2023-04-15 DIAGNOSIS — E785 Hyperlipidemia, unspecified: Secondary | ICD-10-CM

## 2023-04-15 DIAGNOSIS — E1169 Type 2 diabetes mellitus with other specified complication: Secondary | ICD-10-CM

## 2023-04-15 LAB — BAYER DCA HB A1C WAIVED: HB A1C (BAYER DCA - WAIVED): 7.6 % — ABNORMAL HIGH (ref 4.8–5.6)

## 2023-04-15 MED ORDER — FENOFIBRATE 160 MG PO TABS: ORAL_TABLET | ORAL | 3 refills | Status: DC

## 2023-04-15 MED ORDER — SITAGLIPTIN PHOSPHATE 100 MG PO TABS: 100.0000 mg | ORAL_TABLET | Freq: Every day | ORAL | 3 refills | Status: DC

## 2023-04-15 NOTE — Progress Notes (Signed)
Subjective: CC:DM PCP: Raliegh Ip, DO BJY:[id number] Nichole Cunningham is a 73 y.o. female presenting to clinic today for:  1. Type 2 Diabetes with hyperlipidemia:  Reports fasting blood sugars have been ranging anywhere between 130s and 140s.  No hypoglycemic episodes.  She is only taking metformin 500 mg twice daily.  Apparently Januvia was not affordable so she has not been taking this.  She does admit to loose stools with metformin and sometimes she finds it hard to remember the evening dose of metformin.  She is compliant with her rosuvastatin and fenofibrate and needs a refill on her fenofibrate.  Diabetes Health Maintenance Due  Topic Date Due   OPHTHALMOLOGY EXAM  05/03/2022   HEMOGLOBIN A1C  06/10/2023   FOOT EXAM  12/12/2023    Last A1c:  Lab Results  Component Value Date   HGBA1C 7.1 (H) 12/10/2022    ROS: Denies chest pain, shortness of breath, blurred vision, polydipsia or polyuria   ROS: Per HPI  No Known Allergies Past Medical History:  Diagnosis Date   Complication of anesthesia    Hyperlipidemia    Partial small bowel obstruction (HCC) 07/08/2022   PONV (postoperative nausea and vomiting)     Current Outpatient Medications:    augmented betamethasone dipropionate (DIPROLENE-AF) 0.05 % cream, Apply 1 Application topically 2 (two) times daily as needed (flare up of itching skin on legs)., Disp: , Rfl:    Cholecalciferol 50 MCG (2000 UT) TABS, Take 1 tablet by mouth daily., Disp: , Rfl:    fenofibrate 160 MG tablet, TAKE 1 TABLET (160 MG TOTAL) DAILY BY MOUTH. (Patient taking differently: Take 160 mg by mouth daily.), Disp: 90 tablet, Rfl: 3   gabapentin (NEURONTIN) 300 MG capsule, Take 1 capsule (300 mg total) by mouth at bedtime as needed (burning leg pain at night time)., Disp: 30 capsule, Rfl: 0   glucose blood (ONETOUCH VERIO) test strip, TEST BLOOD GLUCOSE DAILY AS NEEDED E11.9 (ONE TOUCH VERIO REFLECT), Disp: 100 strip, Rfl: 3   Lancet Device MISC, Test  BGs once daily as needed E11.9 (one touch verio reflect), Disp: 100 each, Rfl: 12   levocetirizine (XYZAL) 5 MG tablet, Take 1 tablet (5 mg total) by mouth at bedtime as needed for allergies. For allergies/ drainage, Disp: 90 tablet, Rfl: 3   meclizine (ANTIVERT) 25 MG tablet, Take 1 tablet (25 mg total) by mouth 3 (three) times daily as needed for dizziness., Disp: 30 tablet, Rfl: 0   meloxicam (MOBIC) 7.5 MG tablet, Take 1 tablet (7.5 mg total) by mouth daily as needed for pain (take with food.)., Disp: 30 tablet, Rfl: 0   metFORMIN (GLUCOPHAGE) 500 MG tablet, TAKE 1 TABLET BY MOUTH 2 TIMES DAILY WITH A MEAL., Disp: 180 tablet, Rfl: 0   Omega-3 Fatty Acids (OMEGA 3 PO), Take 1 tablet by mouth daily., Disp: , Rfl:    rosuvastatin (CRESTOR) 10 MG tablet, Take 1 tablet (10 mg total) by mouth daily., Disp: 90 tablet, Rfl: 3   sitaGLIPtin (JANUVIA) 100 MG tablet, Take 1 tablet (100 mg total) by mouth daily. For diabetes, Disp: 90 tablet, Rfl: 3   tiZANidine (ZANAFLEX) 4 MG tablet, Take 1 tablet (4 mg total) by mouth every 8 (eight) hours as needed for muscle spasms., Disp: 30 tablet, Rfl: 0 Social History   Socioeconomic History   Marital status: Married    Spouse name: Leonette Most   Number of children: 2   Years of education: 12   Highest education level:  High school graduate  Occupational History   Occupation: retired  Tobacco Use   Smoking status: Never   Smokeless tobacco: Never  Vaping Use   Vaping status: Never Used  Substance and Sexual Activity   Alcohol use: No   Drug use: No   Sexual activity: Yes    Birth control/protection: Post-menopausal  Other Topics Concern   Not on file  Social History Narrative   Not on file   Social Drivers of Health   Financial Resource Strain: Low Risk  (06/20/2022)   Overall Financial Resource Strain (CARDIA)    Difficulty of Paying Living Expenses: Not hard at all  Food Insecurity: No Food Insecurity (07/08/2022)   Hunger Vital Sign    Worried  About Running Out of Food in the Last Year: Never true    Ran Out of Food in the Last Year: Never true  Transportation Needs: No Transportation Needs (07/08/2022)   PRAPARE - Administrator, Civil Service (Medical): No    Lack of Transportation (Non-Medical): No  Physical Activity: Insufficiently Active (06/20/2022)   Exercise Vital Sign    Days of Exercise per Week: 3 days    Minutes of Exercise per Session: 30 min  Stress: No Stress Concern Present (06/20/2022)   [employer] of Occupational Health - Occupational Stress Questionnaire    Feeling of Stress : Not at all  Social Connections: Socially Integrated (06/20/2022)   Social Connection and Isolation Panel [NHANES]    Frequency of Communication with Friends and Family: More than three times a week    Frequency of Social Gatherings with Friends and Family: More than three times a week    Attends Religious Services: More than 4 times per year    Active Member of [employer] or Organizations: Yes    Attends Engineer, structural: More than 4 times per year    Marital Status: Married  Catering manager Violence: Not At Risk (07/08/2022)   Humiliation, Afraid, Rape, and Kick questionnaire    Fear of Current or Ex-Partner: No    Emotionally Abused: No    Physically Abused: No    Sexually Abused: No   Family History  Problem Relation Age of Onset   Breast cancer Maternal Aunt    Cancer Mother    Heart disease Father    Heart attack Father        cause of death   Cancer Sister    Colon cancer Sister        dx 2020   Cancer Brother        metastatic w/ uncertain primary dx 2020   Anesthesia problems Neg Hx    Hypotension Neg Hx    Malignant hyperthermia Neg Hx    Pseudochol deficiency Neg Hx    Stomach cancer Neg Hx     Objective: Office vital signs reviewed. BP 123/79   Pulse 91   Temp 97.8 F (36.6 C)   Ht 5\' 2"  (1.575 m)   Wt 164 lb (74.4 kg)   SpO2 93%   BMI 30.00 kg/m   Physical Examination:   General: Awake, alert, well nourished, No acute distress HEENT: Sclera white.  Moist mucous membranes Cardio: regular rate and rhythm, S1S2 heard, no murmurs appreciated Pulm: clear to auscultation bilaterally, no wheezes, rhonchi or rales; normal work of breathing on room air Extremities: warm, well perfused, No edema, cyanosis or clubbing; +2 pulses bilaterally   Assessment/ Plan: 73 y.o. female   Diabetes mellitus treated with  oral medication (HCC) - Plan: Bayer DCA Hb A1c Waived, sitaGLIPtin (JANUVIA) 100 MG tablet  Hyperlipidemia associated with type 2 diabetes mellitus (HCC) - Plan: fenofibrate 160 MG tablet  Sugar not at goal with A1c at 7.6 today.  This is a rise from previous checkup.  Sounds like it is largely due to noncompliance with Januvia due to financial constraints.  I have advised her to go ahead and try and pick this up and if it is not affordable to contact me ASAP and we will try and pick something else.  I will plan to transition her over to metformin extended release 500 mg so that she can have just once daily dosing and then perhaps we can either add glipizide or Actos in efforts to get blood sugar down.  Continue statin.  Fenofibrate renewed.  She is to schedule colonoscopy with gastroenterology.  Looks like she used to see Dr. Julio Alm who I believe has left [place] so we will see if we can get her established with a [facility] which is what her preference was  Raliegh Ip, DO [facility] [phone]

## 2023-04-16 ENCOUNTER — Encounter (INDEPENDENT_AMBULATORY_CARE_PROVIDER_SITE_OTHER): Payer: Self-pay | Admitting: *Deleted

## 2023-05-08 ENCOUNTER — Telehealth: Payer: Self-pay | Admitting: *Deleted

## 2023-05-08 NOTE — Telephone Encounter (Signed)
 Procedure: colonoscopy   Height: 5'2" Weight: 160 lb      BMI: 29.3  Have you had a colonoscopy before?  Yes, 02/2012, [name]  Do you have family history of colon cancer?  Yes, sister  Do you have a family history of polyps? no  Previous colonoscopy with polyps removed? no  Do you have a history colorectal cancer?   no  Are you diabetic?  Yes, Type 2  Do you have a prosthetic or mechanical heart valve? no  Do you have a pacemaker/defibrillator?   no  Have you had endocarditis/atrial fibrillation?  no  Do you use supplemental oxygen/CPAP?  no  Have you had joint replacement within the last 12 months?  no  Do you tend to be constipated or have to use laxatives?  no   Do you have history of alcohol use? If yes, how much and how often.  no  Do you have history or are you using drugs? If yes, what do are you  using?  no  Have you ever had a stroke/heart attack?  no  Have you ever had a heart or other vascular stent placed,?no  Do you take weight loss medication? no  female patients,: have you had a hysterectomy? no                              are you post menopausal?  yes                              do you still have your menstrual cycle? no    Date of last menstrual period? unknown  Do you take any blood-thinning medications such as: (Plavix, aspirin, Coumadin, Aggrenox, Brilinta, Xarelto, Eliquis, Pradaxa, Savaysa or Effient)? no  If yes we need the name, milligram, dosage and who is prescribing doctor:  n/a             Current Outpatient Medications  Medication Sig Dispense Refill   Cholecalciferol 50 MCG (2000 UT) TABS Take 1 tablet by mouth daily.     fenofibrate 160 MG tablet TAKE 1 TABLET (160 MG TOTAL) DAILY BY MOUTH. 90 tablet 3   glucose blood (ONETOUCH VERIO) test strip TEST BLOOD GLUCOSE DAILY AS NEEDED E11.9 (ONE TOUCH VERIO REFLECT) 100 strip 3   Lancet Device MISC Test BGs once daily as needed E11.9 (one touch verio reflect) 100 each 12    rosuvastatin (CRESTOR) 10 MG tablet Take 1 tablet (10 mg total) by mouth daily. 90 tablet 3   sitaGLIPtin (JANUVIA) 100 MG tablet Take 1 tablet (100 mg total) by mouth daily. For diabetes 90 tablet 3   augmented betamethasone dipropionate (DIPROLENE-AF) 0.05 % cream Apply 1 Application topically 2 (two) times daily as needed (flare up of itching skin on legs). (Patient not taking: Reported on 05/08/2023)     gabapentin (NEURONTIN) 300 MG capsule Take 1 capsule (300 mg total) by mouth at bedtime as needed (burning leg pain at night time). (Patient not taking: Reported on 05/08/2023) 30 capsule 0   levocetirizine (XYZAL) 5 MG tablet Take 1 tablet (5 mg total) by mouth at bedtime as needed for allergies. For allergies/ drainage (Patient not taking: Reported on 05/08/2023) 90 tablet 3   meclizine (ANTIVERT) 25 MG tablet Take 1 tablet (25 mg total) by mouth 3 (three) times daily as needed for dizziness. (Patient not taking: Reported on 04/15/2023) 30  tablet 0   meloxicam (MOBIC) 7.5 MG tablet Take 1 tablet (7.5 mg total) by mouth daily as needed for pain (take with food.). (Patient not taking: Reported on 05/08/2023) 30 tablet 0   metFORMIN (GLUCOPHAGE) 500 MG tablet TAKE 1 TABLET BY MOUTH 2 TIMES DAILY WITH A MEAL. (Patient not taking: Reported on 05/08/2023) 180 tablet 0   Omega-3 Fatty Acids (OMEGA 3 PO) Take 1 tablet by mouth daily. (Patient not taking: Reported on 05/08/2023)     tiZANidine (ZANAFLEX) 4 MG tablet Take 1 tablet (4 mg total) by mouth every 8 (eight) hours as needed for muscle spasms. (Patient not taking: Reported on 05/08/2023) 30 tablet 0   No current facility-administered medications for this visit.    No Known Allergies

## 2023-05-11 ENCOUNTER — Other Ambulatory Visit: Payer: Self-pay | Admitting: Family

## 2023-05-11 DIAGNOSIS — B9689 Other specified bacterial agents as the cause of diseases classified elsewhere: Secondary | ICD-10-CM

## 2023-05-14 ENCOUNTER — Ambulatory Visit (INDEPENDENT_AMBULATORY_CARE_PROVIDER_SITE_OTHER)

## 2023-05-14 ENCOUNTER — Encounter: Payer: Self-pay | Admitting: Family Medicine

## 2023-05-14 ENCOUNTER — Ambulatory Visit (INDEPENDENT_AMBULATORY_CARE_PROVIDER_SITE_OTHER): Admitting: Family Medicine

## 2023-05-14 ENCOUNTER — Ambulatory Visit: Admitting: Family Medicine

## 2023-05-14 VITALS — BP 114/75 | HR 88 | Temp 98.9°F | Ht 62.0 in | Wt 165.0 lb

## 2023-05-14 DIAGNOSIS — R059 Cough, unspecified: Secondary | ICD-10-CM | POA: Diagnosis not present

## 2023-05-14 DIAGNOSIS — J069 Acute upper respiratory infection, unspecified: Secondary | ICD-10-CM

## 2023-05-14 LAB — VERITOR FLU A/B WAIVED
Influenza A: NEGATIVE
Influenza B: NEGATIVE

## 2023-05-14 MED ORDER — BENZONATATE 100 MG PO CAPS: 100.0000 mg | ORAL_CAPSULE | Freq: Three times a day (TID) | ORAL | 0 refills | Status: DC | PRN

## 2023-05-14 MED ORDER — AZELASTINE HCL 0.1 % NA SOLN: 1.0000 | Freq: Two times a day (BID) | NASAL | 12 refills | Status: DC

## 2023-05-14 MED ORDER — GUAIFENESIN-CODEINE 100-10 MG/5ML PO SOLN: 5.0000 mL | Freq: Four times a day (QID) | ORAL | 0 refills | Status: DC | PRN

## 2023-05-14 NOTE — Progress Notes (Signed)
 Subjective: CC:URI PCP: Raliegh Ip, DO [id number] Nichole Cunningham is a 73 y.o. female presenting to clinic today for:  1. URI She reports onset on Thursday.  She reports cough and now some rib pain at the right lower rib cage.  She reports possible low-grade fever with Tmax of 100 F on Friday.  She has had no myalgia.  No hemoptysis.  No wheezing or shortness of breath.  She notes that she had a few family members that were sick with similar and that lasted about a week.  She reports postnasal drainage and rhinorrhea.  Utilizing her antihistamine intermittently but has not done so since she has been ill   ROS: Per HPI  No Known Allergies Past Medical History:  Diagnosis Date   Complication of anesthesia    Hyperlipidemia    Partial small bowel obstruction (HCC) 07/08/2022   PONV (postoperative nausea and vomiting)     Current Outpatient Medications:    acetaminophen (TYLENOL) 500 MG tablet, Take 500 mg by mouth every 6 (six) hours as needed., Disp: , Rfl:    augmented betamethasone dipropionate (DIPROLENE-AF) 0.05 % cream, Apply 1 Application topically 2 (two) times daily as needed (flare up of itching skin on legs)., Disp: , Rfl:    Cholecalciferol 50 MCG (2000 UT) TABS, Take 1 tablet by mouth daily., Disp: , Rfl:    fenofibrate 160 MG tablet, TAKE 1 TABLET (160 MG TOTAL) DAILY BY MOUTH., Disp: 90 tablet, Rfl: 3   gabapentin (NEURONTIN) 300 MG capsule, Take 1 capsule (300 mg total) by mouth at bedtime as needed (burning leg pain at night time)., Disp: 30 capsule, Rfl: 0   glucose blood (ONETOUCH VERIO) test strip, TEST BLOOD GLUCOSE DAILY AS NEEDED E11.9 (ONE TOUCH VERIO REFLECT), Disp: 100 strip, Rfl: 3   Lancet Device MISC, Test BGs once daily as needed E11.9 (one touch verio reflect), Disp: 100 each, Rfl: 12   levocetirizine (XYZAL) 5 MG tablet, Take 1 tablet (5 mg total) by mouth at bedtime as needed for allergies. For allergies/ drainage, Disp: 90 tablet, Rfl: 3   meclizine  (ANTIVERT) 25 MG tablet, Take 1 tablet (25 mg total) by mouth 3 (three) times daily as needed for dizziness., Disp: 30 tablet, Rfl: 0   meloxicam (MOBIC) 7.5 MG tablet, Take 1 tablet (7.5 mg total) by mouth daily as needed for pain (take with food.)., Disp: 30 tablet, Rfl: 0   metFORMIN (GLUCOPHAGE) 500 MG tablet, TAKE 1 TABLET BY MOUTH 2 TIMES DAILY WITH A MEAL., Disp: 180 tablet, Rfl: 0   Omega-3 Fatty Acids (OMEGA 3 PO), Take 1 tablet by mouth daily., Disp: , Rfl:    rosuvastatin (CRESTOR) 10 MG tablet, Take 1 tablet (10 mg total) by mouth daily., Disp: 90 tablet, Rfl: 3   sitaGLIPtin (JANUVIA) 100 MG tablet, Take 1 tablet (100 mg total) by mouth daily. For diabetes, Disp: 90 tablet, Rfl: 3   tiZANidine (ZANAFLEX) 4 MG tablet, Take 1 tablet (4 mg total) by mouth every 8 (eight) hours as needed for muscle spasms., Disp: 30 tablet, Rfl: 0 Social History   Socioeconomic History   Marital status: Married    Spouse name: Leonette Most   Number of children: 2   Years of education: 12   Highest education level: High school graduate  Occupational History   Occupation: retired  Tobacco Use   Smoking status: Never   Smokeless tobacco: Never  Vaping Use   Vaping status: Never Used  Substance and Sexual Activity  Alcohol use: No   Drug use: No   Sexual activity: Yes    Birth control/protection: Post-menopausal  Other Topics Concern   Not on file  Social History Narrative   Not on file   Social Drivers of Health   Financial Resource Strain: Low Risk  (06/20/2022)   Overall Financial Resource Strain (CARDIA)    Difficulty of Paying Living Expenses: Not hard at all  Food Insecurity: No Food Insecurity (07/08/2022)   Hunger Vital Sign    Worried About Running Out of Food in the Last Year: Never true    Ran Out of Food in the Last Year: Never true  Transportation Needs: No Transportation Needs (07/08/2022)   PRAPARE - Administrator, Civil Service (Medical): No    Lack of  Transportation (Non-Medical): No  Physical Activity: Insufficiently Active (06/20/2022)   Exercise Vital Sign    Days of Exercise per Week: 3 days    Minutes of Exercise per Session: 30 min  Stress: No Stress Concern Present (06/20/2022)   [employer] of Occupational Health - Occupational Stress Questionnaire    Feeling of Stress : Not at all  Social Connections: Socially Integrated (06/20/2022)   Social Connection and Isolation Panel [NHANES]    Frequency of Communication with Friends and Family: More than three times a week    Frequency of Social Gatherings with Friends and Family: More than three times a week    Attends Religious Services: More than 4 times per year    Active Member of [employer] or Organizations: Yes    Attends Engineer, structural: More than 4 times per year    Marital Status: Married  Catering manager Violence: Not At Risk (07/08/2022)   Humiliation, Afraid, Rape, and Kick questionnaire    Fear of Current or Ex-Partner: No    Emotionally Abused: No    Physically Abused: No    Sexually Abused: No   Family History  Problem Relation Age of Onset   Breast cancer Maternal Aunt    Cancer Mother    Heart disease Father    Heart attack Father        cause of death   Cancer Sister    Colon cancer Sister        dx 2020   Cancer Brother        metastatic w/ uncertain primary dx 2020   Anesthesia problems Neg Hx    Hypotension Neg Hx    Malignant hyperthermia Neg Hx    Pseudochol deficiency Neg Hx    Stomach cancer Neg Hx     Objective: Office vital signs reviewed. BP 114/75   Pulse 88   Temp 98.9 F (37.2 C)   Ht 5\' 2"  (1.575 m)   Wt 165 lb (74.8 kg)   SpO2 95%   BMI 30.18 kg/m   Physical Examination:  General: Awake, alert, well nourished, No acute distress HEENT: Normal    Neck: No masses palpated. No lymphadenopathy    Ears: Tympanic membranes intact, normal light reflex, no erythema, no bulging    Eyes: PERRLA, extraocular membranes  intact, sclera white    Nose: nasal turbinates moist, clear nasal discharge    Throat: moist mucus membranes, mild oropharyngeal erythema, no tonsillar exudate.  Airway is patent Cardio: regular rate and rhythm, S1S2 heard, no murmurs appreciated Pulm: clear to auscultation bilaterally, no wheezes, rhonchi or rales; normal work of breathing on room air.  Coughing intermittently   DG Chest 2 View  Result Date: 05/14/2023 CLINICAL DATA:  Cough. EXAM: CHEST - 2 VIEW COMPARISON:  12/03/2017 FINDINGS: The heart size and mediastinal contours are within normal limits. Both lungs are clear. The visualized skeletal structures are unremarkable. IMPRESSION: No active cardiopulmonary disease. Electronically Signed   By: Danae Orleans M.D.   On: 05/14/2023 16:55    Assessment/ Plan: 73 y.o. female   URI with cough and congestion - Plan: Veritor Flu A/B Waived, Novel Coronavirus, NAA ([facility]), DG Chest 2 View, guaiFENesin-codeine 100-10 MG/5ML syrup, benzonatate (TESSALON PERLES) 100 MG capsule, azelastine (ASTELIN) 0.1 % nasal spray  Personal view of chest x-ray demonstrated no acute infiltrate suggestive of pneumonia and this was confirmed by radiology.  Appears to be a viral URI.  Negative for flu.  COVID pending.  I have sent over guaifenesin with codeine to help with not only the cough but some of the costochondritis from the cough.  Tessalon Perles given.  Caution sedation with the syrup.  Astelin nasal spray for drainage that is not relieved by Xyzal   Raliegh Ip, DO [facility] [phone]

## 2023-05-14 NOTE — Patient Instructions (Signed)

## 2023-05-15 ENCOUNTER — Encounter: Payer: Self-pay | Admitting: Family Medicine

## 2023-05-15 LAB — NOVEL CORONAVIRUS, NAA: SARS-CoV-2, NAA: NOT DETECTED

## 2023-05-31 ENCOUNTER — Encounter: Payer: Self-pay | Admitting: Family Medicine

## 2023-05-31 DIAGNOSIS — E119 Type 2 diabetes mellitus without complications: Secondary | ICD-10-CM

## 2023-05-31 MED ORDER — OZEMPIC (0.25 OR 0.5 MG/DOSE) 2 MG/3ML ~~LOC~~ SOPN: PEN_INJECTOR | SUBCUTANEOUS | 3 refills | Status: DC

## 2023-06-12 ENCOUNTER — Other Ambulatory Visit: Payer: Self-pay | Admitting: Family Medicine

## 2023-06-12 DIAGNOSIS — E1169 Type 2 diabetes mellitus with other specified complication: Secondary | ICD-10-CM

## 2023-06-14 ENCOUNTER — Other Ambulatory Visit: Payer: Self-pay | Admitting: Family Medicine

## 2023-06-14 DIAGNOSIS — E1165 Type 2 diabetes mellitus with hyperglycemia: Secondary | ICD-10-CM

## 2023-06-17 ENCOUNTER — Ambulatory Visit (INDEPENDENT_AMBULATORY_CARE_PROVIDER_SITE_OTHER)

## 2023-06-17 ENCOUNTER — Ambulatory Visit

## 2023-06-17 DIAGNOSIS — E119 Type 2 diabetes mellitus without complications: Secondary | ICD-10-CM

## 2023-06-17 DIAGNOSIS — Z7984 Long term (current) use of oral hypoglycemic drugs: Secondary | ICD-10-CM | POA: Diagnosis not present

## 2023-06-17 LAB — HM DIABETES EYE EXAM

## 2023-06-17 NOTE — Progress Notes (Signed)
 Nichole Cunningham arrived 06/17/2023 and has given verbal consent to obtain images and complete their overdue diabetic retinal screening.  The images have been sent to an ophthalmologist or optometrist for review and interpretation.  Results will be sent back to [name], DO for review.  Patient has been informed they will be contacted when we receive the results via telephone or MyChart

## 2023-06-20 ENCOUNTER — Ambulatory Visit

## 2023-06-24 ENCOUNTER — Encounter: Payer: Self-pay | Admitting: Family Medicine

## 2023-06-26 NOTE — Telephone Encounter (Signed)
 ASA 2: - BMP preop -Hold Januvia  morning of, per review of notes it appears she may be going back on Ozempic .  If she starts this back she will need to hold for 1 week.  Please verify with patient prior to scheduling procedure. -Half dose metformin  night prior and hold morning of procedure

## 2023-06-27 NOTE — Telephone Encounter (Signed)
 LMOVM to call back

## 2023-07-08 ENCOUNTER — Encounter (HOSPITAL_COMMUNITY): Payer: Self-pay

## 2023-07-10 ENCOUNTER — Encounter: Payer: Self-pay | Admitting: *Deleted

## 2023-07-10 MED ORDER — PEG 3350-KCL-NA BICARB-NACL 420 G PO SOLR: 4000.0000 mL | Freq: Once | ORAL | 0 refills | Status: AC

## 2023-07-10 NOTE — Addendum Note (Signed)
 Addended by: Feliz Hosteller on: 07/10/2023 10:46 AM   Modules accepted: Orders

## 2023-07-10 NOTE — Telephone Encounter (Signed)
 Referral completed, [facility] apt letter sent to PCP

## 2023-07-10 NOTE — Telephone Encounter (Signed)
 Pt called back and scheduled with Dr. Lary Point rfor 6/24 at 10:30am. Aware will send instructions and rx for prep to her pharmacy.

## 2023-07-15 ENCOUNTER — Ambulatory Visit (INDEPENDENT_AMBULATORY_CARE_PROVIDER_SITE_OTHER)

## 2023-07-15 ENCOUNTER — Encounter: Payer: Self-pay | Admitting: Family Medicine

## 2023-07-15 ENCOUNTER — Ambulatory Visit (INDEPENDENT_AMBULATORY_CARE_PROVIDER_SITE_OTHER): Payer: Medicare HMO | Admitting: Family Medicine

## 2023-07-15 VITALS — BP 124/80 | HR 70 | Temp 98.6°F | Ht 62.0 in | Wt 166.8 lb

## 2023-07-15 DIAGNOSIS — E785 Hyperlipidemia, unspecified: Secondary | ICD-10-CM

## 2023-07-15 DIAGNOSIS — Z7985 Long-term (current) use of injectable non-insulin antidiabetic drugs: Secondary | ICD-10-CM | POA: Diagnosis not present

## 2023-07-15 DIAGNOSIS — Z780 Asymptomatic menopausal state: Secondary | ICD-10-CM

## 2023-07-15 DIAGNOSIS — Z13820 Encounter for screening for osteoporosis: Secondary | ICD-10-CM

## 2023-07-15 DIAGNOSIS — E1169 Type 2 diabetes mellitus with other specified complication: Secondary | ICD-10-CM

## 2023-07-15 DIAGNOSIS — E119 Type 2 diabetes mellitus without complications: Secondary | ICD-10-CM | POA: Diagnosis not present

## 2023-07-15 LAB — BAYER DCA HB A1C WAIVED: HB A1C (BAYER DCA - WAIVED): 6.5 % — ABNORMAL HIGH (ref 4.8–5.6)

## 2023-07-15 MED ORDER — ROSUVASTATIN CALCIUM 10 MG PO TABS: 10.0000 mg | ORAL_TABLET | Freq: Every day | ORAL | 3 refills | Status: DC

## 2023-07-15 NOTE — Progress Notes (Signed)
 Subjective: CC:DM PCP: [name], DO Nichole Cunningham is a 73 y.o. female presenting to clinic today for:  1. Type 2 Diabetes with hyperlipidemia:  She was switched off Januvia  and back onto Ozempic  last visit. She reports that she has been doing okay with the medication.  She is only taking 0.25 mg weekly currently.  She reports no low blood sugars.  Average blood sugar between 120 and 130.  Diabetes Health Maintenance Due  Topic Date Due   HEMOGLOBIN A1C  10/13/2023   FOOT EXAM  12/12/2023   OPHTHALMOLOGY EXAM  06/16/2024    Last A1c:  Lab Results  Component Value Date   HGBA1C 7.6 (H) 04/15/2023    ROS: Denies dizziness, LOC, polyuria, polydipsia, unintended weight loss/gain, foot ulcerations, numbness or tingling in extremities, shortness of breath or chest pain.   ROS: Per HPI  No Known Allergies Past Medical History:  Diagnosis Date   Complication of anesthesia    Hyperlipidemia    Partial small bowel obstruction (HCC) 07/08/2022   PONV (postoperative nausea and vomiting)     Current Outpatient Medications:    acetaminophen  (TYLENOL ) 500 MG tablet, Take 500 mg by mouth every 6 (six) hours as needed., Disp: , Rfl:    augmented betamethasone dipropionate (DIPROLENE-AF) 0.05 % cream, Apply 1 Application topically 2 (two) times daily as needed (flare up of itching skin on legs)., Disp: , Rfl:    azelastine  (ASTELIN ) 0.1 % nasal spray, Place 1 spray into both nostrils 2 (two) times daily., Disp: 30 mL, Rfl: 12   benzonatate  (TESSALON  PERLES) 100 MG capsule, Take 1 capsule (100 mg total) by mouth 3 (three) times daily as needed., Disp: 20 capsule, Rfl: 0   Cholecalciferol 50 MCG (2000 UT) TABS, Take 1 tablet by mouth daily., Disp: , Rfl:    fenofibrate  160 MG tablet, TAKE 1 TABLET (160 MG TOTAL) DAILY BY MOUTH., Disp: 90 tablet, Rfl: 3   gabapentin  (NEURONTIN ) 300 MG capsule, Take 1 capsule (300 mg total) by mouth at bedtime as needed (burning leg pain at  night time)., Disp: 30 capsule, Rfl: 0   glucose blood (ONETOUCH VERIO) test strip, TEST BLOOD GLUCOSE DAILY AS NEEDED E11.9 (ONE TOUCH VERIO REFLECT), Disp: 100 strip, Rfl: 3   guaiFENesin -codeine  100-10 MG/5ML syrup, Take 5 mLs by mouth every 6 (six) hours as needed for cough., Disp: 120 mL, Rfl: 0   Lancet Device MISC, Test BGs once daily as needed E11.9 (one touch verio reflect), Disp: 100 each, Rfl: 12   levocetirizine (XYZAL ) 5 MG tablet, Take 1 tablet (5 mg total) by mouth at bedtime as needed for allergies. For allergies/ drainage, Disp: 90 tablet, Rfl: 3   meclizine  (ANTIVERT ) 25 MG tablet, Take 1 tablet (25 mg total) by mouth 3 (three) times daily as needed for dizziness., Disp: 30 tablet, Rfl: 0   meloxicam  (MOBIC ) 7.5 MG tablet, Take 1 tablet (7.5 mg total) by mouth daily as needed for pain (take with food.)., Disp: 30 tablet, Rfl: 0   metFORMIN  (GLUCOPHAGE ) 500 MG tablet, TAKE 1 TABLET BY MOUTH TWICE A DAY WITH FOOD, Disp: 180 tablet, Rfl: 0   Omega-3 Fatty Acids (OMEGA 3 PO), Take 1 tablet by mouth daily., Disp: , Rfl:    rosuvastatin  (CRESTOR ) 10 MG tablet, TAKE 1 TABLET BY MOUTH EVERY DAY, Disp: 90 tablet, Rfl: 0   Semaglutide ,0.25 or 0.5MG /DOS, (OZEMPIC , 0.25 OR 0.5 MG/DOSE,) 2 MG/3ML SOPN, Inject 0.25 mg into the skin every 7 (seven) days  for 28 days, THEN 0.5 mg every 7 (seven) days., Disp: 9 mL, Rfl: 3   tiZANidine  (ZANAFLEX ) 4 MG tablet, Take 1 tablet (4 mg total) by mouth every 8 (eight) hours as needed for muscle spasms., Disp: 30 tablet, Rfl: 0 Social History   Socioeconomic History   Marital status: Married    Spouse name: Charles   Number of children: 2   Years of education: 12   Highest education level: High school graduate  Occupational History   Occupation: retired  Tobacco Use   Smoking status: Never   Smokeless tobacco: Never  Vaping Use   Vaping status: Never Used  Substance and Sexual Activity   Alcohol use: No   Drug use: No   Sexual activity: Yes     Birth control/protection: Post-menopausal  Other Topics Concern   Not on file  Social History Narrative   Not on file   Social Drivers of Health   Financial Resource Strain: Low Risk  (06/20/2022)   Overall Financial Resource Strain (CARDIA)    Difficulty of Paying Living Expenses: Not hard at all  Food Insecurity: No Food Insecurity (07/08/2022)   Hunger Vital Sign    Worried About Running Out of Food in the Last Year: Never true    Ran Out of Food in the Last Year: Never true  Transportation Needs: No Transportation Needs (07/08/2022)   PRAPARE - Administrator, Civil Service (Medical): No    Lack of Transportation (Non-Medical): No  Physical Activity: Insufficiently Active (06/20/2022)   Exercise Vital Sign    Days of Exercise per Week: 3 days    Minutes of Exercise per Session: 30 min  Stress: No Stress Concern Present (06/20/2022)   [employer] of Occupational Health - Occupational Stress Questionnaire    Feeling of Stress : Not at all  Social Connections: Socially Integrated (06/20/2022)   Social Connection and Isolation Panel [NHANES]    Frequency of Communication with Friends and Family: More than three times a week    Frequency of Social Gatherings with Friends and Family: More than three times a week    Attends Religious Services: More than 4 times per year    Active Member of [employer] or Organizations: Yes    Attends Engineer, structural: More than 4 times per year    Marital Status: Married  Catering manager Violence: Not At Risk (07/08/2022)   Humiliation, Afraid, Rape, and Kick questionnaire    Fear of Current or Ex-Partner: No    Emotionally Abused: No    Physically Abused: No    Sexually Abused: No   Family History  Problem Relation Age of Onset   Breast cancer Maternal Aunt    Cancer Mother    Heart disease Father    Heart attack Father        cause of death   Cancer Sister    Colon cancer Sister        dx 2020   Cancer Brother         metastatic w/ uncertain primary dx 2020   Anesthesia problems Neg Hx    Hypotension Neg Hx    Malignant hyperthermia Neg Hx    Pseudochol deficiency Neg Hx    Stomach cancer Neg Hx     Objective: Office vital signs reviewed. BP 124/80   Pulse 70   Temp 98.6 F (37 C)   Ht 5\' 2"  (1.575 m)   Wt 166 lb 12.8 oz (75.7 kg)  SpO2 96%   BMI 30.51 kg/m   Physical Examination:  General: Awake, alert, well nourished, No acute distress HEENT: Sclera white.  Moist mucous membranes Cardio: regular rate and rhythm, S1S2 heard, no murmurs appreciated Pulm: clear to auscultation bilaterally, no wheezes, rhonchi or rales; normal work of breathing on room air  Assessment/ Plan: 73 y.o. female   Diabetes mellitus treated with injections of non-insulin medication (HCC) - Plan: Bayer DCA Hb A1c Waived, CMP14+EGFR  Hyperlipidemia associated with type 2 diabetes mellitus (HCC) - Plan: CMP14+EGFR, rosuvastatin  (CRESTOR ) 10 MG tablet  Encounter for osteoporosis screening in asymptomatic postmenopausal patient - Plan: DG [facility] DEXA  Sugar under good control.  Go ahead and advance to 0.5 mg weekly of Ozempic .  Continue statin.  Lipid panel not yet due.  Check renal function.  DEXA scan ordered and will be completed today  [name], DO [facility] [phone]

## 2023-07-16 ENCOUNTER — Ambulatory Visit: Payer: Self-pay | Admitting: Family Medicine

## 2023-07-16 DIAGNOSIS — E875 Hyperkalemia: Secondary | ICD-10-CM

## 2023-07-16 LAB — CMP14+EGFR
ALT: 27 IU/L (ref 0–32)
AST: 32 IU/L (ref 0–40)
Albumin: 4.8 g/dL (ref 3.8–4.8)
Alkaline Phosphatase: 72 IU/L (ref 44–121)
BUN/Creatinine Ratio: 18 (ref 12–28)
BUN: 18 mg/dL (ref 8–27)
Bilirubin Total: 0.8 mg/dL (ref 0.0–1.2)
CO2: 21 mmol/L (ref 20–29)
Calcium: 10.5 mg/dL — ABNORMAL HIGH (ref 8.7–10.3)
Chloride: 103 mmol/L (ref 96–106)
Creatinine, Ser: 1 mg/dL (ref 0.57–1.00)
Globulin, Total: 2.2 g/dL (ref 1.5–4.5)
Glucose: 119 mg/dL — ABNORMAL HIGH (ref 70–99)
Potassium: 5.5 mmol/L — ABNORMAL HIGH (ref 3.5–5.2)
Sodium: 142 mmol/L (ref 134–144)
Total Protein: 7 g/dL (ref 6.0–8.5)
eGFR: 60 mL/min/{1.73_m2} (ref >59)

## 2023-07-19 ENCOUNTER — Other Ambulatory Visit

## 2023-07-19 DIAGNOSIS — M85851 Other specified disorders of bone density and structure, right thigh: Secondary | ICD-10-CM | POA: Diagnosis not present

## 2023-07-19 DIAGNOSIS — Z780 Asymptomatic menopausal state: Secondary | ICD-10-CM | POA: Diagnosis not present

## 2023-07-19 DIAGNOSIS — M85852 Other specified disorders of bone density and structure, left thigh: Secondary | ICD-10-CM | POA: Diagnosis not present

## 2023-07-19 DIAGNOSIS — E875 Hyperkalemia: Secondary | ICD-10-CM

## 2023-07-19 LAB — BASIC METABOLIC PANEL WITH GFR
BUN/Creatinine Ratio: 21 (ref 12–28)
BUN: 19 mg/dL (ref 8–27)
CO2: 20 mmol/L (ref 20–29)
Calcium: 9.9 mg/dL (ref 8.7–10.3)
Chloride: 104 mmol/L (ref 96–106)
Creatinine, Ser: 0.89 mg/dL (ref 0.57–1.00)
Glucose: 104 mg/dL — ABNORMAL HIGH (ref 70–99)
Potassium: 4.4 mmol/L (ref 3.5–5.2)
Sodium: 142 mmol/L (ref 134–144)
eGFR: 69 mL/min/{1.73_m2} (ref >59)

## 2023-07-23 ENCOUNTER — Ambulatory Visit: Payer: Self-pay | Admitting: Family Medicine

## 2023-07-24 ENCOUNTER — Other Ambulatory Visit: Payer: Self-pay | Admitting: Family Medicine

## 2023-07-24 DIAGNOSIS — J069 Acute upper respiratory infection, unspecified: Secondary | ICD-10-CM

## 2023-08-20 ENCOUNTER — Encounter (HOSPITAL_COMMUNITY)
Admission: RE | Disposition: A | Discharge status: Home / Self Care | Payer: Self-pay | Source: Home / Self Care | Attending: Internal Medicine

## 2023-08-20 ENCOUNTER — Ambulatory Visit (HOSPITAL_COMMUNITY)
Admission: RE | Admit: 2023-08-20 | Discharge: 2023-08-20 | Disposition: A | Discharge status: Home / Self Care | Attending: Internal Medicine | Admitting: Internal Medicine

## 2023-08-20 ENCOUNTER — Ambulatory Visit (HOSPITAL_BASED_OUTPATIENT_CLINIC_OR_DEPARTMENT_OTHER): Payer: Self-pay | Admitting: Anesthesiology

## 2023-08-20 ENCOUNTER — Encounter (HOSPITAL_COMMUNITY): Payer: Self-pay | Admitting: Internal Medicine

## 2023-08-20 ENCOUNTER — Other Ambulatory Visit: Payer: Self-pay

## 2023-08-20 ENCOUNTER — Ambulatory Visit (HOSPITAL_COMMUNITY): Payer: Self-pay | Admitting: Anesthesiology

## 2023-08-20 DIAGNOSIS — I10 Essential (primary) hypertension: Secondary | ICD-10-CM | POA: Diagnosis not present

## 2023-08-20 DIAGNOSIS — Z1211 Encounter for screening for malignant neoplasm of colon: Secondary | ICD-10-CM

## 2023-08-20 DIAGNOSIS — Z800 Family history of malignant neoplasm of digestive organs: Secondary | ICD-10-CM

## 2023-08-20 DIAGNOSIS — K644 Residual hemorrhoidal skin tags: Secondary | ICD-10-CM | POA: Insufficient documentation

## 2023-08-20 DIAGNOSIS — E119 Type 2 diabetes mellitus without complications: Secondary | ICD-10-CM | POA: Diagnosis not present

## 2023-08-20 DIAGNOSIS — D122 Benign neoplasm of ascending colon: Secondary | ICD-10-CM | POA: Insufficient documentation

## 2023-08-20 DIAGNOSIS — D123 Benign neoplasm of transverse colon: Secondary | ICD-10-CM | POA: Diagnosis not present

## 2023-08-20 DIAGNOSIS — K635 Polyp of colon: Secondary | ICD-10-CM

## 2023-08-20 DIAGNOSIS — D175 Benign lipomatous neoplasm of intra-abdominal organs: Secondary | ICD-10-CM | POA: Diagnosis not present

## 2023-08-20 DIAGNOSIS — Z139 Encounter for screening, unspecified: Secondary | ICD-10-CM | POA: Diagnosis not present

## 2023-08-20 DIAGNOSIS — K648 Other hemorrhoids: Secondary | ICD-10-CM | POA: Diagnosis not present

## 2023-08-20 DIAGNOSIS — Z7985 Long-term (current) use of injectable non-insulin antidiabetic drugs: Secondary | ICD-10-CM | POA: Insufficient documentation

## 2023-08-20 HISTORY — PX: COLONOSCOPY: SHX5424

## 2023-08-20 LAB — GLUCOSE, CAPILLARY: Glucose-Capillary: 110 mg/dL — ABNORMAL HIGH (ref 70–99)

## 2023-08-20 SURGERY — COLONOSCOPY
Anesthesia: General

## 2023-08-20 MED ORDER — LACTATED RINGERS IV SOLN: INTRAVENOUS | Status: DC

## 2023-08-20 MED ORDER — PROPOFOL 500 MG/50ML IV EMUL
INTRAVENOUS | Status: DC | PRN
  Administered 2023-08-20: 100 mg via INTRAVENOUS
  Administered 2023-08-20: 150 ug/kg/min via INTRAVENOUS

## 2023-08-20 NOTE — Op Note (Signed)
 [facility] Patient Name: Nichole Cunningham Procedure Date: 08/20/2023 10:06 AM MRN: [id number] Date of Birth: 1950-05-19 Attending MD: Carlin POUR. Cindie , [place], [id number] CSN: [id number] Age: 73 Admit Type: Outpatient Procedure:                Colonoscopy Indications:              Screening for colorectal malignant neoplasm Providers:                Carlin POUR. Cindie, DO, [address] [place]                            Goins, Kristine L. Shirlean Balm, Technician Referring MD:              Medicines:                See the Anesthesia note for documentation of the                            administered medications Complications:            No immediate complications. Estimated Blood Loss:     Estimated blood loss was minimal. Procedure:                Pre-Anesthesia Assessment:                           - The anesthesia plan was to use monitored                            anesthesia care (MAC).                           After obtaining informed consent, the colonoscope                            was passed under direct vision. Throughout the                            procedure, the patient's blood pressure, pulse, and                            oxygen saturations were monitored continuously. The                            [id number]-HQ190L ([id number]) scope was introduced through                            the anus and advanced to the the cecum, identified                            by appendiceal orifice and ileocecal valve. The                            colonoscopy was performed without difficulty. The                            patient tolerated  the procedure well. The quality                            of the bowel preparation was evaluated using the                            BBPS [facility] Bowel Preparation Scale) with scores                            of: Right Colon = 3, Transverse Colon = 3 and Left                            Colon = 3 (entire mucosa seen well with no residual                             staining, small fragments of stool or opaque                            liquid). The total BBPS score equals 9. Scope In: 10:23:59 AM Scope Out: 10:36:33 AM Scope Withdrawal Time: 0 hours 10 minutes 30 seconds  Total Procedure Duration: 0 hours 12 minutes 34 seconds  Findings:      Hemorrhoids were found on perianal exam.      Three sessile polyps were found in the ascending colon. The polyps were       4 to 5 mm in size. These polyps were removed with a cold snare.       Resection and retrieval were complete.      Three sessile polyps were found in the transverse colon. The polyps were       4 to 6 mm in size. These polyps were removed with a cold snare.       Resection and retrieval were complete.      There was a medium-sized lipoma, in the transverse colon.      The terminal ileum appeared normal. Impression:               - Hemorrhoids found on perianal exam.                           - Three 4 to 5 mm polyps in the ascending colon,                            removed with a cold snare. Resected and retrieved.                           - Three 4 to 6 mm polyps in the transverse colon,                            removed with a cold snare. Resected and retrieved.                           - Medium-sized lipoma in the transverse colon.                           -  The examined portion of the ileum was normal. Moderate Sedation:      Per Anesthesia Care Recommendation:           - Patient has a contact number available for                            emergencies. The signs and symptoms of potential                            delayed complications were discussed with the                            patient. Return to normal activities tomorrow.                            Written discharge instructions were provided to the                            patient.                           - Resume previous diet.                           - Continue present medications.                            - Await pathology results.                           - Repeat colonoscopy in 3 - 5 years for                            surveillance.                           - Return to GI clinic PRN. Procedure Code(s):        --- Professional ---                           [phone], Colonoscopy, flexible; with removal of                            tumor(s), polyp(s), or other lesion(s) by snare                            technique Diagnosis Code(s):        --- Professional ---                           Z12.11, Encounter for screening for malignant                            neoplasm of colon                           D12.2, Benign neoplasm of ascending colon  D12.3, Benign neoplasm of transverse colon (hepatic                            flexure or splenic flexure)                           D17.5, Benign lipomatous neoplasm of                            intra-abdominal organs                           K64.9, Unspecified hemorrhoids CPT copyright 2022 American Medical Association. All rights reserved. The codes documented in this report are preliminary and upon coder review may  be revised to meet current compliance requirements. Carlin POUR. Cindie, DO Carlin POUR. Cindie, DO 08/20/2023 10:40:19 AM This report has been signed electronically. Number of Addenda: 0

## 2023-08-20 NOTE — H&P (Signed)
 Primary Care Physician:  Jolinda Norene HERO, DO Primary Gastroenterologist:  Dr. Cindie  Pre-Procedure History & Physical: HPI:  Nichole Cunningham is a 73 y.o. female is here for a colonoscopy for colon cancer screening purposes.  Patient reports family history of colon cancer in sister age 46.  No melena or hematochezia.  No abdominal pain or unintentional weight loss.  No change in bowel habits.  Overall feels well from a GI standpoint.  Past Medical History:  Diagnosis Date   Complication of anesthesia    Hyperlipidemia    Partial small bowel obstruction (HCC) 07/08/2022   PONV (postoperative nausea and vomiting)     Past Surgical History:  Procedure Laterality Date   CHOLECYSTECTOMY  04/13/2011   Procedure: LAPAROSCOPIC CHOLECYSTECTOMY;  Surgeon: Thresa JAYSON Pulling, MD;  Location: [facility];  Service: General;  Laterality: N/A;   HEMORRHOID SURGERY  2003   [place]   SKIN LESION EXCISION  02/2018   mole removed from forehead     Prior to Admission medications   Medication Sig Start Date End Date Taking? Authorizing Provider  acetaminophen  (TYLENOL ) 500 MG tablet Take 500 mg by mouth every 6 (six) hours as needed.   Yes [provider]  Cholecalciferol 50 MCG (2000 UT) TABS Take 1 tablet by mouth daily.   Yes [provider]  fenofibrate  160 MG tablet TAKE 1 TABLET (160 MG TOTAL) DAILY BY MOUTH. 04/15/23  Yes Gottschalk, Ashly M, DO  glucose blood (ONETOUCH VERIO) test strip TEST BLOOD GLUCOSE DAILY AS NEEDED E11.9 (ONE TOUCH VERIO REFLECT) 05/15/22  Yes Jolinda Norene HERO, DO  Lancet Device MISC Test BGs once daily as needed E11.9 (one touch verio reflect) 05/15/22  Yes Gottschalk, Ashly M, DO  Omega-3 Fatty Acids (OMEGA 3 PO) Take 1 tablet by mouth daily.   Yes [provider]  rosuvastatin  (CRESTOR ) 10 MG tablet Take 1 tablet (10 mg total) by mouth daily. 07/15/23  Yes Gottschalk, Norene M, DO  tiZANidine  (ZANAFLEX ) 4 MG tablet Take 1 tablet (4 mg total) by  mouth every 8 (eight) hours as needed for muscle spasms. 12/12/22  Yes Gottschalk, Norene M, DO  augmented betamethasone dipropionate (DIPROLENE-AF) 0.05 % cream Apply 1 Application topically 2 (two) times daily as needed (flare up of itching skin on legs). 04/16/22   [provider]  azelastine  (ASTELIN ) 0.1 % nasal spray Place 1 spray into both nostrils 2 (two) times daily. 05/14/23   Jolinda Norene HERO, DO  levocetirizine (XYZAL ) 5 MG tablet Take 1 tablet (5 mg total) by mouth at bedtime as needed for allergies. For allergies/ drainage 08/08/22   Jolinda Norene M, DO  Semaglutide ,0.25 or 0.5MG /DOS, (OZEMPIC , 0.25 OR 0.5 MG/DOSE,) 2 MG/3ML SOPN Inject 0.25 mg into the skin every 7 (seven) days for 28 days, THEN 0.5 mg every 7 (seven) days. 05/31/23 06/27/24  Jolinda Norene HERO, DO    Allergies as of 07/10/2023   (No Known Allergies)    Family History  Problem Relation Age of Onset   Breast cancer Maternal Aunt    Cancer Mother    Heart disease Father    Heart attack Father        cause of death   Cancer Sister    Colon cancer Sister        dx 2020   Cancer Brother        metastatic w/ uncertain primary dx 2020   Anesthesia problems Neg Hx    Hypotension Neg Hx  Malignant hyperthermia Neg Hx    Pseudochol deficiency Neg Hx    Stomach cancer Neg Hx     Social History   Socioeconomic History   Marital status: Married    Spouse name: Karima Carrell   Number of children: 2   Years of education: 12   Highest education level: High school graduate  Occupational History   Occupation: retired  Tobacco Use   Smoking status: Never   Smokeless tobacco: Never  Vaping Use   Vaping status: Never Used  Substance and Sexual Activity   Alcohol use: No   Drug use: No   Sexual activity: Yes    Birth control/protection: Post-menopausal  Other Topics Concern   Not on file  Social History Narrative   Not on file   Social Drivers of Health   Financial Resource Strain: Low Risk   (06/20/2022)   Overall Financial Resource Strain (CARDIA)    Difficulty of Paying Living Expenses: Not hard at all  Food Insecurity: No Food Insecurity (07/08/2022)   Hunger Vital Sign    Worried About Running Out of Food in the Last Year: Never true    Ran Out of Food in the Last Year: Never true  Transportation Needs: No Transportation Needs (07/08/2022)   PRAPARE - Administrator, Civil Service (Medical): No    Lack of Transportation (Non-Medical): No  Physical Activity: Insufficiently Active (06/20/2022)   Exercise Vital Sign    Days of Exercise per Week: 3 days    Minutes of Exercise per Session: 30 min  Stress: No Stress Concern Present (06/20/2022)   [employer] of Occupational Health - Occupational Stress Questionnaire    Feeling of Stress : Not at all  Social Connections: Socially Integrated (06/20/2022)   Social Connection and Isolation Panel    Frequency of Communication with Friends and Family: More than three times a week    Frequency of Social Gatherings with Friends and Family: More than three times a week    Attends Religious Services: More than 4 times per year    Active Member of Clubs or Organizations: Yes    Attends Banker Meetings: More than 4 times per year    Marital Status: Married  Catering manager Violence: Not At Risk (07/08/2022)   Humiliation, Afraid, Rape, and Kick questionnaire    Fear of Current or Ex-Partner: No    Emotionally Abused: No    Physically Abused: No    Sexually Abused: No    Review of Systems: See HPI, otherwise negative ROS  Physical Exam: Vital signs in last 24 hours: Temp:  [97.9 F (36.6 C)] 97.9 F (36.6 C) (06/24 0901) Pulse Rate:  [87] 87 (06/24 0901) Resp:  [12] 12 (06/24 0901) BP: (155)/(92) 155/92 (06/24 0901) SpO2:  [94 %] 94 % (06/24 0901) Weight:  [72.6 kg] 72.6 kg (06/24 0901)   General:   Alert,  Well-developed, well-nourished, pleasant and cooperative in NAD Head:  Normocephalic and  atraumatic. Eyes:  Sclera clear, no icterus.   Conjunctiva pink. Ears:  Normal auditory acuity. Nose:  No deformity, discharge,  or lesions. Msk:  Symmetrical without gross deformities. Normal posture. Extremities:  Without clubbing or edema. Neurologic:  Alert and  oriented x4;  grossly normal neurologically. Skin:  Intact without significant lesions or rashes. Psych:  Alert and cooperative. Normal mood and affect.  Impression/Plan: Nichole Cunningham is here for a colonoscopy to be performed for colon cancer screening purposes.  The risks of the procedure  including infection, bleed, or perforation as well as benefits, limitations, alternatives and imponderables have been reviewed with the patient. Questions have been answered. All parties agreeable.

## 2023-08-20 NOTE — Anesthesia Preprocedure Evaluation (Signed)
 Anesthesia Evaluation  Patient identified by MRN, date of birth, ID band Patient awake    Reviewed: Allergy & Precautions, H&P , NPO status , Patient's Chart, lab work & pertinent test results, reviewed documented beta blocker date and time   History of Anesthesia Complications (+) PONV and history of anesthetic complications  Airway Mallampati: II  TM Distance: >3 FB Neck ROM: full    Dental no notable dental hx.    Pulmonary neg pulmonary ROS   Pulmonary exam normal breath sounds clear to auscultation       Cardiovascular Exercise Tolerance: Good hypertension, negative cardio ROS  Rhythm:regular Rate:Normal     Neuro/Psych negative neurological ROS  negative psych ROS   GI/Hepatic negative GI ROS, Neg liver ROS,,,  Endo/Other  diabetes    Renal/GU negative Renal ROS  negative genitourinary   Musculoskeletal   Abdominal   Peds  Hematology negative hematology ROS (+)   Anesthesia Other Findings   Reproductive/Obstetrics negative OB ROS                             Anesthesia Physical Anesthesia Plan  ASA: 2  Anesthesia Plan: General   Post-op Pain Management:    Induction:   PONV Risk Score and Plan: Propofol  infusion  Airway Management Planned:   Additional Equipment:   Intra-op Plan:   Post-operative Plan:   Informed Consent: I have reviewed the patients History and Physical, chart, labs and discussed the procedure including the risks, benefits and alternatives for the proposed anesthesia with the patient or authorized representative who has indicated his/her understanding and acceptance.     Dental Advisory Given  Plan Discussed with: CRNA  Anesthesia Plan Comments:        Anesthesia Quick Evaluation

## 2023-08-20 NOTE — Transfer of Care (Signed)
 Immediate Anesthesia Transfer of Care Note  Patient: Nichole Cunningham  Procedure(s) Performed: COLONOSCOPY  Patient Location: Endoscopy Unit  Anesthesia Type:General  Level of Consciousness: awake  Airway & Oxygen Therapy: Patient Spontanous Breathing  Post-op Assessment: Report given to RN and Post -op Vital signs reviewed and stable  Post vital signs: Reviewed and stable  Last Vitals:  Vitals Value Taken Time  BP 110/65 08/20/23 10:39  Temp 36.4 C 08/20/23 10:39  Pulse 79 08/20/23 10:39  Resp 20 08/20/23 10:39  SpO2 98 % 08/20/23 10:39    Last Pain:  Vitals:   08/20/23 1039  TempSrc: Oral  PainSc: 0-No pain      Patients Stated Pain Goal: 5 (08/20/23 0901)  Complications: No notable events documented.

## 2023-08-20 NOTE — Discharge Instructions (Addendum)
  Colonoscopy Discharge Instructions  Read the instructions outlined below and refer to this sheet in the next few weeks. These discharge instructions provide you with general information on caring for yourself after you leave the hospital. Your doctor may also give you specific instructions. While your treatment has been planned according to the most current medical practices available, unavoidable complications occasionally occur.   ACTIVITY You may resume your regular activity, but move at a slower pace for the next 24 hours.  Take frequent rest periods for the next 24 hours.  Walking will help get rid of the air and reduce the bloated feeling in your belly (abdomen).  No driving for 24 hours (because of the medicine (anesthesia) used during the test).   Do not sign any important legal documents or operate any machinery for 24 hours (because of the anesthesia used during the test).  NUTRITION Drink plenty of fluids.  You may resume your normal diet as instructed by your doctor.  Begin with a light meal and progress to your normal diet. Heavy or fried foods are harder to digest and may make you feel sick to your stomach (nauseated).  Avoid alcoholic beverages for 24 hours or as instructed.  MEDICATIONS You may resume your normal medications unless your doctor tells you otherwise.  WHAT YOU CAN EXPECT TODAY Some feelings of bloating in the abdomen.  Passage of more gas than usual.  Spotting of blood in your stool or on the toilet paper.  IF YOU HAD POLYPS REMOVED DURING THE COLONOSCOPY: No aspirin products for 7 days or as instructed.  No alcohol for 7 days or as instructed.  Eat a soft diet for the next 24 hours.  FINDING OUT THE RESULTS OF YOUR TEST Not all test results are available during your visit. If your test results are not back during the visit, make an appointment with your caregiver to find out the results. Do not assume everything is normal if you have not heard from your  caregiver or the medical facility. It is important for you to follow up on all of your test results.  SEEK IMMEDIATE MEDICAL ATTENTION IF: You have more than a spotting of blood in your stool.  Your belly is swollen (abdominal distention).  You are nauseated or vomiting.  You have a temperature over 101.  You have abdominal pain or discomfort that is severe or gets worse throughout the day.   Your colonoscopy revealed 6 polyp(s) which I removed successfully. Await pathology results, my office will contact you. I recommend repeating colonoscopy in 3-5 years for surveillance purposes depending on pathology results.   Otherwise follow up with GI as needed.  I hope you have a great rest of your week!  Hennie Duos. Marletta Lor, D.O. Gastroenterology and Hepatology [facility]

## 2023-08-21 ENCOUNTER — Encounter (HOSPITAL_COMMUNITY): Payer: Self-pay | Admitting: Internal Medicine

## 2023-08-21 LAB — SURGICAL PATHOLOGY

## 2023-08-21 NOTE — Anesthesia Postprocedure Evaluation (Signed)
 Anesthesia Post Note  Patient: Nichole Cunningham  Procedure(s) Performed: COLONOSCOPY  Patient location during evaluation: Phase II Anesthesia Type: General Level of consciousness: awake Pain management: pain level controlled Vital Signs Assessment: post-procedure vital signs reviewed and stable Respiratory status: spontaneous breathing and respiratory function stable Cardiovascular status: blood pressure returned to baseline and stable Postop Assessment: no headache and no apparent nausea or vomiting Anesthetic complications: no Comments: Late entry   No notable events documented.   Last Vitals:  Vitals:   08/20/23 0901 08/20/23 1039  BP: (!) 155/92 110/65  Pulse: 87 79  Resp: 12 20  Temp: 36.6 C 36.4 C  SpO2: 94% 98%    Last Pain:  Vitals:   08/20/23 1039  TempSrc: Oral  PainSc: 0-No pain                 Yvonna JINNY Bosworth

## 2023-08-27 ENCOUNTER — Ambulatory Visit: Payer: Self-pay | Admitting: Internal Medicine

## 2023-09-02 ENCOUNTER — Encounter: Payer: Self-pay | Admitting: Internal Medicine

## 2023-09-06 ENCOUNTER — Ambulatory Visit (INDEPENDENT_AMBULATORY_CARE_PROVIDER_SITE_OTHER)

## 2023-09-06 VITALS — Ht 63.0 in | Wt 165.0 lb

## 2023-09-06 DIAGNOSIS — Z0000 Encounter for general adult medical examination without abnormal findings: Secondary | ICD-10-CM | POA: Diagnosis not present

## 2023-09-06 DIAGNOSIS — Z1231 Encounter for screening mammogram for malignant neoplasm of breast: Secondary | ICD-10-CM

## 2023-09-06 NOTE — Progress Notes (Signed)
 Mammogram scheduled October 07, 2023 on mobile mammogram bus Subjective:   Nichole Cunningham is a 73 y.o. who presents for a Medicare Wellness preventive visit.  As a reminder, Annual Wellness Visits don't include a physical exam, and some assessments may be limited, especially if this visit is performed virtually. We may recommend an in-person follow-up visit with your provider if needed.  Visit Complete: Virtual I connected with  Nichole Cunningham on 09/06/23 by a audio enabled telemedicine application and verified that I am speaking with the correct person using two identifiers.  Patient Location: Home  Provider Location: Home Office  I discussed the limitations of evaluation and management by telemedicine. The patient expressed understanding and agreed to proceed.  Vital Signs: Because this visit was a virtual/telehealth visit, some criteria may be missing or patient reported. Any vitals not documented were not able to be obtained and vitals that have been documented are patient reported.  VideoDeclined- This patient declined Librarian, academic. Therefore the visit was completed with audio only.  Persons Participating in Visit: Patient.  AWV Questionnaire: No: Patient Medicare AWV questionnaire was not completed prior to this visit.  Cardiac Risk Factors include: advanced age (>13men, >27 women);diabetes mellitus;dyslipidemia;sedentary lifestyle     Objective:    Today's Vitals   09/06/23 1124 09/06/23 1125  Weight: 165 lb (74.8 kg)   Height: 5' 3 (1.6 m)   PainSc:  0-No pain   Body mass index is 29.23 kg/m.     09/06/2023   11:21 AM 08/20/2023    8:49 AM 07/08/2022   11:46 PM 07/08/2022    7:24 PM 06/20/2022    2:57 PM 10/10/2021    8:29 AM 03/09/2021    3:28 PM  Advanced Directives  Does Patient Have a Medical Advance Directive? No No Yes Yes Yes Yes No  Type of Advance Directive   Living will;Healthcare Power of Attorney Living will;Healthcare  Power of Attorney Living will;Healthcare Power of [employer] Power of [place];Living will   Does patient want to make changes to medical advance directive?   No - Patient declined  No - Patient declined    Copy of Healthcare Power of Attorney in Chart?   No - copy requested No - copy requested No - copy requested No - copy requested   Would patient like information on creating a medical advance directive? Yes (MAU/Ambulatory/Procedural Areas - Information given) No - Patient declined         Current Medications (verified) Outpatient Encounter Medications as of 09/06/2023  Medication Sig   acetaminophen  (TYLENOL ) 500 MG tablet Take 500 mg by mouth every 6 (six) hours as needed.   augmented betamethasone dipropionate (DIPROLENE-AF) 0.05 % cream Apply 1 Application topically 2 (two) times daily as needed (flare up of itching skin on legs).   Cholecalciferol 50 MCG (2000 UT) TABS Take 1 tablet by mouth daily.   fenofibrate  160 MG tablet TAKE 1 TABLET (160 MG TOTAL) DAILY BY MOUTH.   glucose blood (ONETOUCH VERIO) test strip TEST BLOOD GLUCOSE DAILY AS NEEDED E11.9 (ONE TOUCH VERIO REFLECT)   Lancet Device MISC Test BGs once daily as needed E11.9 (one touch verio reflect)   levocetirizine (XYZAL ) 5 MG tablet Take 1 tablet (5 mg total) by mouth at bedtime as needed for allergies. For allergies/ drainage   rosuvastatin  (CRESTOR ) 10 MG tablet Take 1 tablet (10 mg total) by mouth daily.   Semaglutide ,0.25 or 0.5MG /DOS, (OZEMPIC , 0.25 OR 0.5 MG/DOSE,) 2 MG/3ML SOPN  Inject 0.25 mg into the skin every 7 (seven) days for 28 days, THEN 0.5 mg every 7 (seven) days.   tiZANidine  (ZANAFLEX ) 4 MG tablet Take 1 tablet (4 mg total) by mouth every 8 (eight) hours as needed for muscle spasms.   azelastine  (ASTELIN ) 0.1 % nasal spray Place 1 spray into both nostrils 2 (two) times daily. (Patient not taking: Reported on 09/06/2023)   Omega-3 Fatty Acids (OMEGA 3 PO) Take 1 tablet by mouth daily. (Patient not  taking: Reported on 09/06/2023)   No facility-administered encounter medications on file as of 09/06/2023.    Allergies (verified) Patient has no known allergies.   History: Past Medical History:  Diagnosis Date   Complication of anesthesia    Hyperlipidemia    Partial small bowel obstruction (HCC) 07/08/2022   PONV (postoperative nausea and vomiting)    Past Surgical History:  Procedure Laterality Date   CHOLECYSTECTOMY  04/13/2011   Procedure: LAPAROSCOPIC CHOLECYSTECTOMY;  Surgeon: Thresa JAYSON Pulling, MD;  Location: [facility];  Service: General;  Laterality: N/A;   COLONOSCOPY N/A 08/20/2023   Procedure: COLONOSCOPY;  Surgeon: Cindie Carlin POUR, DO;  Location: [facility];  Service: Endoscopy;  Laterality: N/A;  10:30am, asa 2   HEMORRHOID SURGERY  2003   [place]   SKIN LESION EXCISION  02/2018   mole removed from forehead    Family History  Problem Relation Age of Onset   Breast cancer Maternal Aunt    Cancer Mother    Heart disease Father    Heart attack Father        cause of death   Cancer Sister    Colon cancer Sister        dx 2020   Cancer Brother        metastatic w/ uncertain primary dx 2020   Anesthesia problems Neg Hx    Hypotension Neg Hx    Malignant hyperthermia Neg Hx    Pseudochol deficiency Neg Hx    Stomach cancer Neg Hx    Social History   Socioeconomic History   Marital status: Married    Spouse name: Charles   Number of children: 2   Years of education: 12   Highest education level: High school graduate  Occupational History   Occupation: retired  Tobacco Use   Smoking status: Never   Smokeless tobacco: Never  Vaping Use   Vaping status: Never Used  Substance and Sexual Activity   Alcohol use: No   Drug use: No   Sexual activity: Yes    Birth control/protection: Post-menopausal  Other Topics Concern   Not on file  Social History Narrative   Not on file   Social Drivers of Health   Financial Resource Strain: Low Risk   (09/06/2023)   Overall Financial Resource Strain (CARDIA)    Difficulty of Paying Living Expenses: Not hard at all  Food Insecurity: No Food Insecurity (09/06/2023)   Hunger Vital Sign    Worried About Running Out of Food in the Last Year: Never true    Ran Out of Food in the Last Year: Never true  Transportation Needs: No Transportation Needs (09/06/2023)   PRAPARE - Administrator, Civil Service (Medical): No    Lack of Transportation (Non-Medical): No  Physical Activity: Sufficiently Active (09/06/2023)   Exercise Vital Sign    Days of Exercise per Week: 7 days    Minutes of Exercise per Session: 30 min  Stress: No Stress Concern Present (09/06/2023)  [employer] of Occupational Health - Occupational Stress Questionnaire    Feeling of Stress: Not at all  Social Connections: Moderately Integrated (09/06/2023)   Social Connection and Isolation Panel    Frequency of Communication with Friends and Family: More than three times a week    Frequency of Social Gatherings with Friends and Family: More than three times a week    Attends Religious Services: More than 4 times per year    Active Member of [employer] or Organizations: No    Attends Engineer, structural: Never    Marital Status: Married    Tobacco Counseling Counseling given: Yes    Clinical Intake:  Pre-visit preparation completed: Yes  Pain : No/denies pain Pain Score: 0-No pain     BMI - recorded: 29.23 Nutritional Status: BMI 25 -29 Overweight Nutritional Risks: None Diabetes: Yes CBG done?: No (telehealth visit. unable to obtain cbg) Did pt. bring in CBG monitor from home?: No  Lab Results  Component Value Date   HGBA1C 6.5 (H) 07/15/2023   HGBA1C 7.6 (H) 04/15/2023   HGBA1C 7.1 (H) 12/10/2022     How often do you need to have someone help you when you read instructions, pamphlets, or other written materials from your doctor or pharmacy?: 1 - Never  Interpreter Needed?:  No  Information entered by :: A Daissy Yerian, CMA   Activities of Daily Living     09/06/2023   11:27 AM  In your present state of health, do you have any difficulty performing the following activities:  Hearing? 0  Vision? 0  Difficulty concentrating or making decisions? 0  Walking or climbing stairs? 0  Dressing or bathing? 0  Doing errands, shopping? 0  Preparing Food and eating ? N  Using the Toilet? N  In the past six months, have you accidently leaked urine? N  Do you have problems with loss of bowel control? N  Managing your Medications? N  Managing your Finances? N  Housekeeping or managing your Housekeeping? N    Patient Care Team: Jolinda Norene HERO, DO as PCP - General (Family Medicine) Vicci Mcardle, OD (Optometry) Shona Rush, MD (Dermatology)  I have updated your Care Teams any recent Medical Services you may have received from other providers in the past year.     Assessment:   This is a routine wellness examination for [place].  Hearing/Vision screen Hearing Screening - Comments:: Patient denies any hearing difficulties.   Vision Screening - Comments:: Wears rx glasses - up to date with routine eye exams with  [facility] [place] location   Goals Addressed             This Visit's Progress    Remain active and independent   On track      Depression Screen     09/06/2023   11:31 AM 07/15/2023    8:48 AM 05/14/2023    2:52 PM 04/15/2023    9:19 AM 01/21/2023    9:16 AM 12/12/2022    9:01 AM 06/20/2022    2:56 PM  PHQ 2/9 Scores  PHQ - 2 Score 0 0 0 0 0 0 0  PHQ- 9 Score 0 0 0 0 0 0 0    Fall Risk     09/06/2023   11:27 AM 07/15/2023    8:48 AM 05/14/2023    2:53 PM 01/21/2023    9:16 AM 08/08/2022    2:26 PM  Fall Risk   Falls in the past year?  0 0 0 0 0  Number falls in past yr: 0 0 0 0 0  Injury with Fall? 0 0 0 0 0  Risk for fall due to : No Fall Risks No Fall Risks No Fall Risks No Fall Risks   Follow up Falls evaluation  completed;Education provided;Falls prevention discussed Falls evaluation completed;Education provided Falls evaluation completed Education provided     MEDICARE RISK AT HOME:  Medicare Risk at Home Any stairs in or around the home?: Yes If so, are there any without handrails?: No Home free of loose throw rugs in walkways, pet beds, electrical cords, etc?: Yes Adequate lighting in your home to reduce risk of falls?: Yes Life alert?: No Use of a cane, walker or w/c?: No Grab bars in the bathroom?: Yes Shower chair or bench in shower?: No Elevated toilet seat or a handicapped toilet?: Yes  TIMED UP AND GO:  Was the test performed?  No  Cognitive Function: 6CIT completed        09/06/2023   11:28 AM 06/20/2022    2:57 PM 10/10/2021    8:27 AM 10/07/2020    8:27 AM 10/07/2019    8:29 AM  6CIT Screen  What Year? 0 points 0 points 0 points 0 points 0 points  What month? 0 points 0 points 0 points 0 points 0 points  What time? 0 points 0 points 0 points 0 points 0 points  Count back from 20 0 points 0 points 0 points 0 points 0 points  Months in reverse 0 points 0 points 4 points 0 points 4 points  Repeat phrase 0 points 0 points 0 points 4 points 4 points  Total Score 0 points 0 points 4 points 4 points 8 points    Immunizations Immunization History  Administered Date(s) Administered   Fluad Quad(high Dose 65+) 02/10/2021   Moderna SARS-COV2 Booster Vaccination 05/20/2020   Moderna Sars-Covid-2 Vaccination 04/24/2019, 05/27/2019   Pneumococcal Conjugate-13 01/07/2017   Pneumococcal Polysaccharide-23 04/07/2018   Td 05/30/1999   Zoster Recombinant(Shingrix ) 11/06/2021    Screening Tests Health Maintenance  Topic Date Due   COVID-19 Vaccine (3 - Moderna risk series) 06/17/2020   Zoster Vaccines- Shingrix  (2 of 2) 01/01/2022   DTaP/Tdap/Td (2 - Tdap) 12/12/2023 (Originally 05/29/2009)   MAMMOGRAM  09/19/2023   INFLUENZA VACCINE  09/27/2023   Diabetic kidney evaluation -  Urine ACR  12/10/2023   FOOT EXAM  12/12/2023   HEMOGLOBIN A1C  01/15/2024   OPHTHALMOLOGY EXAM  06/16/2024   Diabetic kidney evaluation - eGFR measurement  07/18/2024   Medicare Annual Wellness (AWV)  09/05/2024   DEXA SCAN  07/18/2025   Colonoscopy  08/20/2026   Pneumococcal Vaccine: 50+ Years  Completed   Hepatitis C Screening  Completed   Hepatitis B Vaccines  Aged Out   HPV VACCINES  Aged Out   Meningococcal B Vaccine  Aged Out    Health Maintenance  Health Maintenance Due  Topic Date Due   COVID-19 Vaccine (3 - Moderna risk series) 06/17/2020   Zoster Vaccines- Shingrix  (2 of 2) 01/01/2022   Health Maintenance Items Addressed: Mammogram scheduled  Additional Screening:  Vision Screening: Recommended annual ophthalmology exams for early detection of glaucoma and other disorders of the eye. Would you like a referral to an eye doctor? No    Dental Screening: Recommended annual dental exams for proper oral hygiene  Community Resource Referral / Chronic Care Management: CRR required this visit?  No   CCM required this visit?  No   Plan:    I have personally reviewed and noted the following in the patient's chart:   Medical and social history Use of alcohol, tobacco or illicit drugs  Current medications and supplements including opioid prescriptions. Patient is not currently taking opioid prescriptions. Functional ability and status Nutritional status Physical activity Advanced directives List of other physicians Hospitalizations, surgeries, and ER visits in previous 12 months Vitals Screenings to include cognitive, depression, and falls Referrals and appointments  In addition, I have reviewed and discussed with patient certain preventive protocols, quality metrics, and best practice recommendations. A written personalized care plan for preventive services as well as general preventive health recommendations were provided to patient.   Chevonne Bostrom,  CMA   09/06/2023   After Visit Summary: (MyChart) Due to this being a telephonic visit, the after visit summary with patients personalized plan was offered to patient via MyChart   Notes: See note at top of progress note

## 2023-09-06 NOTE — Patient Instructions (Signed)
 Nichole Cunningham ,  Thank you for taking time out of your busy schedule to complete your Annual Wellness Visit with me. I enjoyed our conversation and look forward to speaking with you again next year. I, as well as your care team,  appreciate your ongoing commitment to your health goals. Please review the following plan we discussed and let me know if I can assist you in the future.  I enjoyed our conversation and look forward to it again next year. Blessing for the upcoming year!!  -Artin Mceuen  Your Game plan/ To Do List   Referrals Placed:  [facility] [employer] [phone] at [facility] on October 07, 2023 at 10:10 am   Follow up Visits:  Next Office Visit with your Primary Care Provider: January 27, 2024 at 10:10 am for yearly physical Medicare Wellness with Health Advisor (1 year): September 09, 2024 at 10:40 am telephone    Clinician Recommendations:  Aim for 30 minutes of exercise or brisk walking, 6-8 glasses of water, and 5 servings of fruits and vegetables each day.       This is a list of the screening recommended for you and due dates:  Health Maintenance  Topic Date Due   COVID-19 Vaccine (3 - Moderna risk series) 06/17/2020   Zoster (Shingles) Vaccine (2 of 2) 01/01/2022   DTaP/Tdap/Td vaccine (2 - Tdap) 12/12/2023*   Mammogram  09/19/2023   Flu Shot  09/27/2023   Yearly kidney health urinalysis for diabetes  12/10/2023   Complete foot exam   12/12/2023   Hemoglobin A1C  01/15/2024   Eye exam for diabetics  06/16/2024   Yearly kidney function blood test for diabetes  07/18/2024   Medicare Annual Wellness Visit  09/05/2024   DEXA scan (bone density measurement)  07/18/2025   Colon Cancer Screening  08/20/2026   Pneumococcal Vaccine for age over 78  Completed   Hepatitis C Screening  Completed   Hepatitis B Vaccine  Aged Out   HPV Vaccine  Aged Out   Meningitis B Vaccine  Aged Out  *Topic was postponed. The date shown is not the original due date.    Advanced  directives: (Provided) Advance directive discussed with you today. I have provided a copy for you to complete at home and have notarized. Once this is complete, please bring a copy in to our office so we can scan it into your chart.  Advance Care Planning is important because it:  [x]  Makes sure you receive the medical care that is consistent with your values, goals, and preferences  [x]  It provides guidance to your family and loved ones and reduces their decisional burden about whether or not they are making the right decisions based on your wishes.  Follow the link provided in your after visit summary or read over the paperwork we have mailed to you to help you started getting your Advance Directives in place. If you need assistance in completing these, please reach out to us  so that we can help you!  If you choose to send your directives in yourself, the information to do so is below:  Please email a copy of your Advanced Healthcare Directives,such as your Healthcare Power of Attorney, Living Will or DNR status to the following secure email: [email or web address]

## 2023-09-07 ENCOUNTER — Other Ambulatory Visit: Payer: Self-pay | Admitting: Family Medicine

## 2023-09-07 DIAGNOSIS — J302 Other seasonal allergic rhinitis: Secondary | ICD-10-CM

## 2023-09-23 ENCOUNTER — Ambulatory Visit (INDEPENDENT_AMBULATORY_CARE_PROVIDER_SITE_OTHER): Admitting: Family Medicine

## 2023-09-23 ENCOUNTER — Encounter: Payer: Self-pay | Admitting: Family Medicine

## 2023-09-23 VITALS — BP 125/77 | HR 97 | Temp 97.8°F | Ht 63.0 in | Wt 165.0 lb

## 2023-09-23 DIAGNOSIS — M25562 Pain in left knee: Secondary | ICD-10-CM | POA: Diagnosis not present

## 2023-09-23 DIAGNOSIS — R5383 Other fatigue: Secondary | ICD-10-CM | POA: Diagnosis not present

## 2023-09-23 DIAGNOSIS — E119 Type 2 diabetes mellitus without complications: Secondary | ICD-10-CM

## 2023-09-23 DIAGNOSIS — Z7985 Long-term (current) use of injectable non-insulin antidiabetic drugs: Secondary | ICD-10-CM

## 2023-09-23 MED ORDER — EMPAGLIFLOZIN 25 MG PO TABS: 25.0000 mg | ORAL_TABLET | Freq: Every day | ORAL | 3 refills | Status: DC

## 2023-09-23 NOTE — Progress Notes (Signed)
 Subjective: CC: Fatigue PCP: Jolinda Norene HERO, DO NicholeOpwij Nichole Cunningham is a 73 y.o. female presenting to clinic today for:  1.  Fatigue Patient reports that she is been having increased fatigue since starting the Ozempic .  She notes that she is been having some GERD and globus sensation as well.  She reports increased burping and simply just wants to try something else.  Previously treated with metformin .  2.  Left knee pain She reports intermittent left-sided knee pain with certain positions.  She denies any falls or instability.  No preceding injury.  No swelling.   ROS: Per HPI  No Known Allergies Past Medical History:  Diagnosis Date   Complication of anesthesia    Hyperlipidemia    Partial small bowel obstruction (HCC) 07/08/2022   PONV (postoperative nausea and vomiting)     Current Outpatient Medications:    empagliflozin  (JARDIANCE ) 25 MG TABS tablet, Take 1 tablet (25 mg total) by mouth daily before breakfast., Disp: 90 tablet, Rfl: 3   acetaminophen  (TYLENOL ) 500 MG tablet, Take 500 mg by mouth every 6 (six) hours as needed., Disp: , Rfl:    augmented betamethasone dipropionate (DIPROLENE-AF) 0.05 % cream, Apply 1 Application topically 2 (two) times daily as needed (flare up of itching skin on legs)., Disp: , Rfl:    azelastine  (ASTELIN ) 0.1 % nasal spray, Place 1 spray into both nostrils 2 (two) times daily. (Patient not taking: Reported on 09/06/2023), Disp: 30 mL, Rfl: 12   Cholecalciferol 50 MCG (2000 UT) TABS, Take 1 tablet by mouth daily., Disp: , Rfl:    fenofibrate  160 MG tablet, TAKE 1 TABLET (160 MG TOTAL) DAILY BY MOUTH., Disp: 90 tablet, Rfl: 3   glucose blood (ONETOUCH VERIO) test strip, TEST BLOOD GLUCOSE DAILY AS NEEDED E11.9 (ONE TOUCH VERIO REFLECT), Disp: 100 strip, Rfl: 3   Lancet Device MISC, Test BGs once daily as needed E11.9 (one touch verio reflect), Disp: 100 each, Rfl: 12   levocetirizine (XYZAL ) 5 MG tablet, TAKE 1 TABLET (5 MG TOTAL) BY MOUTH  AT BEDTIME AS NEEDED FOR ALLERGIES. FOR ALLERGIES/ DRAINAGE, Disp: 90 tablet, Rfl: 3   Omega-3 Fatty Acids (OMEGA 3 PO), Take 1 tablet by mouth daily. (Patient not taking: Reported on 09/06/2023), Disp: , Rfl:    rosuvastatin  (CRESTOR ) 10 MG tablet, Take 1 tablet (10 mg total) by mouth daily., Disp: 90 tablet, Rfl: 3   tiZANidine  (ZANAFLEX ) 4 MG tablet, Take 1 tablet (4 mg total) by mouth every 8 (eight) hours as needed for muscle spasms., Disp: 30 tablet, Rfl: 0 Social History   Socioeconomic History   Marital status: Married    Spouse name: Charles   Number of children: 2   Years of education: 12   Highest education level: High school graduate  Occupational History   Occupation: retired  Tobacco Use   Smoking status: Never   Smokeless tobacco: Never  Vaping Use   Vaping status: Never Used  Substance and Sexual Activity   Alcohol use: No   Drug use: No   Sexual activity: Yes    Birth control/protection: Post-menopausal  Other Topics Concern   Not on file  Social History Narrative   Not on file   Social Drivers of Health   Financial Resource Strain: Low Risk  (09/06/2023)   Overall Financial Resource Strain (CARDIA)    Difficulty of Paying Living Expenses: Not hard at all  Food Insecurity: No Food Insecurity (09/06/2023)   Hunger Vital Sign  Worried About Programme researcher, broadcasting/film/video in the Last Year: Never true    Ran Out of Food in the Last Year: Never true  Transportation Needs: No Transportation Needs (09/06/2023)   PRAPARE - Administrator, Civil Service (Medical): No    Lack of Transportation (Non-Medical): No  Physical Activity: Sufficiently Active (09/06/2023)   Exercise Vital Sign    Days of Exercise per Week: 7 days    Minutes of Exercise per Session: 30 min  Stress: No Stress Concern Present (09/06/2023)   [employer] of Occupational Health - Occupational Stress Questionnaire    Feeling of Stress: Not at all  Social Connections: Moderately Integrated  (09/06/2023)   Social Connection and Isolation Panel    Frequency of Communication with Friends and Family: More than three times a week    Frequency of Social Gatherings with Friends and Family: More than three times a week    Attends Religious Services: More than 4 times per year    Active Member of [employer] or Organizations: No    Attends Banker Meetings: Never    Marital Status: Married  Catering manager Violence: Not At Risk (09/06/2023)   Humiliation, Afraid, Rape, and Kick questionnaire    Fear of Current or Ex-Partner: No    Emotionally Abused: No    Physically Abused: No    Sexually Abused: No   Family History  Problem Relation Age of Onset   Breast cancer Maternal Aunt    Cancer Mother    Heart disease Father    Heart attack Father        cause of death   Cancer Sister    Colon cancer Sister        dx 2020   Cancer Brother        metastatic w/ uncertain primary dx 2020   Anesthesia problems Neg Hx    Hypotension Neg Hx    Malignant hyperthermia Neg Hx    Pseudochol deficiency Neg Hx    Stomach cancer Neg Hx     Objective: Office vital signs reviewed. BP 125/77   Pulse 97   Temp 97.8 F (36.6 C)   Ht 5' 3 (1.6 m)   Wt 165 lb (74.8 kg)   SpO2 94%   BMI 29.23 kg/m   Physical Examination:  General: Awake, alert, well-appearing, well-nourished female, No acute distress HEENT: No exophthalmos.  No goiter.  No conjunctival pallor MSK: normal gait and station, left knee without any gross joint effusion.  No tenderness palpation to patella, patellar tendon, quads tendon, joint line.  She does not have any palpable abnormalities in the posterior popliteal fossa and no ligamentous laxity appreciated.  Assessment/ Plan: 73 y.o. female   Diabetes mellitus treated with injections of non-insulin medication (HCC) - Plan: empagliflozin  (JARDIANCE ) 25 MG TABS tablet  Fatigue, unspecified type  Posterior left knee pain  Discontinue Ozempic .  Samples of 10  mg of Jardiance  x 2 weeks given and I have sent over 25 mg.  We discussed ways to reduce risk of vaginitis and UTI.  Encourage p.o. hydration.  May start Saturday since this is when she is due for her normal Ozempic .  She will contact me if it is unaffordable  I suspect posterior knee pain is likely a tendinopathy.  She did not have any palpable Baker's cyst but this was a amongst my differential.  Encouraged use of brace, topical NSAID.  She will let me know if symptoms or not improving  and I will send referral to orthopedics for her   Norene CHRISTELLA Fielding, DO [facility] [phone]

## 2023-09-23 NOTE — Patient Instructions (Signed)
 Popliteus Tendinitis Rehab Ask your health care provider which exercises are safe for you. Do exercises exactly as told by your health care provider and adjust them as directed. It is normal to feel mild stretching, pulling, tightness, or discomfort as you do these exercises. Stop right away if you feel sudden pain or your pain gets worse. Do not begin these exercises until told by your health care provider. Stretching and range-of-motion exercises These exercises warm up your muscles and joints and improve the movement and flexibility of your knee. These exercises also help to relieve pain, numbness, and tingling. Gastrocnemius stretch, standing This exercise is also called a calf stretch. It stretches the muscles in the back of the upper calf (gastrocnemius). Stand with your hands against a wall. Extend your left / right leg behind you, and bend your front knee slightly. Your heels should be on the floor. Point the toes on your back foot slightly inward. Keeping your heels on the floor and your back knee straight, shift your weight toward the wall. Do not arch your back. You should feel a gentle stretch in your upper left / right calf. Hold this position for __________ seconds. Repeat __________ times. Complete this exercise __________ times a day. Soleus stretch, standing This exercise is also called a calf stretch. It stretches the muscles in the back of the lower calf (soleus). Stand with your hands against a wall. Extend your left / right leg behind you, and bend your front knee slightly. Your heels should be on the floor. Point the toes on your back foot slightly inward. Keeping your heels on the floor, bend your back knee and shift your weight slightly over your back leg. You should feel a gentle stretch deep in your lower calf. Hold this position for __________ seconds. Repeat __________ times. Complete this exercise __________ times a day. Gastroc and soleus stretch, standing  Stand  on the edge of a step on the balls of your feet, and hold on to the rail or a chair for balance. Slowly lift your healthy foot, allowing your body weight to press your left / right heel down over the edge of the step. You should feel a stretch in your left / right calf (gastrocsoleus). Hold this position for __________ seconds. Return both feet to the step. Repeat this exercise with a slight bend in your left / right knee. Repeat __________ times with your left / right knee straight and __________ times with your left / right knee bent. Complete this exercise __________ times a day. Hamstring stretch, standing  Stand with your left / right leg fully extended and your heel resting on a chair. Arch your lower back slightly. Lean forward at the waist, leading with your chest, until you feel a gentle stretch in the back of your left / right knee or thigh (hamstring). You should not need to lean far to feel the stretch. Hold this position for __________ seconds. Repeat __________ times. Complete this exercise __________ times a day. Hamstrings stretch, supine  Lie on your back (supine position). Loop a belt or towel over the ball of your left / right foot. The ball of your foot is on the walking surface, right under your toes. Straighten your left / right knee and slowly pull on the belt to raise your leg until you feel a gentle stretch behind your knee (hamstring). Do not let your knee bend while you do this. Keep your other leg flat on the floor. Hold this position  for __________ seconds. Repeat __________ times. Complete this exercise __________ times a day. Hamstrings stretch, doorway  Lie on your back in front of a doorway with your left / right leg resting against the wall and your other leg flat on the floor in the doorway. There should be a slight bend in your left / right knee. Straighten your left / right knee. You should feel a stretch behind your knee or thigh (hamstring). If you do  not, scoot your buttocks closer to the door. Hold this position for __________ seconds. Repeat __________ times. Complete this exercise __________ times a day. Quadriceps stretch, prone  Lie on your abdomen on a firm surface, such as a bed or floor (prone position). Bend your left / right knee and hold your ankle. If you cannot reach your ankle or pant leg, loop a belt around your foot and grab the belt instead. Gently pull your heel toward your buttocks. Your knee should not slide out to the side. You should feel a stretch in the front of your thigh and knee (quadriceps). Hold this position for __________ seconds. Repeat __________ times. Complete this exercise __________ times a day. Strengthening exercises These exercises build strength and endurance in your knee. Endurance is the ability to use your muscles for a long time, even after they get tired. Hamstring, isometric This is an exercise in which you strengthen the muscles in the back of your thigh (hamstring) without moving your knee joint (isometric). Lie on your back on a firm surface. Bend your left / right knee about __________ degrees. Dig your left / right heel into the surface as if you are trying to pull your heel toward your buttocks. Tighten the muscles in the back of your thighs to dig as hard as you can without increasing any pain. Hold this position for __________ seconds. Release the tension gradually. Allow your muscle to completely relax for __________ seconds. Repeat __________ times. Complete this exercise __________ times a day. Hamstring curls If told by your health care provider, do this exercise while wearing ankle weights. Begin with __________ lb / kg weights. Then increase the weight by 1 lb (0.5 kg) increments. Do not wear ankle weights that are heavier than __________ lb / kg. Lie on your abdomen with your legs straight. Tighten the muscles in the back of your thigh (hamstring) to bend your left / right  knee to a 90-degree angle (right angle). Keep your hips flat against the surface that you are lying on. Hold this position for __________ seconds. Slowly lower your leg to the starting position. Repeat __________ times. Complete this exercise __________ times a day. This information is not intended to replace advice given to you by your health care provider. Make sure you discuss any questions you have with your health care provider. Document Revised: 01/03/2021 Document Reviewed: 01/03/2021 Elsevier Patient Education  2024 [employer].

## 2023-10-07 ENCOUNTER — Ambulatory Visit
Admission: RE | Admit: 2023-10-07 | Discharge: 2023-10-07 | Disposition: A | Discharge status: Home / Self Care | Source: Ambulatory Visit | Attending: Family Medicine | Admitting: Family Medicine

## 2023-10-07 DIAGNOSIS — Z1231 Encounter for screening mammogram for malignant neoplasm of breast: Secondary | ICD-10-CM

## 2023-10-21 DIAGNOSIS — L218 Other seborrheic dermatitis: Secondary | ICD-10-CM | POA: Diagnosis not present

## 2023-10-21 DIAGNOSIS — L720 Epidermal cyst: Secondary | ICD-10-CM | POA: Diagnosis not present

## 2023-10-21 DIAGNOSIS — L820 Inflamed seborrheic keratosis: Secondary | ICD-10-CM | POA: Diagnosis not present

## 2023-10-28 DEATH — deceased

## 2023-12-25 ENCOUNTER — Ambulatory Visit: Admitting: Nurse Practitioner

## 2023-12-25 ENCOUNTER — Encounter: Payer: Self-pay | Admitting: Nurse Practitioner

## 2023-12-25 VITALS — BP 130/84 | HR 99 | Temp 98.1°F | Ht 63.0 in | Wt 160.2 lb

## 2023-12-25 DIAGNOSIS — R0981 Nasal congestion: Secondary | ICD-10-CM | POA: Diagnosis not present

## 2023-12-25 DIAGNOSIS — J069 Acute upper respiratory infection, unspecified: Secondary | ICD-10-CM | POA: Diagnosis not present

## 2023-12-25 DIAGNOSIS — R051 Acute cough: Secondary | ICD-10-CM | POA: Insufficient documentation

## 2023-12-25 DIAGNOSIS — J029 Acute pharyngitis, unspecified: Secondary | ICD-10-CM | POA: Insufficient documentation

## 2023-12-25 LAB — VERITOR FLU A/B WAIVED
Influenza A: NEGATIVE
Influenza B: NEGATIVE

## 2023-12-25 LAB — CULTURE, GROUP A STREP

## 2023-12-25 LAB — RAPID STREP SCREEN (MED CTR MEBANE ONLY): Strep Gp A Ag, IA W/Reflex: NEGATIVE

## 2023-12-25 MED ORDER — AMOXICILLIN 875 MG PO TABS: 875.0000 mg | ORAL_TABLET | Freq: Two times a day (BID) | ORAL | 0 refills | Status: DC

## 2023-12-25 MED ORDER — GUAIFENESIN 400 MG PO TABS: 400.0000 mg | ORAL_TABLET | Freq: Four times a day (QID) | ORAL | 0 refills | Status: DC | PRN

## 2023-12-25 MED ORDER — AZELASTINE HCL 0.1 % NA SOLN: 1.0000 | Freq: Two times a day (BID) | NASAL | 0 refills | Status: DC

## 2023-12-25 NOTE — Progress Notes (Addendum)
 Subjective:  Patient ID: Nichole Cunningham, female    DOB: 21-Dec-1950, 73 y.o.   MRN: [id number]  Patient Care Team: Jolinda Norene HERO, DO as PCP - General (Family Medicine) Vicci Mcardle, OD (Optometry) Shona Rush, MD (Dermatology)   Chief Complaint:  Sore Throat (Symptoms started yesterday/ ), Cough, and Nasal Congestion   HPI: Nichole Cunningham is a 73 y.o. female presenting on 12/25/2023 for Sore Throat (Symptoms started yesterday/ ), Cough, and Nasal Congestion   Discussed the use of AI scribe software for clinical note transcription with the patient, who gave verbal consent to proceed.  History of Present Illness Nichole Cunningham is a 73 year old female who presents with upper respiratory symptoms including sore throat, sneezing, and coughing.  Her symptoms began yesterday with a scratchy throat, sneezing, and coughing. She also experiences a tight feeling in her chest and a sensation of her throat swelling and closing up, which affected her ability to sleep last night.  She has taken over-the-counter cough medicine, which provided some relief.  No fever, nausea, vomiting, or body aches. She reports slight congestion and a tight feeling in her chest.  She began experiencing symptoms of sore throat, cough, and congestion last night. She describes a sensation of her throat 'closing' during the night, which has been distressing.      Relevant past medical, surgical, family, and social history reviewed and updated as indicated.  Allergies and medications reviewed and updated. Data reviewed: Chart in Epic.   Past Medical History:  Diagnosis Date   Complication of anesthesia    Hyperlipidemia    Partial small bowel obstruction (HCC) 07/08/2022   PONV (postoperative nausea and vomiting)     Past Surgical History:  Procedure Laterality Date   CHOLECYSTECTOMY  04/13/2011   Procedure: LAPAROSCOPIC CHOLECYSTECTOMY;  Surgeon: Mcardle JAYSON Pulling, MD;  Location: [facility];  Service:  General;  Laterality: N/A;   COLONOSCOPY N/A 08/20/2023   Procedure: COLONOSCOPY;  Surgeon: Cindie Carlin POUR, DO;  Location: [facility];  Service: Endoscopy;  Laterality: N/A;  10:30am, asa 2   HEMORRHOID SURGERY  2003   [place]   SKIN LESION EXCISION  02/2018   mole removed from forehead     Social History   Socioeconomic History   Marital status: Married    Spouse name: Carlin   Number of children: 2   Years of education: 12   Highest education level: High school graduate  Occupational History   Occupation: retired  Tobacco Use   Smoking status: Never   Smokeless tobacco: Never  Vaping Use   Vaping status: Never Used  Substance and Sexual Activity   Alcohol use: No   Drug use: No   Sexual activity: Yes    Birth control/protection: Post-menopausal  Other Topics Concern   Not on file  Social History Narrative   Not on file   Social Drivers of Health   Financial Resource Strain: Low Risk  (09/06/2023)   Overall Financial Resource Strain (CARDIA)    Difficulty of Paying Living Expenses: Not hard at all  Food Insecurity: No Food Insecurity (09/06/2023)   Hunger Vital Sign    Worried About Running Out of Food in the Last Year: Never true    Ran Out of Food in the Last Year: Never true  Transportation Needs: No Transportation Needs (09/06/2023)   PRAPARE - Administrator, Civil Service (Medical): No    Lack of Transportation (Non-Medical): No  Physical Activity: Sufficiently Active (09/06/2023)   Exercise Vital Sign    Days of Exercise per Week: 7 days    Minutes of Exercise per Session: 30 min  Stress: No Stress Concern Present (09/06/2023)   [employer] of Occupational Health - Occupational Stress Questionnaire    Feeling of Stress: Not at all  Social Connections: Moderately Integrated (09/06/2023)   Social Connection and Isolation Panel    Frequency of Communication with Friends and Family: More than three times a week    Frequency of Social  Gatherings with Friends and Family: More than three times a week    Attends Religious Services: More than 4 times per year    Active Member of [employer] or Organizations: No    Attends Banker Meetings: Never    Marital Status: Married  Catering Manager Violence: Not At Risk (09/06/2023)   Humiliation, Afraid, Rape, and Kick questionnaire    Fear of Current or Ex-Partner: No    Emotionally Abused: No    Physically Abused: No    Sexually Abused: No    Outpatient Encounter Medications as of 12/25/2023  Medication Sig   acetaminophen  (TYLENOL ) 500 MG tablet Take 500 mg by mouth every 6 (six) hours as needed.   augmented betamethasone dipropionate (DIPROLENE-AF) 0.05 % cream Apply 1 Application topically 2 (two) times daily as needed (flare up of itching skin on legs).   Cholecalciferol 50 MCG (2000 UT) TABS Take 1 tablet by mouth daily.   empagliflozin  (JARDIANCE ) 25 MG TABS tablet Take 1 tablet (25 mg total) by mouth daily before breakfast.   fenofibrate  160 MG tablet TAKE 1 TABLET (160 MG TOTAL) DAILY BY MOUTH.   glucose blood (ONETOUCH VERIO) test strip TEST BLOOD GLUCOSE DAILY AS NEEDED E11.9 (ONE TOUCH VERIO REFLECT)   Lancet Device MISC Test BGs once daily as needed E11.9 (one touch verio reflect)   levocetirizine (XYZAL ) 5 MG tablet TAKE 1 TABLET (5 MG TOTAL) BY MOUTH AT BEDTIME AS NEEDED FOR ALLERGIES. FOR ALLERGIES/ DRAINAGE   rosuvastatin  (CRESTOR ) 10 MG tablet Take 1 tablet (10 mg total) by mouth daily.   tiZANidine  (ZANAFLEX ) 4 MG tablet Take 1 tablet (4 mg total) by mouth every 8 (eight) hours as needed for muscle spasms.   azelastine  (ASTELIN ) 0.1 % nasal spray Place 1 spray into both nostrils 2 (two) times daily. (Patient not taking: Reported on 12/25/2023)   Omega-3 Fatty Acids (OMEGA 3 PO) Take 1 tablet by mouth daily. (Patient not taking: Reported on 12/25/2023)   No facility-administered encounter medications on file as of 12/25/2023.    No Known  Allergies  Review of Systems  Constitutional:  Negative for chills and fever.  HENT:  Positive for congestion and sore throat. Negative for ear pain.   Respiratory:  Positive for cough. Negative for shortness of breath.   Cardiovascular:  Negative for chest pain and leg swelling.  Gastrointestinal:  Negative for constipation, diarrhea, nausea and vomiting.  Musculoskeletal:  Negative for myalgias.  Skin:  Negative for rash.  Neurological:  Negative for dizziness and headaches.         Objective:  BP 130/84   Pulse 99   Temp 98.1 F (36.7 C) (Temporal)   Ht 5' 3 (1.6 m)   Wt 160 lb 3.2 oz (72.7 kg)   SpO2 92%   BMI 28.38 kg/m    Wt Readings from Last 3 Encounters:  12/25/23 160 lb 3.2 oz (72.7 kg)  09/23/23 165 lb (74.8 kg)  09/06/23 165 lb (74.8 kg)    Physical Exam Vitals and nursing note reviewed.  Constitutional:      General: She is not in acute distress. HENT:     Head: Normocephalic and atraumatic.     Right Ear: Tympanic membrane, ear canal and external ear normal. No drainage or swelling.     Left Ear: Tympanic membrane, ear canal and external ear normal. No drainage or swelling.     Nose: Congestion present.     Mouth/Throat:     Mouth: Mucous membranes are moist.     Pharynx: Posterior oropharyngeal erythema and postnasal drip present.  Eyes:     Extraocular Movements: Extraocular movements intact.     Conjunctiva/sclera: Conjunctivae normal.     Pupils: Pupils are equal, round, and reactive to light.  Cardiovascular:     Heart sounds: Normal heart sounds.  Pulmonary:     Effort: Pulmonary effort is normal.     Breath sounds: Normal breath sounds.  Musculoskeletal:        General: Normal range of motion.     Right lower leg: No edema.     Left lower leg: No edema.  Skin:    General: Skin is warm and dry.     Findings: No rash.  Neurological:     Mental Status: She is alert and oriented to person, place, and time.  Psychiatric:        Mood and  Affect: Mood normal.        Behavior: Behavior normal.        Thought Content: Thought content normal.        Judgment: Judgment normal.    Physical Exam    POC flu A 7 B negative POC strep negative COVID will be sent out result pending  Results for orders placed or performed during the hospital encounter of 08/20/23  Glucose, capillary   Collection Time: 08/20/23  9:04 AM  Result Value Ref Range   Glucose-Capillary 110 (H) 70 - 99 mg/dL  Surgical pathology   Collection Time: 08/20/23 10:27 AM  Result Value Ref Range   SURGICAL PATHOLOGY      SURGICAL PATHOLOGY CASE: [id number] PATIENT: Loryn Chernick Surgical Pathology Report     Clinical History: screening, family hx colon cancer (tb)     FINAL MICROSCOPIC DIAGNOSIS:  A. COLON ASCENDING POLYPECTOMY: - Tubular adenoma. - No high grade dysplasia or malignancy.  B. COLON TRANSVERSE POLYPECTOMY: - Tubular adenoma. - No high grade dysplasia or malignancy.  GROSS DESCRIPTION:  A. Received in formalin labeled with the patients name and Ascending colon polyps is a 1.5 x 1.5 x 0.2 cm aggregate of tan soft tissue fragments, submitted in toto in a single cassette.  B. Received in formalin labeled with the patients name and Transverse colon polyp is a 1.7 x 1.5 x 0.2 cm aggregate of tan soft tissue fragments, submitted in toto in a single cassette.  (LEF 08/20/2023)   Final Diagnosis performed by Ilsa Pottier, MD.   Electronically signed 08/21/2023 Technical component performed at [facility], [address], [place], [place] [address].  Professional component performed at [facility], [address], [place], [place] [address].  Immunohistochemistry Technical component (if applicable) was performed at [facility]. [address], [place], [place] [address].   IMMUNOHISTOCHEMISTRY DISCLAIMER (if applicable): Some of these  immunohistochemical stains may have been developed and the performance characteristics determine by [facility]. Some may not  have been cleared or approved by the U.S. Food and Drug Administration. The FDA has determined that such clearance or approval is not necessary. This test is used for clinical purposes. It should not be regarded as investigational or for research. This laboratory is certified under the Clinical Laboratory Improvement Amendments of 1988 (CLIA-88) as qualified to perform high complexity clinical laboratory testing.  The controls stained appropriately.   IHC stains are performed on forma lin fixed, paraffin embedded tissue using a 3,3diaminobenzidine (DAB) chromogen and Leica Bond Autostainer System. The staining intensity of the nucleus is score manually and is reported as the percentage of tumor cell nuclei demonstrating specific nuclear staining. The specimens are fixed in 10% Neutral Formalin for at least 6 hours and up to 72hrs. These tests are validated on decalcified tissue. Results should be interpreted with caution given the possibility of false negative results on decalcified specimens. Antibody Clones are as follows ER-clone 6F, PR-clone 16, Ki67- clone MM1. Some of these immunohistochemical stains may have been developed and the performance characteristics determined by [facility].        Pertinent labs & imaging results that were available during my care of the patient were reviewed by me and considered in my medical decision making.  Assessment & Plan:  Nichole Cunningham was seen today for sore throat, cough and nasal congestion.  Diagnoses and all orders for this visit:  Sore throat -     Veritor Flu A/B Waived -     Rapid Strep Screen ([facility] ONLY) -     Novel Coronavirus, NAA ([facility])  Acute cough -     Veritor Flu A/B Waived -     Rapid Strep Screen ([facility] ONLY) -     Novel Coronavirus, NAA ([facility])  Nasal  congestion -     Veritor Flu A/B Waived -     Rapid Strep Screen ([facility] ONLY) -     Novel Coronavirus, NAA ([facility])     Assessment and Plan Nichole Cunningham is a 73 year old Caucasian female seen today for pharyngitis, no acute distress Assessment & Plan Acute upper respiratory infection Awaiting swab results. - Ordered swabs for flu, COVID-19, and Strep.  Acute Upper Respiratory Infection Differential includes viral infections such as influenza and COVID-19. COVID-19 results pending. - Order COVID-19, influenza, and RSV tests. POC strep negative for treatment based on clinical presentation Amoxicillin  875 twice daily for 10 days Guaifenesin  400 mg 3 times daily as needed as needed for cough Astelin  twice daily for congestion Tylenol /ibuprofen if develop a fever Increase hydration    Continue all other maintenance medications.  Follow up plan: No follow-ups on file.   Continue healthy lifestyle choices, including diet (rich in fruits, vegetables, and lean proteins, and low in salt and simple carbohydrates) and exercise (at least 30 minutes of moderate physical activity daily).  Educational handout given for    Clinical References  Chronic Cough Coughing is a reflex that clears your throat and airways (respiratory system). It helps heal and protect your lungs. It is normal to cough from time to time. A cough that happens with other symptoms or that lasts a long time may be a sign of a condition that needs treatment. A long-term (chronic) cough may last 8 or more weeks. There are two types of chronic cough: A symptomatic chronic cough. This is caused by a disease that can be found and treated. A refractory chronic cough. This is a cough that does not go away  with testing and treatment. A chronic cough may be caused by: Long-term lung diseases. These include chronic obstructive pulmonary disease (COPD), asthma, and pulmonary fibrosis. Upper airway problems. These include  allergies, sinusitis, and gastric reflux. Some medicines. Smoking. Follow these instructions at home: Medicines Take over-the-counter and prescription medicines only as told by your health care provider. Ask your provider about getting a flu (influenza) or pneumonia vaccine. Managing a sore or dry throat If your throat is sore or dry, gargle with a mixture of salt and water 3-4 times a day or as needed. To make salt water, completely dissolve -1 tsp (3-6 g) of salt in 1 cup (237 mL) of warm water. Soothe your throat with a cough drop or honey. A dry throat may make your cough worse. Use a cool mist vaporizer at home to add moisture to the air. Lifestyle Avoid cigarette smoke. Do not use any products that contain nicotine or tobacco. These products include cigarettes, chewing tobacco, and vaping devices, such as e-cigarettes. If you need help quitting, ask your provider. Avoid things that may irritate your throat or trigger your allergies. General instructions  Drink enough fluid to keep your pee (urine) pale yellow. Always cover your mouth when you cough. Stay away from people who are sick. Getting a cold or the flu can make your cough worse. Wash your hands often with soap and water for at least 20 seconds. If soap and water are not available, use hand sanitizer. Contact a health care provider if: Your cough gets worse. You have a fever or chills. You are short of breath. Get help right away if: You have trouble breathing. You have chest pain. These symptoms may be an emergency. Get help right away. Call 911. Do not wait to see if the symptoms will go away. Do not drive yourself to the hospital. This information is not intended to replace advice given to you by your health care provider. Make sure you discuss any questions you have with your health care provider. Document Revised: 03/08/2022 Document Reviewed: 10/26/2021 Elsevier Patient Education  2024 Elsevier Inc. Sore  Throat When you have a sore throat, your throat may feel: Tender. Burning. Irritated. Scratchy. Painful when you swallow. Painful when you talk. Many things can cause a sore throat, such as: An infection. Allergies. Dry air. Smoke or pollution. Radiation treatment for cancer. Gastroesophageal reflux disease (GERD). A tumor. A sore throat can be the first sign of another sickness. It can happen with other problems, like: Coughing. Sneezing. Fever. Swelling of the glands in the neck. Most sore throats go away without treatment. Follow these instructions at home:     Medicines Take over-the-counter and prescription medicines only as told by your doctor. Children often get sore throats. Do not give your child aspirin. Use throat sprays to soothe your throat as told by your health care provider. Managing pain To help with pain: Sip warm liquids, such as broth, herbal tea, or warm water. Eat or drink cold or frozen liquids, such as frozen ice pops. Rinse your mouth (gargle) with a salt water mixture 3-4 times a day or as needed. To make salt water, dissolve -1 tsp (3-6 g) of salt in 1 cup (237 mL) of warm water. Do not swallow this mixture. Suck on hard candy or throat lozenges. Put a cool-mist humidifier in your bedroom at night. Sit in the bathroom with the door closed for 5-10 minutes while you run hot water in the shower. General instructions Do not  smoke or use any products that contain nicotine or tobacco. If you need help quitting, ask your doctor. Get plenty of rest. Drink enough fluid to keep your pee (urine) pale yellow. Wash your hands often for at least 20 seconds with soap and water. If soap and water are not available, use hand sanitizer. Contact a doctor if: You have a fever for more than 2-3 days. You keep having symptoms for more than 2-3 days. Your throat does not get better in 7 days. You have a fever and your symptoms suddenly get worse. Your child who  is 3 months to 56 years old has a temperature of 102.64F (39C) or higher. Get help right away if: You have trouble breathing. You cannot swallow fluids, soft foods, or your spit. You have swelling in your throat or neck that gets worse. You feel like you may vomit (nauseous) and this feeling lasts a long time. You cannot stop vomiting. These symptoms may be an emergency. Get help right away. Call your local emergency services (911 in the [place] Do not wait to see if the symptoms will go away. Do not drive yourself to the hospital. Summary A sore throat is a painful, burning, irritated, or scratchy throat. Many things can cause a sore throat. Take over-the-counter medicines only as told by your doctor. Get plenty of rest. Drink enough fluid to keep your pee (urine) pale yellow. Contact a doctor if your symptoms get worse or your sore throat does not get better within 7 days. This information is not intended to replace advice given to you by your health care provider. Make sure you discuss any questions you have with your health care provider. Document Revised: 05/11/2020 Document Reviewed: 05/11/2020 Elsevier Patient Education  2024 Elsevier Inc. Upper Respiratory Infection, Adult An upper respiratory infection (URI) affects the nose, throat, and upper airways that lead to the lungs. The most common type of URI is often called the common cold. URIs usually get better on their own, without medical treatment. What are the causes? A URI is caused by a germ (virus). You may catch these germs by: Breathing in droplets from an infected person's cough or sneeze. Touching something that has the germ on it (is contaminated) and then touching your mouth, nose, or eyes. What increases the risk? You are more likely to get a URI if: You are very young or very old. You have close contact with others, such as at work, school, or a health care facility. You smoke. You have long-term (chronic) heart or  lung disease. You have a weakened disease-fighting system (immune system). You have nasal allergies or asthma. You have a lot of stress. You have poor nutrition. What are the signs or symptoms? Runny or stuffy (congested) nose. Cough. Sneezing. Sore throat. Headache. Feeling tired (fatigue). Fever. Not wanting to eat as much as usual. Pain in your forehead, behind your eyes, and over your cheekbones (sinus pain). Muscle aches. Redness or irritation of the eyes. Pressure in the ears or face. How is this treated? URIs usually get better on their own within 7-10 days. Medicines cannot cure URIs, but your doctor may recommend certain medicines to help relieve symptoms, such as: Over-the-counter cold medicines. Medicines to reduce coughing (cough suppressants). Coughing is a type of defense against infection that helps to clear the nose, throat, windpipe, and lungs (respiratory system). Take these medicines only as told by your doctor. Medicines to lower your fever. Follow these instructions at home: Activity Rest as needed.  If you have a fever, stay home from work or school until your fever is gone, or until your doctor says you may return to work or school. You should stay home until you cannot spread the infection anymore (you are not contagious). Your doctor may have you wear a face mask so you have less risk of spreading the infection. Relieving symptoms Rinse your mouth often with salt water. To make salt water, dissolve -1 tsp (3-6 g) of salt in 1 cup (237 mL) of warm water. Use a cool-mist humidifier to add moisture to the air. This can help you breathe more easily. Eating and drinking  Drink enough fluid to keep your pee (urine) pale yellow. Eat soups and other clear broths. General instructions  Take over-the-counter and prescription medicines only as told by your doctor. Do not smoke or use any products that contain nicotine or tobacco. If you need help quitting, ask  your doctor. Avoid being where people are smoking (avoid secondhand smoke). Stay up to date on all your shots (immunizations), and get the flu shot every year. Keep all follow-up visits. How to prevent the spread of infection to others  Wash your hands with soap and water for at least 20 seconds. If you cannot use soap and water, use hand sanitizer. Avoid touching your mouth, face, eyes, or nose. Cough or sneeze into a tissue or your sleeve or elbow. Do not cough or sneeze into your hand or into the air. Contact a doctor if: You are getting worse, not better. You have any of these: A fever or chills. Brown or red mucus in your nose. Yellow or brown fluid (discharge)coming from your nose. Pain in your face, especially when you bend forward. Swollen neck glands. Pain when you swallow. White areas in the back of your throat. Get help right away if: You have shortness of breath that gets worse. You have very bad or constant: Headache. Ear pain. Pain in your forehead, behind your eyes, and over your cheekbones (sinus pain). Chest pain. You have long-lasting (chronic) lung disease along with any of these: Making high-pitched whistling sounds when you breathe, most often when you breathe out (wheezing). Long-lasting cough (more than 14 days). Coughing up blood. A change in your usual mucus. You have a stiff neck. You have changes in your: Vision. Hearing. Thinking. Mood. These symptoms may be an emergency. Get help right away. Call 911. Do not wait to see if the symptoms will go away. Do not drive yourself to the hospital. Summary An upper respiratory infection (URI) is caused by a germ (virus). The most common type of URI is often called the common cold. URIs usually get better within 7-10 days. Take over-the-counter and prescription medicines only as told by your doctor. This information is not intended to replace advice given to you by your health care provider. Make sure you  discuss any questions you have with your health care provider. Document Revised: 09/14/2020 Document Reviewed: 09/14/2020 Elsevier Patient Education  2024 Elsevier Inc.  The above assessment and management plan was discussed with the patient. The patient verbalized understanding of and has agreed to the management plan. Patient is aware to call the clinic if they develop any new symptoms or if symptoms persist or worsen. Patient is aware when to return to the clinic for a follow-up visit. Patient educated on when it is appropriate to go to the emergency department.    Masako Overall [place], [place] [facility] [address]  [address] [place], [place] [address] [phone]

## 2023-12-26 ENCOUNTER — Ambulatory Visit: Payer: Self-pay | Admitting: Nurse Practitioner

## 2023-12-26 LAB — NOVEL CORONAVIRUS, NAA: SARS-CoV-2, NAA: NOT DETECTED

## 2024-01-27 ENCOUNTER — Encounter: Payer: Self-pay | Admitting: Family Medicine

## 2024-01-27 ENCOUNTER — Ambulatory Visit: Payer: Self-pay | Admitting: Family Medicine

## 2024-01-27 VITALS — BP 135/84 | HR 75 | Temp 98.2°F | Ht 62.0 in | Wt 161.2 lb

## 2024-01-27 DIAGNOSIS — Z6829 Body mass index (BMI) 29.0-29.9, adult: Secondary | ICD-10-CM | POA: Diagnosis not present

## 2024-01-27 DIAGNOSIS — E119 Type 2 diabetes mellitus without complications: Secondary | ICD-10-CM | POA: Diagnosis not present

## 2024-01-27 DIAGNOSIS — H9193 Unspecified hearing loss, bilateral: Secondary | ICD-10-CM

## 2024-01-27 DIAGNOSIS — Z0001 Encounter for general adult medical examination with abnormal findings: Secondary | ICD-10-CM

## 2024-01-27 DIAGNOSIS — E1169 Type 2 diabetes mellitus with other specified complication: Secondary | ICD-10-CM

## 2024-01-27 DIAGNOSIS — E785 Hyperlipidemia, unspecified: Secondary | ICD-10-CM

## 2024-01-27 DIAGNOSIS — Z0000 Encounter for general adult medical examination without abnormal findings: Secondary | ICD-10-CM

## 2024-01-27 DIAGNOSIS — Z7985 Long-term (current) use of injectable non-insulin antidiabetic drugs: Secondary | ICD-10-CM

## 2024-01-27 LAB — LIPID PANEL

## 2024-01-27 LAB — BAYER DCA HB A1C WAIVED: HB A1C (BAYER DCA - WAIVED): 6.6 % — ABNORMAL HIGH (ref 4.8–5.6)

## 2024-01-27 MED ORDER — OZEMPIC (0.25 OR 0.5 MG/DOSE) 2 MG/3ML ~~LOC~~ SOPN: PEN_INJECTOR | SUBCUTANEOUS | 3 refills | Status: AC

## 2024-01-27 MED ORDER — ONETOUCH VERIO VI STRP: ORAL_STRIP | 3 refills | Status: AC

## 2024-01-27 MED ORDER — EMPAGLIFLOZIN 25 MG PO TABS: 25.0000 mg | ORAL_TABLET | Freq: Every day | ORAL | 3 refills | Status: AC

## 2024-01-27 MED ORDER — ROSUVASTATIN CALCIUM 10 MG PO TABS: 10.0000 mg | ORAL_TABLET | Freq: Every day | ORAL | 3 refills | Status: AC

## 2024-01-27 MED ORDER — FENOFIBRATE 160 MG PO TABS: ORAL_TABLET | ORAL | 3 refills | Status: AC

## 2024-01-27 NOTE — Progress Notes (Signed)
 Nichole Cunningham is a 73 y.o. female presents to office today for annual physical exam examination.     Type 2 Diabetes with hyperlipidemia:  Compliance with her Jardiance , Crestor  and fenofibrate .  She denies any vaginal symptoms.  No dysuria or hematuria.  No low blood sugars reported.  No chest pain, shortness of breath or visual disturbance.  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  She is to get regular hearing testing done there but has not since she retired.  She will be going on a new insurance, January 1 and she would like to switch back from Jardiance  to the Ozempic .  Last eye exam: UTD Last foot exam: needs Last A1c:  Lab Results  Component Value Date   HGBA1C 6.5 (H) 07/15/2023   Nephropathy screen indicated?: needs Last flu, zoster and/or pneumovax:  Immunization History  Administered Date(s) Administered   Fluad Quad(high Dose 65+) 02/10/2021   Moderna SARS-COV2 Booster Vaccination 05/20/2020   Moderna Sars-Covid-2 Vaccination 04/24/2019, 05/27/2019   Pneumococcal Conjugate-13 01/07/2017   Pneumococcal Polysaccharide-23 04/07/2018   Td 05/30/1999   Zoster Recombinant(Shingrix ) 11/06/2021   Occupation: Retired, Substance use: None Health Maintenance Due  Topic Date Due   Diabetic kidney evaluation - Urine ACR  12/10/2023   FOOT EXAM  12/12/2023   HEMOGLOBIN A1C  01/15/2024    Immunization History  Administered Date(s) Administered   Fluad Quad(high Dose 65+) 02/10/2021   Moderna SARS-COV2 Booster Vaccination 05/20/2020   Moderna Sars-Covid-2 Vaccination 04/24/2019, 05/27/2019   Pneumococcal Conjugate-13 01/07/2017   Pneumococcal Polysaccharide-23 04/07/2018   Td 05/30/1999   Zoster Recombinant(Shingrix ) 11/06/2021   Past Medical History:  Diagnosis Date   Complication of anesthesia    Hyperlipidemia    Partial small bowel obstruction (HCC) 07/08/2022   PONV (postoperative nausea and vomiting)     Social History   Socioeconomic History   Marital status: Married    Spouse name: Carlin   Number of children: 2   Years of education: 12   Highest education level: High school graduate  Occupational History   Occupation: retired  Tobacco Use   Smoking status: Never   Smokeless tobacco: Never  Vaping Use   Vaping status: Never Used  Substance and Sexual Activity   Alcohol use: No   Drug use: No   Sexual activity: Yes    Birth control/protection: Post-menopausal  Other Topics Concern   Not on file  Social History Narrative   Not on file   Social Drivers of Health   Financial Resource Strain: Low Risk  (09/06/2023)   Overall Financial Resource Strain (CARDIA)    Difficulty of Paying Living Expenses: Not hard at all  Food Insecurity: No Food Insecurity (09/06/2023)   Hunger Vital Sign    Worried About Radiation Protection Practitioner of Food in the Last Year: Never true    Ran Out of Food in the Last Year: Never true  Transportation Needs: No Transportation Needs (09/06/2023)   PRAPARE - Administrator, Civil Service (Medical): No    Lack of Transportation (Non-Medical): No  Physical Activity: Sufficiently Active (09/06/2023)   Exercise Vital Sign    Days of Exercise per Week: 7 days    Minutes of Exercise per Session: 30 min  Stress: No Stress Concern Present (09/06/2023)   [employer] of Occupational Health - Occupational Stress Questionnaire    Feeling of Stress: Not at all  Social Connections: Moderately Integrated (  09/06/2023)   Social Connection and Isolation Panel    Frequency of Communication with Friends and Family: More than three times a week    Frequency of Social Gatherings with Friends and Family: More than three times a week    Attends Religious Services: More than 4 times per year    Active Member of [employer] or Organizations: No    Attends Banker Meetings: Never    Marital Status: Married  Catering Manager Violence: Not At Risk (09/06/2023)    Humiliation, Afraid, Rape, and Kick questionnaire    Fear of Current or Ex-Partner: No    Emotionally Abused: No    Physically Abused: No    Sexually Abused: No   Past Surgical History:  Procedure Laterality Date   CHOLECYSTECTOMY  04/13/2011   Procedure: LAPAROSCOPIC CHOLECYSTECTOMY;  Surgeon: Thresa JAYSON Pulling, MD;  Location: [facility];  Service: General;  Laterality: N/A;   COLONOSCOPY N/A 08/20/2023   Procedure: COLONOSCOPY;  Surgeon: Cindie Carlin POUR, DO;  Location: [facility];  Service: Endoscopy;  Laterality: N/A;  10:30am, asa 2   HEMORRHOID SURGERY  2003   [place]   SKIN LESION EXCISION  02/2018   mole removed from forehead    Family History  Problem Relation Age of Onset   Breast cancer Maternal Aunt    Cancer Mother    Heart disease Father    Heart attack Father        cause of death   Cancer Sister    Colon cancer Sister        dx 2020   Cancer Brother        metastatic w/ uncertain primary dx 2020   Anesthesia problems Neg Hx    Hypotension Neg Hx    Malignant hyperthermia Neg Hx    Pseudochol deficiency Neg Hx    Stomach cancer Neg Hx     Current Outpatient Medications:    acetaminophen  (TYLENOL ) 500 MG tablet, Take 500 mg by mouth every 6 (six) hours as needed., Disp: , Rfl:    augmented betamethasone dipropionate (DIPROLENE-AF) 0.05 % cream, Apply 1 Application topically 2 (two) times daily as needed (flare up of itching skin on legs)., Disp: , Rfl:    Cholecalciferol 50 MCG (2000 UT) TABS, Take 1 tablet by mouth daily., Disp: , Rfl:    Lancet Device MISC, Test BGs once daily as needed E11.9 (one touch verio reflect), Disp: 100 each, Rfl: 12   levocetirizine (XYZAL ) 5 MG tablet, TAKE 1 TABLET (5 MG TOTAL) BY MOUTH AT BEDTIME AS NEEDED FOR ALLERGIES. FOR ALLERGIES/ DRAINAGE, Disp: 90 tablet, Rfl: 3   tiZANidine  (ZANAFLEX ) 4 MG tablet, Take 1 tablet (4 mg total) by mouth every 8 (eight) hours as needed for muscle spasms., Disp: 30 tablet, Rfl: 0   azelastine   (ASTELIN ) 0.1 % nasal spray, Place 1 spray into both nostrils 2 (two) times daily. (Patient not taking: Reported on 01/27/2024), Disp: 30 mL, Rfl: 0   empagliflozin  (JARDIANCE ) 25 MG TABS tablet, Take 1 tablet (25 mg total) by mouth daily before breakfast., Disp: 100 tablet, Rfl: 3   fenofibrate  160 MG tablet, TAKE 1 TABLET (160 MG TOTAL) DAILY BY MOUTH., Disp: 100 tablet, Rfl: 3   glucose blood (ONETOUCH VERIO) test strip, TEST BLOOD GLUCOSE DAILY AS NEEDED E11.9 (ONE TOUCH VERIO REFLECT), Disp: 100 strip, Rfl: 3   rosuvastatin  (CRESTOR ) 10 MG tablet, Take 1 tablet (10 mg total) by mouth daily., Disp: 100 tablet, Rfl: 3  No Known Allergies   ROS: Review of Systems Pertinent items noted in HPI and remainder of comprehensive ROS otherwise negative.    Physical exam BP 135/84   Pulse 75   Temp 98.2 F (36.8 C)   Ht 5' 2 (1.575 m)   Wt 161 lb 4 oz (73.1 kg)   SpO2 95%   BMI 29.49 kg/m  General appearance: alert, cooperative, appears stated age, and no distress Head: Normocephalic, without obvious abnormality, atraumatic Eyes: negative findings: lids and lashes normal, conjunctivae and sclerae normal, corneas clear, and pupils equal, round, reactive to light and accomodation Ears: normal TM's and external ear canals both ears Nose: Nares normal. Septum midline. Mucosa normal. No drainage or sinus tenderness. Throat: lips, mucosa, and tongue normal; teeth and gums normal Neck: no adenopathy, no carotid bruit, supple, symmetrical, trachea midline, and thyroid  not enlarged, symmetric, no tenderness/mass/nodules Back: symmetric, no curvature. ROM normal. No CVA tenderness. Lungs: clear to auscultation bilaterally Heart: regular rate and rhythm, S1, S2 normal, no murmur, click, rub or gallop Abdomen: soft, non-tender; bowel sounds normal; no masses,  no organomegaly Extremities: extremities normal, atraumatic, no cyanosis or edema Pulses: 2+ and symmetric Skin: Skin color, texture, turgor  normal. No rashes or lesions Lymph nodes: No supraclavicular or anterior cervical lymph node enlargement Neurologic: Alert and oriented X 3, normal strength and tone. Normal symmetric reflexes. Normal coordination and gait      01/27/2024   10:10 AM 09/23/2023    3:01 PM 09/06/2023   11:31 AM  Depression screen PHQ 2/9  Decreased Interest 0 0 0  Down, Depressed, Hopeless 0 0 0  PHQ - 2 Score 0 0 0  Altered sleeping 0 0 0  Tired, decreased energy 0 0 0  Change in appetite 0 0 0  Feeling bad or failure about yourself  0 0 0  Trouble concentrating 0 0 0  Moving slowly or fidgety/restless 0 0 0  Suicidal thoughts 0 0 0  PHQ-9 Score 0 0  0   Difficult doing work/chores Not difficult at all Not difficult at all Not difficult at all     Data saved with a previous flowsheet row definition      01/27/2024   10:11 AM 09/23/2023    3:01 PM 07/15/2023    8:48 AM 05/14/2023    2:52 PM  GAD 7 : Generalized Anxiety Score  Nervous, Anxious, on Edge 0 0 0 0  Control/stop worrying 0 0 0 0  Worry too much - different things 0 0 0 0  Trouble relaxing 0 0 0 0  Restless 0 0 0 0  Easily annoyed or irritable 0 0 0 0  Afraid - awful might happen 0 0 0 0  Total GAD 7 Score 0 0 0 0  Anxiety Difficulty Not difficult at all Not difficult at all Not difficult at all     Diabetic Foot Exam - Simple   Simple Foot Form Diabetic Foot exam was performed with the following findings: Yes 01/27/2024 10:30 AM  Visual Inspection No deformities, no ulcerations, no other skin breakdown bilaterally: Yes Sensation Testing Intact to touch and monofilament testing bilaterally: Yes Pulse Check Posterior Tibialis and Dorsalis pulse intact bilaterally: Yes Comments       Assessment/ Plan: [name] here for annual physical exam.   Annual physical exam  Diabetes mellitus treated with injections of non-insulin medication (HCC) - Plan: CMP14+EGFR, Microalbumin / creatinine urine ratio, Bayer DCA Hb A1c  Waived, CBC with  Differential, empagliflozin  (JARDIANCE ) 25 MG TABS tablet, glucose blood (ONETOUCH VERIO) test strip, Semaglutide ,0.25 or 0.5MG /DOS, (OZEMPIC , 0.25 OR 0.5 MG/DOSE,) 2 MG/3ML SOPN  Hyperlipidemia associated with type 2 diabetes mellitus (HCC) - Plan: CMP14+EGFR, Lipid Panel, fenofibrate  160 MG tablet, rosuvastatin  (CRESTOR ) 10 MG tablet  BMI 29.0-29.9,adult  Bilateral hearing loss, unspecified hearing loss type - Plan: Ambulatory referral to Audiology   She declined all vaccinations today.    Check fasting labs.  Okay to switch from Jardiance  to Ozempic  starting January 1.  Urine microalbumin collected today.  Diabetic foot exam performed  Continue statin, fenofibrate   Continue lifestyle modification to reduce weight and improve cardiovascular health  Referral to audiology for hearing loss.  Discussed association with untreated hearing loss and dementia.  Counseled on healthy lifestyle choices, including diet (rich in fruits, vegetables and lean meats and low in salt and simple carbohydrates) and exercise (at least 30 minutes of moderate physical activity daily).  Patient to follow up 18m for DM  Shawntee Mainwaring M. Jolinda, DO

## 2024-01-28 ENCOUNTER — Ambulatory Visit: Payer: Self-pay | Admitting: Family Medicine

## 2024-01-28 ENCOUNTER — Encounter: Payer: Self-pay | Admitting: Family Medicine

## 2024-01-28 LAB — CMP14+EGFR
ALT: 28 IU/L (ref 0–32)
AST: 38 IU/L (ref 0–40)
Albumin: 4.6 g/dL (ref 3.8–4.8)
Alkaline Phosphatase: 77 IU/L (ref 49–135)
BUN/Creatinine Ratio: 24 (ref 12–28)
BUN: 23 mg/dL (ref 8–27)
Bilirubin Total: 0.8 mg/dL (ref 0.0–1.2)
CO2: 20 mmol/L (ref 20–29)
Calcium: 10 mg/dL (ref 8.7–10.3)
Chloride: 104 mmol/L (ref 96–106)
Creatinine, Ser: 0.95 mg/dL (ref 0.57–1.00)
Globulin, Total: 2.5 g/dL (ref 1.5–4.5)
Glucose: 98 mg/dL (ref 70–99)
Potassium: 4.4 mmol/L (ref 3.5–5.2)
Sodium: 141 mmol/L (ref 134–144)
Total Protein: 7.1 g/dL (ref 6.0–8.5)
eGFR: 63 mL/min/1.73 (ref >59)

## 2024-01-28 LAB — CBC WITH DIFFERENTIAL/PLATELET
Basophils Absolute: 0.1 x10E3/uL (ref 0.0–0.2)
Basos: 1 %
EOS (ABSOLUTE): 0.2 x10E3/uL (ref 0.0–0.4)
Eos: 2 %
Hematocrit: 50.3 % — ABNORMAL HIGH (ref 34.0–46.6)
Hemoglobin: 16.8 g/dL — ABNORMAL HIGH (ref 11.1–15.9)
Immature Grans (Abs): 0 x10E3/uL (ref 0.0–0.1)
Immature Granulocytes: 0 %
Lymphocytes Absolute: 2.7 x10E3/uL (ref 0.7–3.1)
Lymphs: 35 %
MCH: 32.1 pg (ref 26.6–33.0)
MCHC: 33.4 g/dL (ref 31.5–35.7)
MCV: 96 fL (ref 79–97)
Monocytes Absolute: 0.4 x10E3/uL (ref 0.1–0.9)
Monocytes: 5 %
Neutrophils Absolute: 4.5 x10E3/uL (ref 1.4–7.0)
Neutrophils: 57 %
Platelets: 205 x10E3/uL (ref 150–450)
RBC: 5.23 x10E6/uL (ref 3.77–5.28)
RDW: 11.8 % (ref 11.7–15.4)
WBC: 7.8 x10E3/uL (ref 3.4–10.8)

## 2024-01-28 LAB — LIPID PANEL
Cholesterol, Total: 140 mg/dL (ref 100–199)
HDL: 43 mg/dL (ref >39)
LDL CALC COMMENT:: 3.3 ratio (ref 0.0–4.4)
LDL Chol Calc (NIH): 67 mg/dL (ref 0–99)
Triglycerides: 175 mg/dL — AB (ref 0–149)
VLDL Cholesterol Cal: 30 mg/dL (ref 5–40)

## 2024-01-28 LAB — MICROALBUMIN / CREATININE URINE RATIO
Creatinine, Urine: 76.6 mg/dL
Microalb/Creat Ratio: 5 mg/g{creat} (ref 0–29)
Microalbumin, Urine: 3.6 ug/mL

## 2024-04-01 ENCOUNTER — Encounter: Payer: Self-pay | Admitting: Family Medicine

## 2024-04-01 NOTE — Telephone Encounter (Signed)
 Patient returned call. Read note as written. Scheduled appt for 6/5.

## 2024-04-02 ENCOUNTER — Encounter: Payer: Self-pay | Admitting: *Deleted

## 2024-04-02 NOTE — Progress Notes (Signed)
 MASSA PE                                          MRN: [id number]   04/02/2024   The [facility] Quality Team Specialist reviewed this patient medical record for the purposes of chart review for care gap closure. The following were reviewed: abstraction for care gap closure-kidney health evaluation for diabetes:eGFR  and uACR.    [facility] Quality Team

## 2024-07-31 ENCOUNTER — Ambulatory Visit: Admitting: Family Medicine

## 2024-09-09 ENCOUNTER — Ambulatory Visit: Payer: Self-pay
